# Patient Record
Sex: Male | Born: 1935 | Race: White | Hispanic: No | Smoking: Former smoker
Health system: Southern US, Community
[De-identification: ages and names within clinical notes are randomized; demographics above are authoritative.]

## PROBLEM LIST (undated history)

## (undated) DIAGNOSIS — I2510 Atherosclerotic heart disease of native coronary artery without angina pectoris: Secondary | ICD-10-CM

## (undated) DIAGNOSIS — Z8719 Personal history of other diseases of the digestive system: Secondary | ICD-10-CM

## (undated) DIAGNOSIS — E785 Hyperlipidemia, unspecified: Secondary | ICD-10-CM

## (undated) DIAGNOSIS — E039 Hypothyroidism, unspecified: Secondary | ICD-10-CM

## (undated) DIAGNOSIS — D649 Anemia, unspecified: Secondary | ICD-10-CM

## (undated) DIAGNOSIS — Q120 Congenital cataract: Secondary | ICD-10-CM

## (undated) DIAGNOSIS — C679 Malignant neoplasm of bladder, unspecified: Secondary | ICD-10-CM

## (undated) DIAGNOSIS — J439 Emphysema, unspecified: Secondary | ICD-10-CM

## (undated) DIAGNOSIS — I10 Essential (primary) hypertension: Secondary | ICD-10-CM

## (undated) HISTORY — PX: TURP VAPORIZATION: SUR1397

## (undated) HISTORY — DX: Hypothyroidism, unspecified: E03.9

## (undated) HISTORY — DX: Malignant neoplasm of bladder, unspecified: C67.9

## (undated) HISTORY — PX: BLADDER SURGERY: SHX569

## (undated) HISTORY — PX: TONSILLECTOMY: SUR1361

---

## 2006-07-13 ENCOUNTER — Ambulatory Visit: Payer: Self-pay | Admitting: Cardiology

## 2006-07-24 ENCOUNTER — Ambulatory Visit: Payer: Self-pay | Admitting: Cardiology

## 2006-10-03 ENCOUNTER — Ambulatory Visit: Payer: Self-pay | Admitting: Cardiology

## 2006-10-09 ENCOUNTER — Ambulatory Visit: Payer: Self-pay | Admitting: Cardiology

## 2006-10-15 ENCOUNTER — Inpatient Hospital Stay (HOSPITAL_BASED_OUTPATIENT_CLINIC_OR_DEPARTMENT_OTHER): Admission: RE | Admit: 2006-10-15 | Discharge: 2006-10-15 | Payer: Self-pay | Admitting: Cardiology

## 2006-10-15 ENCOUNTER — Ambulatory Visit: Payer: Self-pay | Admitting: Cardiology

## 2006-10-30 ENCOUNTER — Ambulatory Visit: Payer: Self-pay | Admitting: Cardiology

## 2006-11-20 HISTORY — PX: CARDIAC CATHETERIZATION: SHX172

## 2006-11-28 ENCOUNTER — Ambulatory Visit (HOSPITAL_COMMUNITY): Admission: RE | Admit: 2006-11-28 | Discharge: 2006-11-28 | Payer: Self-pay | Admitting: Cardiology

## 2006-11-28 ENCOUNTER — Ambulatory Visit: Payer: Self-pay | Admitting: Internal Medicine

## 2006-12-04 ENCOUNTER — Ambulatory Visit: Payer: Self-pay | Admitting: Cardiology

## 2007-01-22 ENCOUNTER — Ambulatory Visit: Payer: Self-pay | Admitting: Critical Care Medicine

## 2007-03-07 ENCOUNTER — Ambulatory Visit: Payer: Self-pay | Admitting: Critical Care Medicine

## 2007-08-23 ENCOUNTER — Encounter: Payer: Self-pay | Admitting: Cardiology

## 2007-10-10 DIAGNOSIS — J439 Emphysema, unspecified: Secondary | ICD-10-CM | POA: Insufficient documentation

## 2007-10-10 DIAGNOSIS — J438 Other emphysema: Secondary | ICD-10-CM | POA: Insufficient documentation

## 2008-01-01 ENCOUNTER — Ambulatory Visit: Payer: Self-pay | Admitting: Cardiology

## 2008-01-10 ENCOUNTER — Ambulatory Visit: Payer: Self-pay | Admitting: Critical Care Medicine

## 2008-01-10 DIAGNOSIS — E785 Hyperlipidemia, unspecified: Secondary | ICD-10-CM | POA: Insufficient documentation

## 2008-01-10 DIAGNOSIS — I2510 Atherosclerotic heart disease of native coronary artery without angina pectoris: Secondary | ICD-10-CM | POA: Insufficient documentation

## 2008-01-10 DIAGNOSIS — C679 Malignant neoplasm of bladder, unspecified: Secondary | ICD-10-CM | POA: Insufficient documentation

## 2008-01-24 ENCOUNTER — Ambulatory Visit: Payer: Self-pay | Admitting: Critical Care Medicine

## 2008-01-30 ENCOUNTER — Encounter: Payer: Self-pay | Admitting: Critical Care Medicine

## 2008-02-24 ENCOUNTER — Ambulatory Visit: Payer: Self-pay | Admitting: Critical Care Medicine

## 2008-08-25 ENCOUNTER — Ambulatory Visit: Payer: Self-pay | Admitting: Cardiology

## 2009-02-03 ENCOUNTER — Ambulatory Visit: Payer: Self-pay | Admitting: Critical Care Medicine

## 2009-03-18 ENCOUNTER — Ambulatory Visit: Payer: Self-pay | Admitting: Critical Care Medicine

## 2009-03-25 ENCOUNTER — Encounter: Payer: Self-pay | Admitting: Cardiology

## 2009-03-29 ENCOUNTER — Ambulatory Visit: Payer: Self-pay | Admitting: Cardiology

## 2009-06-16 ENCOUNTER — Ambulatory Visit: Payer: Self-pay | Admitting: Critical Care Medicine

## 2009-09-06 DIAGNOSIS — I10 Essential (primary) hypertension: Secondary | ICD-10-CM | POA: Insufficient documentation

## 2009-10-22 ENCOUNTER — Ambulatory Visit: Payer: Self-pay | Admitting: Critical Care Medicine

## 2009-10-25 ENCOUNTER — Encounter: Payer: Self-pay | Admitting: Critical Care Medicine

## 2009-10-27 ENCOUNTER — Encounter: Payer: Self-pay | Admitting: Cardiology

## 2010-02-02 ENCOUNTER — Ambulatory Visit: Payer: Self-pay | Admitting: Critical Care Medicine

## 2010-04-20 ENCOUNTER — Ambulatory Visit: Payer: Self-pay | Admitting: Cardiology

## 2010-04-20 DIAGNOSIS — B354 Tinea corporis: Secondary | ICD-10-CM | POA: Insufficient documentation

## 2010-06-06 ENCOUNTER — Ambulatory Visit: Payer: Self-pay | Admitting: Critical Care Medicine

## 2010-08-04 ENCOUNTER — Ambulatory Visit (HOSPITAL_COMMUNITY): Admission: RE | Admit: 2010-08-04 | Discharge: 2010-08-04 | Payer: Self-pay | Admitting: Urology

## 2010-12-02 ENCOUNTER — Ambulatory Visit
Admission: RE | Admit: 2010-12-02 | Discharge: 2010-12-02 | Payer: Self-pay | Source: Home / Self Care | Attending: Critical Care Medicine | Admitting: Critical Care Medicine

## 2010-12-20 NOTE — Assessment & Plan Note (Signed)
Summary: 1 YR FU -RECV REMINDER VS   Visit Type:  Follow-up Referring Provider:  [name] Primary Provider:  Leandrew Koyanagi   History of Present Illness: the patient is a 77 year old male with COPD and bladder cancer, nonobstructive coronary disease and normal LV function. The patient presents for followup. He denies any chest pain. He has chronic dyspneaGOLD III. he reports a rash on the chest in associated with itching. He had recent blood work done and reportedly his cholesterol is within normal limits. He reports no other cardiovascular symptoms  Bystolic was apparently switched to carvedilol.apparently the patient tolerated this change without any difficulty.  Preventive Screening-Counseling & Management  Alcohol-Tobacco     Smoking Status: quit     Year Quit: 1998  Current Medications (verified): 1)  Ventolin Hfa 108 (90 Base) Mcg/act Aers (Albuterol Sulfate) .... Inhale 2 Puff Every Four  Hours As Needed 2)  Levothroid 112 Mcg Tabs (Levothyroxine Sodium) .... Once Daily 3)  Lovastatin 20 Mg  Tabs (Lovastatin) .... One By Mouth Once Daily 4)  Adult Aspirin Low Strength 81 Mg  Tbdp (Aspirin) .... One By Mouth Once Daily 5)  Carvedilol 12.5 Mg Tabs (Carvedilol) .... One By Mouth Two Times A Day   Pt Stopping Bystolic 6)  Advair Diskus 250-50 Mcg/dose Misc (Fluticasone-Salmeterol) .Marland Kitchen.. 1 Puff Two Times A Day 7)  Amlodipine Besylate 5 Mg Tabs (Amlodipine Besylate) .... Take 1 Tablet By Mouth Once A Day 8)  Timolol Maleate 0.5 % Soln (Timolol Maleate) .... As Needed 9)  Pred Forte 1 % Susp (Prednisolone Acetate) .... As Needed 10)  Isopto Hyoscine 0.25 % Soln (Scopolamine Hbr) .... As Needed  Allergies: 1)  ! Penicillin  Comments:  Nurse/Medical Assistant: The patient's medications and allergies were reviewed with the patient and were updated in the Medication and Allergy Lists. List reviewed.  Past History:  Past Medical History: Last updated: 09/06/2009 HYPERTENSION,  UNSPECIFIED (ICD-401.9) DYSPNEA (ICD-786.05) ACUTE BRONCHITIS (ICD-466.0) OTHER ACUTE SINUSITIS (ICD-461.8) PNEUMONIA (ICD-486) BLADDER CANCER (ICD-188.9) HYPERLIPIDEMIA (ICD-272.4) CORONARY HEART DISEASE (ICD-414.00) EMPHYSEMA (ICD-492.8) C O P D (ICD-496) Hypothyroidism.  Past Surgical History: Last updated: 06/16/2009 Bladder Surgery 2008 TURP  2008  Family History: Last updated: 01/10/2008 non contrib  Social History: Last updated: 09/06/2009 Patient states former smoker.  Retired  Married   Social History: Smoking Status:  quit  Review of Systems       The patient complains of shortness of breath and prolonged cough.  The patient denies fatigue, malaise, fever, weight gain/loss, vision loss, decreased hearing, hoarseness, chest pain, palpitations, wheezing, sleep apnea, coughing up blood, abdominal pain, blood in stool, nausea, vomiting, diarrhea, heartburn, incontinence, blood in urine, muscle weakness, joint pain, leg swelling, rash, skin lesions, headache, fainting, dizziness, depression, anxiety, enlarged lymph nodes, easy bruising or bleeding, and environmental allergies.    Vital Signs:  Patient profile:   77 year old male Height:      67 inches Weight:      160 pounds Pulse rate:   67 / minute BP sitting:   120 / 69  (left arm) Cuff size:   regular  Vitals Entered By: Carlye Grippe (April 20, 2010 10:43 AM)  Physical Exam  Additional Exam:  General: Well-developed, well-nourished in no distress head: Normocephalic and atraumatic eyes PERRLA/EOMI intact, conjunctiva and lids normal nose: No deformity or lesions mouth normal dentition, normal posterior pharynx neck: Supple, no JVD.  No masses, thyromegaly or abnormal cervical nodes lungs: Normal breath sounds bilaterally without wheezing.  Normal percussion heart: regular rate and rhythm with normal S1 and S2, no S3 or S4.  PMI is normal.  No pathological murmurs abdomen: Normal bowel sounds, abdomen  is soft and nontender without masses, organomegaly or hernias noted.  No hepatosplenomegaly musculoskeletal: Back normal, normal gait muscle strength and tone normal pulsus: Pulse is normal in all 4 extremities Extremities: No peripheral pitting edema neurologic: Alert and oriented x 3 skin: Norlene Duel consistent with dermatophytosis on the chest cervical nodes: No significant adenopathy psychologic: Normal affect    EKG  Procedure date:  04/20/2010  Findings:      normal sinus rhythm. Left anterior hemiblock. Heart rate 65 beats per minute  Impression & Recommendations:  Problem # 1:  HYPERTENSION, UNSPECIFIED (ICD-401.9) Assessment Comment Only  His updated medication list for this problem includes:    Adult Aspirin Low Strength 81 Mg Tbdp (Aspirin) ..... One by mouth once daily    Carvedilol 12.5 Mg Tabs (Carvedilol) ..... One by mouth two times a day   pt stopping bystolic    Amlodipine Besylate 5 Mg Tabs (Amlodipine besylate) .Marland Kitchen... Take 1 tablet by mouth once a day  Problem # 2:  DYSPNEA (ICD-786.05) the patient's chronic COPD with frequent exacerbations he is followed by pulmonary His updated medication list for this problem includes:    Adult Aspirin Low Strength 81 Mg Tbdp (Aspirin) ..... One by mouth once daily    Carvedilol 12.5 Mg Tabs (Carvedilol) ..... One by mouth two times a day   pt stopping bystolic    Amlodipine Besylate 5 Mg Tabs (Amlodipine besylate) .Marland Kitchen... Take 1 tablet by mouth once a day  Problem # 3:  CORONARY HEART DISEASE (ICD-414.00) the patient has nonobstructive coronary artery disease and reports no chest pain. No indication for ischemia testing. His updated medication list for this problem includes:    Adult Aspirin Low Strength 81 Mg Tbdp (Aspirin) ..... One by mouth once daily    Carvedilol 12.5 Mg Tabs (Carvedilol) ..... One by mouth two times a day   pt stopping bystolic    Amlodipine Besylate 5 Mg Tabs (Amlodipine besylate) .Marland Kitchen... Take 1 tablet by  mouth once a day  Orders: EKG w/ Interpretation (93000)  Problem # 4:  TINEA CORPORIS (ICD-110.5) prescribed Tinactin powder  Patient Instructions: 1)  Your physician recommends that you continue on your current medications as directed. Please refer to the Current Medication list given to you today. 2)  Follow up in  1 year

## 2010-12-20 NOTE — Assessment & Plan Note (Signed)
Summary: Pulmonary OV   Copy to:  [name] Primary Provider/Referring Provider:  Leandrew Koyanagi  CC:  4 month follow up.  Pt states breathing is the same since last OV.  States he is still having SOB with exertion but this is no worse.  Occ wheezing and occ prod cough with clear mucus.  .  History of Present Illness: This is a 77 year old, white male with history of chronic obstructive lung disease.   He has had bladder cancer with tumors removed from the bladder.  Prostatic hypertrophy surgery is also been performed.  Both of these surgeries were done  at [place],  [place] by urology at that site.    June 06, 2010 12:02 PM No real change since march.  Dyspnea is the same.  No new issues. Pt denies any significant sore throat, nasal congestion or excess secretions, fever, chills, sweats, unintended weight loss, pleurtic or exertional chest pain, orthopnea PND, or leg swelling Pt denies any increase in rescue therapy over baseline, denies waking up needing it or having any early am or nocturnal exacerbations of coughing/wheezing/or dyspnea.   Preventive Screening-Counseling & Management  Alcohol-Tobacco     Smoking Status: quit     Packs/Day: 30     Year Quit: 1998     Pack years: 13yrs, 1 1/2ppd  Current Medications (verified): 1)  Ventolin Hfa 108 (90 Base) Mcg/act Aers (Albuterol Sulfate) .... Inhale 2 Puff Every Four  Hours As Needed 2)  Levothroid 112 Mcg Tabs (Levothyroxine Sodium) .... Once Daily 3)  Lovastatin 20 Mg  Tabs (Lovastatin) .... One By Mouth Once Daily 4)  Adult Aspirin Low Strength 81 Mg  Tbdp (Aspirin) .... One By Mouth Once Daily 5)  Carvedilol 12.5 Mg Tabs (Carvedilol) .... One By Mouth Two Times A Day   Pt Stopping Bystolic 6)  Advair Diskus 250-50 Mcg/dose Misc (Fluticasone-Salmeterol) .Marland Kitchen.. 1 Puff Two Times A Day 7)  Amlodipine Besylate 5 Mg Tabs (Amlodipine Besylate) .... Take 1 Tablet By Mouth Once A Day 8)  Timolol Maleate 0.5 % Soln (Timolol Maleate) ....  As Needed 9)  Pred Forte 1 % Susp (Prednisolone Acetate) .... As Needed 10)  Isopto Hyoscine 0.25 % Soln (Scopolamine Hbr) .... As Needed  Allergies (verified): 1)  ! Penicillin  Past History:  Past medical, surgical, family and social histories (including risk factors) reviewed, and no changes noted (except as noted below).  Past Medical History: Reviewed history from 09/06/2009 and no changes required. HYPERTENSION, UNSPECIFIED (ICD-401.9) DYSPNEA (ICD-786.05) ACUTE BRONCHITIS (ICD-466.0) OTHER ACUTE SINUSITIS (ICD-461.8) PNEUMONIA (ICD-486) BLADDER CANCER (ICD-188.9) HYPERLIPIDEMIA (ICD-272.4) CORONARY HEART DISEASE (ICD-414.00) EMPHYSEMA (ICD-492.8) C O P D (ICD-496) Hypothyroidism.  Past Surgical History: Reviewed history from 06/16/2009 and no changes required. Bladder Surgery 2008 TURP  2008  Family History: Reviewed history from 01/10/2008 and no changes required. non contrib  Social History: Reviewed history from 09/06/2009 and no changes required. Patient states former smoker.  Retired  Married   Review of Systems       The patient complains of shortness of breath with activity.  The patient denies shortness of breath at rest, productive cough, non-productive cough, coughing up blood, chest pain, irregular heartbeats, acid heartburn, indigestion, loss of appetite, weight change, abdominal pain, difficulty swallowing, sore throat, tooth/dental problems, headaches, nasal congestion/difficulty breathing through nose, sneezing, itching, ear ache, anxiety, depression, hand/feet swelling, joint stiffness or pain, rash, change in color of mucus, and fever.    Vital Signs:  Patient profile:   77 year  old male Height:      66 inches Weight:      163.25 pounds BMI:     26.44 O2 Sat:      92 % on Room air Temp:     98.3 degrees F oral Pulse rate:   86 / minute BP sitting:   104 / 82  (right arm) Cuff size:   regular  Vitals Entered By: Gweneth Dimitri RN (June 06, 2010 11:42 AM)  O2 Flow:  Room air CC: 4 month follow up.  Pt states breathing is the same since last OV.  States he is still having SOB with exertion but this is no worse.  Occ wheezing and occ prod cough with clear mucus.   Comments Medications reviewed with patient Daytime contact number verified with patient. Gweneth Dimitri RN  June 06, 2010 11:44 AM    Physical Exam  Additional Exam:  Gen: Pleasant, well-nourished, in no distress , normal affect ENT: no lesions, no post nasal drip Neck: No JVD, no TMG, no carotid bruits Lungs: No use of accessory muscles, no dullness to percussion, distant BS poor airflow Cardiovascular: RRR, heart sounds normal, no murmurs or gallops, no peripheral edema Abdomen: soft and non-tender, no HSM, BS normal Musculoskeletal: No deformities, no cyanosis or clubbing Neuro: alert, non-focal     Impression & Recommendations:  Problem # 1:  C O P D (ICD-496) Assessment Unchanged  Stable COPD Golds stage III, no change with bystolic vs coreg in dypsnea  plan cont coreg No change in inhaled medications.   Maintain treatment program as currently prescribed.  Medications Added to Medication List This Visit: 1)  Proventil Hfa 108 (90 Base) Mcg/act Aers (Albuterol sulfate) .Marland Kitchen.. 1-2 puffs every 4-6 hours as needed  Complete Medication List: 1)  Proventil Hfa 108 (90 Base) Mcg/act Aers (Albuterol sulfate) .Marland Kitchen.. 1-2 puffs every 4-6 hours as needed 2)  Levothroid 112 Mcg Tabs (Levothyroxine sodium) .... Once daily 3)  Lovastatin 20 Mg Tabs (Lovastatin) .... One by mouth once daily 4)  Adult Aspirin Low Strength 81 Mg Tbdp (Aspirin) .... One by mouth once daily 5)  Carvedilol 12.5 Mg Tabs (Carvedilol) .... One by mouth two times a day   pt stopping bystolic 6)  Advair Diskus 250-50 Mcg/dose Misc (Fluticasone-salmeterol) .Marland Kitchen.. 1 puff two times a day 7)  Amlodipine Besylate 5 Mg Tabs (Amlodipine besylate) .... Take 1 tablet by mouth once a day 8)  Timolol  Maleate 0.5 % Soln (Timolol maleate) .... As needed 9)  Pred Forte 1 % Susp (Prednisolone acetate) .... As needed 10)  Isopto Hyoscine 0.25 % Soln (Scopolamine hbr) .... As needed  Other Orders: Est. Patient Level III (30865)  Patient Instructions: 1)  No change in medications 2)  Return in     6     months Prescriptions: PROVENTIL HFA 108 (90 BASE) MCG/ACT  AERS (ALBUTEROL SULFATE) 1-2 puffs every 4-6 hours as needed  #1 x 6   Entered and Authorized by:   Storm Frisk MD   Signed by:   Storm Frisk MD on 06/06/2010   Method used:   Electronically to        [facility]  [address] [address] [id number] (retail)       [address]       [place], [place]  [address]       Ph: [phone] or [phone]       Fax:  [phone]   RxID:   [id number] ADVAIR DISKUS 250-50 MCG/DOSE MISC (FLUTICASONE-SALMETEROL) 1 puff two times a day  #1 x 6   Entered and Authorized by:   Storm Frisk MD   Signed by:   Storm Frisk MD on 06/06/2010   Method used:   Electronically to        [facility]  [address] [address] [id number] (retail)       [address]       [place], [place]  [address]       Ph: [phone] or [phone]       Fax: [phone]   RxID:   [id number]     Appended Document: Pulmonary OV fax Dr Leandrew Koyanagi

## 2010-12-20 NOTE — Assessment & Plan Note (Signed)
Summary: Pulmonary OV   Copy to:  Willie Williamson Primary Provider/Referring Provider:  [facility]  CC:  COPD. 2 mo follow-up. The patient says there is no change in his breathing. He has sob with exertion but no worse. He c/o a cough with yellow mucus x2-3 days.Willie Williamson  History of Present Illness: This is a 77 year old, white male with history of chronic obstructive lung disease.   He has had bladder cancer with tumors removed from the bladder.  Prostatic hypertrophy surgery is also been performed.  Both of these surgeries were done  at [place],  [place] by urology at that site.    February 02, 2010 10:51 AM The states when he tried bystolic was no difference is dyspnea The pt is on advair  The pt notes congestion last few days with yellow green tint. No other new issues.      Preventive Screening-Counseling & Management  Alcohol-Tobacco     Smoking Status: quit > 6 months  Current Medications (verified): 1)  Ventolin Hfa 108 (90 Base) Mcg/act Aers (Albuterol Sulfate) .... Inhale 2 Puff Every Four  Hours As Needed 2)  Levothroid 112 Mcg Tabs (Levothyroxine Sodium) .... Once Daily 3)  Amlodipine Besylate 5 Mg Tabs (Amlodipine Besylate) .... One By Mouth Daily 4)  Lovastatin 20 Mg  Tabs (Lovastatin) .... One By Mouth Once Daily 5)  Adult Aspirin Low Strength 81 Mg  Tbdp (Aspirin) .... One By Mouth Once Daily 6)  Bystolic 10 Mg  Tabs (Nebivolol Hcl) .... One Tablet Daily 7)  Advair Diskus 250-50 Mcg/dose Misc (Fluticasone-Salmeterol) .Willie Williamson.. 1 Puff Two Times A Day  Allergies (verified): 1)  ! Penicillin  Past History:  Past medical, surgical, family and social histories (including risk factors) reviewed, and no changes noted (except as noted below).  Past Medical History: Reviewed history from 09/06/2009 and no changes required. HYPERTENSION, UNSPECIFIED (ICD-401.9) DYSPNEA (ICD-786.05) ACUTE BRONCHITIS (ICD-466.0) OTHER ACUTE SINUSITIS (ICD-461.8) PNEUMONIA  (ICD-486) BLADDER CANCER (ICD-188.9) HYPERLIPIDEMIA (ICD-272.4) CORONARY HEART DISEASE (ICD-414.00) EMPHYSEMA (ICD-492.8) C O P D (ICD-496) Hypothyroidism.  Past Surgical History: Reviewed history from 06/16/2009 and no changes required. Bladder Surgery 2008 TURP  2008  Family History: Reviewed history from 01/10/2008 and no changes required. non contrib  Social History: Reviewed history from 09/06/2009 and no changes required. Patient states former smoker.  Retired  Married   Review of Systems       The patient complains of shortness of breath with activity and non-productive cough.  The patient denies shortness of breath at rest, productive cough, coughing up blood, chest pain, irregular heartbeats, acid heartburn, indigestion, loss of appetite, weight change, abdominal pain, difficulty swallowing, sore throat, tooth/dental problems, headaches, nasal congestion/difficulty breathing through nose, sneezing, itching, ear ache, anxiety, depression, hand/feet swelling, joint stiffness or pain, rash, change in color of mucus, and fever.    Vital Signs:  Patient profile:   77 year old male Height:      67 inches (170.18 cm) Weight:      165 pounds (75 kg) BMI:     25.94 O2 Sat:      96 % on Room air Temp:     97.7 degrees F (36.50 degrees C) oral Pulse rate:   85 / minute BP sitting:   130 / 74  (left arm) Cuff size:   regular  Vitals Entered By: Michel Bickers CMA (February 02, 2010 10:41 AM)  O2 Sat at Rest %:  96 O2 Flow:  Room air CC: COPD. 2 mo  follow-up. The patient says there is no change in his breathing. He has sob with exertion but no worse. He c/o a cough with yellow mucus x2-3 days. Comments Medications and phone number reviewed with the patient. Michel Bickers CMA  February 02, 2010 10:42 AM   Physical Exam  Additional Exam:  Gen: Pleasant, well-nourished, in no distress , normal affect ENT: no lesions, no post nasal drip Neck: No JVD, no TMG, no carotid bruits Lungs:  No use of accessory muscles, no dullness to percussion, distant BS poor airflow Cardiovascular: RRR, heart sounds normal, no murmurs or gallops, no peripheral edema Abdomen: soft and non-tender, no HSM, BS normal Musculoskeletal: No deformities, no cyanosis or clubbing Neuro: alert, non-focal     Impression & Recommendations:  Problem # 1:  C O P D (ICD-496) Assessment Unchanged  Stable COPD Golds stage III, no change with bystolic vs coreg in dypsnea,  mild tracheobronchitis   plan Take doxycycline one twice daily for 7 days Stop bystolic when current supply is out and switch back to coreg one twice daily No change in advair Return 4months  Medications Added to Medication List This Visit: 1)  Carvedilol 12.5 Mg Tabs (Carvedilol) .... One by mouth two times a day   pt stopping bystolic 2)  Doxycycline Hyclate 100 Mg Caps (Doxycycline hyclate) .... One by mouth two times a day  Complete Medication List: 1)  Ventolin Hfa 108 (90 Base) Mcg/act Aers (Albuterol sulfate) .... Inhale 2 puff every four  hours as needed 2)  Levothroid 112 Mcg Tabs (Levothyroxine sodium) .... Once daily 3)  Lovastatin 20 Mg Tabs (Lovastatin) .... One by mouth once daily 4)  Adult Aspirin Low Strength 81 Mg Tbdp (Aspirin) .... One by mouth once daily 5)  Carvedilol 12.5 Mg Tabs (Carvedilol) .... One by mouth two times a day   pt stopping bystolic 6)  Advair Diskus 250-50 Mcg/dose Misc (Fluticasone-salmeterol) .Willie Williamson.. 1 puff two times a day 7)  Doxycycline Hyclate 100 Mg Caps (Doxycycline hyclate) .... One by mouth two times a day  Other Orders: Est. Patient Level IV (36144)  Patient Instructions: 1)  Take doxycycline one twice daily for 7 days 2)  Stop bystolic when current supply is out and switch back to coreg one twice daily 3)  No change in advair 4)  Return 4months Prescriptions: ADVAIR DISKUS 250-50 MCG/DOSE MISC (FLUTICASONE-SALMETEROL) 1 puff two times a day  #1 x 6   Entered and Authorized by:    Storm Frisk MD   Signed by:   Storm Frisk MD on 02/02/2010   Method used:   Electronically to        [facility] [phone]* (retail)       [address] [place], [place]  [address]       Ph: [phone] or [phone]       Fax: [phone]   RxID:   [id number] DOXYCYCLINE HYCLATE 100 MG CAPS (DOXYCYCLINE HYCLATE) one by mouth two times a day  #14 x 0   Entered and Authorized by:   Storm Frisk MD   Signed by:   Storm Frisk MD on 02/02/2010   Method used:   Electronically to        [facility] [phone]* (retail)       [address] [place]  [place], [place]  [address]       Ph: [phone] or [phone]       Fax: [phone]   RxID:   [id number] CARVEDILOL 12.5 MG TABS (CARVEDILOL) One by mouth two times a day   pt stopping bystolic  #60 x 6   Entered and Authorized by:   Storm Frisk MD   Signed by:   Storm Frisk MD on 02/02/2010   Method used:   Electronically to        [facility] [phone]* (retail)       [address] [place], [place]  [address]       Ph: [phone] or [phone]       Fax: [phone]   RxID:   [id number]     Appended Document: Pulmonary OV fax [facility]

## 2010-12-22 NOTE — Assessment & Plan Note (Signed)
Summary: Pulmonary OV   Copy to:  [name], Leanna Battles Eye MD Primary Provider/Referring Provider:  Leandrew Koyanagi  CC:  6 month follow up.  Pt states he does have SOB with exertion but no worse from last OV.  Occas wheezing and occas prod cough with clear mucus..  History of Present Illness: This is a 77 year old, white male with history of chronic obstructive lung disease.   He has had bladder cancer with tumors removed from the bladder.  Prostatic hypertrophy surgery is also been performed.  Both of these surgeries were done  at [place],  [place] by urology at that site.    June 06, 2010 12:02 PM No real change since march.  Dyspnea is the same.  No new issues. Pt denies any significant sore throat, nasal congestion or excess secretions, fever, chills, sweats, unintended weight loss, pleurtic or exertional chest pain, orthopnea PND, or leg swelling Pt denies any increase in rescue therapy over baseline, denies waking up needing it or having any early am or nocturnal exacerbations of coughing/wheezing/or dyspnea.   December 02, 2010 9:51 AM Dyspnea the same.  Has sl congestion for 2-3 days.  No real nasal drip.  No chest pain.   Using timolol as needed.  will use 2-3 days in a row every 2-3 weeks for eye pain. eye [facility]   Current Medications (verified): 1)  Proventil Hfa 108 (90 Base) Mcg/act  Aers (Albuterol Sulfate) .Marland Kitchen.. 1-2 Puffs Every 4-6 Hours As Needed 2)  Levothroid 112 Mcg Tabs (Levothyroxine Sodium) .... Once Daily 3)  Lovastatin 20 Mg  Tabs (Lovastatin) .... One By Mouth Once Daily 4)  Adult Aspirin Low Strength 81 Mg  Tbdp (Aspirin) .... One By Mouth Once Daily 5)  Carvedilol 12.5 Mg Tabs (Carvedilol) .... One By Mouth Two Times A Day   Pt Stopping Bystolic 6)  Advair Diskus 250-50 Mcg/dose Misc (Fluticasone-Salmeterol) .Marland Kitchen.. 1 Puff Two Times A Day 7)  Amlodipine Besylate 5 Mg Tabs (Amlodipine Besylate) .... Take 1 Tablet By Mouth Once A Day 8)  Timolol  Maleate 0.5 % Soln (Timolol Maleate) .... As Needed 9)  Isopto Hyoscine 0.25 % Soln (Scopolamine Hbr) .... As Needed  Allergies (verified): 1)  ! Penicillin  Past History:  Past medical, surgical, family and social histories (including risk factors) reviewed, and no changes noted (except as noted below).  Past Medical History: Reviewed history from 09/06/2009 and no changes required. HYPERTENSION, UNSPECIFIED (ICD-401.9) DYSPNEA (ICD-786.05) ACUTE BRONCHITIS (ICD-466.0) OTHER ACUTE SINUSITIS (ICD-461.8) PNEUMONIA (ICD-486) BLADDER CANCER (ICD-188.9) HYPERLIPIDEMIA (ICD-272.4) CORONARY HEART DISEASE (ICD-414.00) EMPHYSEMA (ICD-492.8) C O P D (ICD-496) Hypothyroidism.  Past Surgical History: Reviewed history from 06/16/2009 and no changes required. Bladder Surgery 2008 TURP  2008  Family History: Reviewed history from 01/10/2008 and no changes required. non contrib  Social History: Reviewed history from 09/06/2009 and no changes required. Patient states former smoker.  Retired  Married   Review of Systems       The patient complains of shortness of breath with activity.  The patient denies shortness of breath at rest, productive cough, non-productive cough, coughing up blood, chest pain, irregular heartbeats, acid heartburn, indigestion, loss of appetite, weight change, abdominal pain, difficulty swallowing, sore throat, tooth/dental problems, headaches, nasal congestion/difficulty breathing through nose, sneezing, itching, ear ache, anxiety, depression, hand/feet swelling, joint stiffness or pain, rash, change in color of mucus, and fever.    Vital Signs:  Patient profile:   77 year old male Height:  67 inches Weight:      160 pounds BMI:     25.15 O2 Sat:      95 % on Room air Temp:     98.1 degrees F oral Pulse rate:   68 / minute BP sitting:   140 / 70  (left arm) Cuff size:   regular  Vitals Entered By: Gweneth Dimitri RN (December 02, 2010 9:36 AM)  O2  Flow:  Room air CC: 6 month follow up.  Pt states he does have SOB with exertion but no worse from last OV.  Occas wheezing and occas prod cough with clear mucus. Comments Medications reviewed with patient Daytime contact number verified with patient. Gweneth Dimitri RN  December 02, 2010 9:37 AM    Physical Exam  Additional Exam:  Gen: Pleasant, well-nourished, in no distress , normal affect ENT: no lesions, no post nasal drip Neck: No JVD, no TMG, no carotid bruits Lungs: No use of accessory muscles, no dullness to percussion, distant BS poor airflow Cardiovascular: RRR, heart sounds normal, no murmurs or gallops, no peripheral edema Abdomen: soft and non-tender, no HSM, BS normal Musculoskeletal: No deformities, no cyanosis or clubbing Neuro: alert, non-focal     Impression & Recommendations:  Problem # 1:  EMPHYSEMA (ICD-492.8) Assessment Unchanged stable copd primary emphysema would like to see the pt stop using timolol due to bronchospasm plan  alert Eye MD re timolol issue No change in inhaled medications.   Maintain treatment program as currently prescribed.  Orders: Est. Patient Level III (46962)  Complete Medication List: 1)  Proventil Hfa 108 (90 Base) Mcg/act Aers (Albuterol sulfate) .Marland Kitchen.. 1-2 puffs every 4-6 hours as needed 2)  Levothroid 112 Mcg Tabs (Levothyroxine sodium) .... Once daily 3)  Lovastatin 20 Mg Tabs (Lovastatin) .... One by mouth once daily 4)  Adult Aspirin Low Strength 81 Mg Tbdp (Aspirin) .... One by mouth once daily 5)  Carvedilol 12.5 Mg Tabs (Carvedilol) .... One by mouth two times a day   pt stopping bystolic 6)  Advair Diskus 250-50 Mcg/dose Misc (Fluticasone-salmeterol) .Marland Kitchen.. 1 puff two times a day 7)  Amlodipine Besylate 5 Mg Tabs (Amlodipine besylate) .... Take 1 tablet by mouth once a day 8)  Timolol Maleate 0.5 % Soln (Timolol maleate) .... As needed 9)  Isopto Hyoscine 0.25 % Soln (Scopolamine hbr) .... As needed  Patient  Instructions: 1)  No change in medications 2)  Return in      6    months 3)  I will ask your eye MD to change timolol to a different eye drop due to lung disease  Prescriptions: ADVAIR DISKUS 250-50 MCG/DOSE MISC (FLUTICASONE-SALMETEROL) 1 puff two times a day  #1 x 6   Entered and Authorized by:   Storm Frisk MD   Signed by:   Storm Frisk MD on 12/02/2010   Method used:   Electronically to        [facility]  [address] [address] [id number] (retail)       [address]       [place], [place]  [address]       Ph: [phone] or [phone]       Fax: [phone]   RxID:   [id number]    Immunization History:  Influenza Immunization History:    Influenza:  historical (08/20/2010)  Pneumovax Immunization History:    Pneumovax:  historical (08/20/2010)   Prevention & Chronic Care  Immunizations   Influenza vaccine: Historical  (08/20/2010)    Tetanus booster: Not documented    Pneumococcal vaccine: Historical  (08/20/2010)    H. zoster vaccine: Not documented  Colorectal Screening   Hemoccult: Not documented    Colonoscopy: Not documented  Other Screening   PSA: Not documented   Smoking status: quit  (06/06/2010)  Lipids   Total Cholesterol: Not documented   LDL: Not documented   LDL Direct: Not documented   HDL: Not documented   Triglycerides: Not documented    SGOT (AST): Not documented   SGPT (ALT): Not documented   Alkaline phosphatase: Not documented   Total bilirubin: Not documented  Hypertension   Last Blood Pressure: 140 / 70  (12/02/2010)   Serum creatinine: Not documented   Serum potassium Not documented  Self-Management Support :    Hypertension self-management support: Not documented    Lipid self-management support: Not documented     Appended Document: Pulmonary OV fax Leanna Battles [place]

## 2011-04-04 NOTE — Assessment & Plan Note (Signed)
[facility]                          [facility]   JAMERIUS, BOECKMAN                       MRN:          [id number]  DATE:03/29/2009                            DOB:          1931/01/28    HISTORY OF PRESENT ILLNESS:  The patient is a 77 year old male with  history of chronic obstructive pulmonary disease with frequent  exacerbations.  The patient is followed closely by Dr. Delford Field.  He  recently required a course of antibiotics and was doing better.  However, he now has recurrent symptoms and went to his primary care  doctor and received steroids.  He denies, however, any chest pain.  He  has chronic exertional dyspnea related to his COPD.  Last  catheterization was done several years ago, which showed nonobstructive  coronary artery disease.  He also had a stress test in 2007, which led  to the cardiac catheterization.   MEDICATIONS:  1. Aspirin 81 mg p.o. daily.  2. Advair Diskus.  3. Isoptin.  4. Unasyn.  5. Coreg 25 mg p.o. b.i.d.  6. Levothyroxine 112 mcg p.o. daily.  7. Lovastatin 20 mg p.o. daily.  8. Amlodipine 5 mg p.o. daily.  9. Spiriva.  10.Flonase p.r.n.   PHYSICAL EXAMINATION:  VITAL SIGNS:  Blood pressure 150/91, heart rate  at 77.  I retook the patient's blood pressure and it was 138/78.  Respirations 20, weight 163 pounds.  NECK:  Normal carotid upstroke and no carotid bruits.  No thyromegaly.  LUNGS:  Markedly diminished breath sounds bilaterally with few scattered  rhonchi.  HEART:  Regular rate and rhythm with normal S1 and S2.  No murmur, rubs,  or gallops.  ABDOMEN:  Soft, nontender.  No rebound or guarding and good bowel  sounds.  EXTREMITIES:  No cyanosis, clubbing, or edema.  NEUROLOGIC:  Patient is alert and oriented and grossly nonfocal.   PROBLEMS LIST:  1. Chronic dyspnea secondary to chronic obstructive pulmonary      disease/emphysema.  2. Nonobstructive coronary disease, asymptomatic  (catheterization      2007).  3. Normal left ventricular function.  4. Dyslipidemia.  5. Hypothyroidism.  6. Hypertension (controlled on retake of blood pressure).   PLAN:  1. From cardiac standpoint, the patient is doing well.  We reviewed      his electrocardiogram and there have been no new changes.  2. The patient also reports no evidence of unstable angina.  Continue      on his current medical      regimen.  3. We will see the patient back in 1 year.     Learta Codding, MD,FACC  Electronically Signed    GED/MedQ  DD: 03/29/2009  DT: 03/30/2009  Job #: [id number]

## 2011-04-04 NOTE — Assessment & Plan Note (Signed)
[facility]                          [facility] NOTE   THELMER, LEGLER                       MRN:          [id number]  DATE:01/01/2008                            DOB:          [birth year]    REFERRING PHYSICIAN:  Dr. @ [facility] Toni Arthurs   HISTORY OF PRESENT ILLNESS:  The patient is a 76-year-over male with  history of exertional dyspnea predominantly secondary to chronic  obstructive pulmonary disease.  The patient has moderate nonobstructive  coronary artery disease.  He presents for followup.  He reported a  couple months ago myalgias and was taken off lovastatin. However, since  discontinuation, this has not improved.  The patient denies any  substernal chest pain.  He is an NYHA Class 2B. He denies any orthopnea,  PND.  Has no palpitations or syncope.   MEDICATIONS:  1. Isosorbide mononitrate 30 mg p.o. daily.  2. Coreg 6.25 mg p.o. b.i.d.  3. Advair diskus.  4. Levothyroxine.   PHYSICAL EXAMINATION:  VITAL SIGNS:  Blood pressure 156/96, heart rate  is 68 beats per minutes, weight 160 pounds.  NECK: Normal carotid stroke and no carotid bruits.  LUNGS:  Clear.  LUNGS:  Diminished breath sounds bilaterally.  HEART:  Regular rate and rhythm.  Normal S1, S2. No murmurs, rubs or  gallops.  ABDOMEN:  Soft.  EXTREMITIES:  No cyanosis, clubbing or edema.  NEURO:  Patient alert and grossly nonfocal.   PROBLEMS:  1. Exertional dyspnea.      a.     Chronic obstructive pulmonary disease/emphysema.      b.     Followed by Dr. Delford Field.  2. Nonobstructive coronary artery disease.  3. Myalgias, not improved with discontinuation of statin drug therapy.  4. Normal left ventricular function.  5. Dyslipidemia.  6. Hypothyroidism.  7. Hypertension, poorly controlled.   PLAN:  1. I have increased the patient's Coreg to 12.5 mg p.o. b.i.d. for      improved control of his blood pressure and asked him to follow this  closely and we may need additional adjustment of medications.  2. The patient was also advised to restart aspirin as well as      lovastatin as the latter does not      appear to be related to his myalgias.  3. From a cardiac standpoint, the patient is otherwise stable and can      follow up with [place] in 6 months.     Learta Codding, MD,FACC  Electronically Signed    GED/MedQ  DD: 01/01/2008  DT: 01/02/2008  Job #: [id number]

## 2011-04-04 NOTE — Assessment & Plan Note (Signed)
[facility]                          [facility]   ILIAN, Willie Williamson                       MRN:          [id number]  DATE:08/25/2008                            DOB:          1930-11-27    HISTORY OF PRESENT ILLNESS:  The patient is a 77 year old male with a  history of exertional dyspnea followed closely by Dr. Delford Field secondary  to chronic obstructive pulmonary disease.  The patient has moderate  nonobstructive coronary artery disease.  He has not had any recent chest  pain, orthopnea or PND, palpitations or syncope.  He has not used any  p.r.n. nitroglycerin.  He is an NYHA class IIB.  He has noticed that his  blood pressure is somewhat uncontrolled.   MEDICATIONS:  1. Aspirin 81 mg a day.  2. Advair Diskus.  3. Coreg 12.5 b.i.d.  4. Levothyroxine 112 mcg p.o. daily.  5. Lovastatin 20 mg p.o. daily.   PHYSICAL EXAMINATION:  VITAL SIGNS:  Blood pressure 142/83, on retake  145/78, heart rate 63, weight 167 pounds.  NECK:  Normal carotid upstroke and no carotid bruits.  LUNGS:  Diminished breath sounds bilaterally with no wheezing.  HEART:  Regular rate and rhythm with normal S1 and S2, and no S3.  ABDOMEN:  Soft, nontender.  No rebound or guarding.  Good bowel sounds.  EXTREMITIES:  No cyanosis, clubbing, or edema.  NEUROLOGICAL:  The patient is alert, oriented, and grossly nonfocal.   PROBLEMS:  1. Exertional dyspnea chronic.      a.     Chronic obstructive pulmonary disease/emphysema.      b.     Followed by Dr. Delford Field.  2. Nonobstructive coronary artery disease, asymptomatic.  3. Myalgias not improved with discontinuation of statin drug therapy.  4. Normal left ventricular function.  5. Dyslipidemia.  6. Hypothyroidism.  7. Hypertension, poorly controlled.   PLAN:  I have talked to the patient to discontinue his Imdur and switch  to Norvasc  __________ drug therapy at the same time as an  antihypertensive medication.   The patient will also follow up  with Dr. Delford Field as his main issues remain dyspnea which is related to  his COPD.  The patient can see [place] back in 6 months.     Learta Codding, MD,FACC  Electronically Signed    GED/MedQ  DD: 08/25/2008  DT: 08/26/2008  Job #: [id number]   cc:   [facility]

## 2011-04-06 ENCOUNTER — Other Ambulatory Visit: Payer: Self-pay | Admitting: Critical Care Medicine

## 2011-04-07 NOTE — Assessment & Plan Note (Signed)
[facility]                          [facility]   Willie Williamson, Willie Williamson                       MRN:          [id number]  DATE:10/30/2006                            DOB:          [birth year]    PRIMARY CARDIOLOGIST:  Learta Codding, MD, [facility].   REASON FOR VISIT:  Post- catheterization followup.   Willie Williamson is a 77 year old male, recently referred to Dr. Andee Lineman here  in the clinic for evaluation of exertional dyspnea.  He was initially  referred for an Adenosine stress Cardiolite which was suggestive of  underlying coronary artery disease.  Left ventricular function, however,  was normal.   The patient was started on Imdur, Coreg and Lasix and was then referred  for elective cardiac catheterization in the [facility] lab.  He now presents in  followup.  Catheterization was performed by Dr. Antoine Poche who noted  moderate, nonobstructive coronary artery disease with diffuse  calcification of the arteries.  With respect to the recent stress test,  he noted no high-grade RCA lesion that would respond to the Cardiolite  results.  He also noted normal wall motion and overall preserved LV  function (EF 65%).   Continued aggressive risk factor modification was recommended.   Additionally, the patient was also set up for a 6-minute walk test per  Dr. Andee Lineman, and this revealed 94% sats at rest and 91% after walking.  The patient was noted to still be able to speak in sentences following  his walk test.   The patient reports to me today he has had no complications of the right  groin incision site.  He does, however, continue to have significant  exertional dyspnea with activity or walking uphill.  He denies no  associated chest discomfort.   CURRENT MEDICATIONS:  1. Lovastatin 20 daily.  2. Aspirin 81 daily.  3. Levothyroxine 0.075 daily.  4. Lasix 10 daily.  5. Isosorbide 30 daily.  6. Coreg 6.25 b.i.d.   PHYSICAL EXAMINATION:  Blood pressure  128/82, pulse 64, regular, weight  157.  GENERAL:  77 year old male, somewhat frail appearing, sitting upright in  no apparent distress.  HEENT:  Normocephalic and atraumatic.  NECK:  Palpable bilateral carotid pulses without bruits.  LUNGS:  Diminished breath sounds at bases but without crackles or  wheezes.  HEART:  Regular rate and rhythm (S1, S2) with markedly diminished heart  sounds.  ABDOMEN:  Soft, nontender.  EXTREMITIES:  Right groin stable with no ecchymosis, hematoma, or bruit  on auscultation.  Intact femoral and distal pulse.   IMPRESSION:  1. Persistent exertional dyspnea.      a.     Suspect primary pulmonary etiology.  2. Nonobstructive coronary artery disease.      a.     By recent cardiac catheterization.      b.     False positive Adenosine stress Cardiolite.  3. Normal left ventricular function.  4. COPD/emphysema.      a.     History of tobacco.  5. Hyperlipidemia.      a.     Followed by Dr.  Howard.  6. Hypothyroidism.   PLAN:  1. Schedule a cardiopulmonary function test to further elucidate the      etiology of his significant dyspnea.  2. In light of recent studies, including the catheterization revealing      normal left ventricular function, and with no evidence of      congestive heart failure, we will discontinue Lasix.  However, the      patient will remain on Imdur and Coreg which were added by Dr.      Andee Lineman.  3. Return clinic visit in the next several weeks for review of his CPX      test.  At that time, we may consider further evaluation with formal      pulmonary function tests, followed by evaluation by pulmonology.      Gene Serpe, PA-C  Electronically Signed      Learta Codding, MD,FACC  Electronically Signed   GS/MedQ  DD: 10/30/2006  DT: 10/30/2006  Job #: [id number]   cc:   Selinda Flavin

## 2011-04-07 NOTE — Assessment & Plan Note (Signed)
[facility]                            [facility]   Willie Williamson, Willie Williamson                    MRN:          [id number]  DATE:10/03/2006                            DOB:          11-18-1931    REFERRING PHYSICIAN:  Marcelino Duster   HISTORY OF PRESENT ILLNESS:  The patient is a 77 year old male with a  history of significant dyspnea.  The patient was seen in our office on  July 13, 2006.  It is noted by chest x-ray that the patient had emphysema.  However, EKG also demonstrated Q waves consistent with an old inferior wall  myocardial infarction.  The patient had multiple cardiac risk factors and  was referred for a Cardiolite stress study to rule out ischemia.  The  ejection fraction was 64%, ventricular volumes were normal, but the  perfusion data showed a large inferior and infra-septal defect descending  from the base of the apex, consistent with a prior myocardial infarction or  scar.  The patient, then, was notified about these results and the  recommendation for cardiac catheterization was given.  However, at that time  the patient wanted to continue medical treatment.  In the interim he has  continued to be rather short of breath on minimal exertion.  Denies,  however, any substernal chest pain.  He now presents for followup and is  willing to consider cardiac catheterization at this point in time.  The  patient has stated he has no orthopnea, PND, palpitations, or syncope.   MEDICATIONS:  1. Lovastatin 20 mg a day.  2. Aspirin 81 mg a day.  3. Levothyroxine 75 mcg p.o. daily.  4. Isosorbide 15 mg p.o. daily.   PHYSICAL EXAMINATION:  VITAL SIGNS:  Blood pressure 128/78, heart rate 84  beats per minute, weight is 158 pounds.  NECK:  Normal carotid upstroke.  No carotid bruits.  LUNGS:  Clear breath sounds bilaterally.  HEART:  Regular rate and rhythm.  Normal S1, S2.  ABDOMEN:  Soft.  EXTREMITIES:  No cyanosis,  clubbing, or edema.  NEURO:  The patient is alert and oriented, and grossly nonfocal.  Of note is that an exam of the groin was performed, as the patient mentioned  he had significant intertrigo.  He has been placed on antifungal oral  therapy and nystatin powder.  The patient, indeed, has still intertrigo  between the scrotum and the groin on both sides.   PROBLEM LIST:  1. Dyspnea.      a.     Underlying coronary artery disease with large inferior infra-       septal scar by Cardiolite study, but with normal ejection fraction.      b.     Emphysema by chest x-ray.      c.     No chest pain.  2. Previous history of tobacco use.  3. History of hypothyroidism.  4. Dyslipidemia.  5. Intertriginous dermatophytosis.   PLAN:  1. The patient was placed on carvedilol today at low dose 3.125 mg p.o.      b.i.d. due to his underlying  coronary artery disease as diagnosed by      Cardiolite study.  The patient also has symptoms that could be      consistent with heart failure and I have given him a prescription of      Lasix 10 mg a day.  Imdur has been further increased to 30 mg p.o.      daily.  2. The patient has agreed to now proceed with cardiac catheterization.  He      probably should have a left and right heart catheterization done.  We      will hold off on the catheterization until his intertrigo has been      cleared.  Given the patient additional prescription of nystatin powder.  3. JV catheterization can be done next week and the patient wants this      postponed until after thanksgiving.  4. I suspect the patient will have significant coronary artery disease,      but there might be, in addition, a component of emphysema contributing      to his dyspnea.  This will need to be taken into account if we proceed      with percutaneous cardiac intervention.     Learta Codding, MD,FACC  Electronically Signed    GED/MedQ  DD: 10/03/2006  DT: 10/03/2006  Job #: [id number]   cc:    Marcelino Duster

## 2011-04-07 NOTE — Assessment & Plan Note (Signed)
[facility]                            [facility]   Willie Williamson, Willie Williamson                    MRN:          [id number]  DATE:10/09/2006                            DOB:          03/05/1931    HISTORY OF PRESENT ILLNESS:  The patient is a 77 year old male with a  history of dyspnea. The patient has been diagnosed with coronary artery  disease by Cardiolite study. Please see my dictated note from October 03, 2006 regarding details. The patient was started on medical therapy with  nitrates, beta blockers and low-dose Lasix. In the interim, he has improved  significantly. The plan was actually based on his Cardiolite to proceed to  catheterization, but the patient had put this on hold the last that he was  seen in the office. He had significant intertrigo around the scrotum, and we  felt that catheterization should be delayed until this infection was healed.  I actually gave the patient a prescription for Nystatin powder. This has  worked quite well, and his intertrigo is near resolved. Interestingly, the  patient stated that since he was started on medical therapy symptomatology  has improved rather significantly. He is significantly less short of breath,  and exercise tolerance has improved. He denies any chest pain.   The patient has now been set up for a JV catheterization to evaluate his  underlying coronary artery disease. I will also up titrate his medication  and increase Coreg to 6.25 b.i.d. and Lasix 20 mg a day. Of note is that the  patient does have significant emphysema by chest x-ray, and this may well be  a large contributor of his dyspnea. We will evaluate him with left and right  heart catheterization and then based on this give final recommendations.   Please see my dictated note regarding the patient's H&P on October 03, 2006.     Learta Codding, MD,FACC  Electronically Signed    GED/MedQ  DD: 10/09/2006  DT:  10/09/2006  Job #: [id number]   cc:   Orvan Falconer, M.D.

## 2011-04-07 NOTE — Assessment & Plan Note (Signed)
[place] [facility]                             PULMONARY OFFICE NOTE   BADEN, BETSCH                       MRN:          [id number]  DATE:01/22/2007                            DOB:          May 11, 1931    CHIEF COMPLAINT:  Evaluate COPD.   HISTORY OF PRESENT ILLNESS:  This is a 77 year old white male with  history of chronic dyspnea for two years, getting worse since June of  2007. Got somewhat worse over this time. Noting increased anxiety,  having a difficult time walking. Had a cardiac catheterization December  of 2007, showing non-obstructive coronary artery disease, but not enough  to explain the dyspnea. He is referred here now for further evaluation.  He smokes a pack and a half a day for 44 years and quit smoking in 1998.  He has an albuterol MDI that he is using this one puff six times daily  with some improvement. He is coughing minimal mucus. It is pale white.  He has wheezing the last four weeks. Denies any heartburn, irregular  heartbeats, indigestion or loss of appetite or other symptom complexes  relative to the GI system. No history of headaches, nasal congestion,  sneezing, itching, earaches.   PAST MEDICAL HISTORY:   MEDICAL HISTORY:  1. Hypercholesterolemia.  2. No history of heart disease, diabetes. No history of cancer,      headaches, sleep apnea or disorders of the central nervous system.  3. He had sinus surgery in 1998.  4. Heart catheterization in December 2007, as noted above.   MEDICATION ALLERGIES:  PENICILLIN.   CURRENT MEDICATIONS:  1. Isosorbide 30 mg daily.  2. Levothyroxine 75 mg daily.  3. Coreg 6.25 mg b.i.d.  4. Lovastatin 20 mg daily.  5. Aspirin 81 mg daily.  6. Albuterol p.r.n.   SOCIAL HISTORY:  Retired Higher education careers adviser. Lives at home with his wife.   FAMILY HISTORY:  Emphysema in mother. Heart disease in mother. Cancer in  mother.   PHYSICAL EXAMINATION:  This is a thin, elderly male in no  distress.  Temperature is 97.8, blood pressure is 140/84, pulse 79, saturation is  94% on room air.  CHEST: Showed diminished breath sound with prolonged expiratory phase.  No wheeze or rhonchi were noted.  CARDIAC: Showed a regular rate and rhythm without S3. Normal S1, S2.  ABDOMEN: Soft, nontender.  EXTREMITIES: Showed no edema or clubbing or venous disease.  SKIN: Was clear.  NEUROLOGIC: Was intact.  HEENT: Showed no jugular venous distention. No lymphadenopathy.  Oropharynx was clear.  NECK: Supple.   LABORATORY DATA:  Chest x-ray was obtained and reviewed and showed  emphysematous change. Airway hyperinflation was noted. Cardiopulmonary  stress test had already been done on this patient and showed ventilatory  limitations and pulmonary function showing a severely reduced FEV1 of  0.92, which is 33% predicted, FEC of 2.25, which is 69% predicted.   IMPRESSION:  Is that of chronic obstructive lung disease with severe  obstructive airflow function.   PLAN:  For this patient is to pulse prednisone at 40 mg a day  with a  slow taper to begin Advair 500/50 one spray b.i.d., utilize albuterol as  needed, obtain full set of pulmonary function studies once the results  of the above are available. Further recommendations will follow. The  patient is to return in six weeks.     Charlcie Cradle Delford Field, MD, [facility]  Electronically Signed    PEW/MedQ  DD: 01/23/2007  DT: 01/23/2007  Job #: [id number]   cc:   Learta Codding, MD,FACC

## 2011-04-07 NOTE — Cardiovascular Report (Signed)
NAMEABDUL, [name]                ACCOUNT NO.:  [id number]   MEDICAL RECORD NO.:  [id number]          PATIENT TYPE:  OIB   LOCATION:  1962                         FACILITY:  [facility]   PHYSICIAN:  Rollene Rotunda, MD, FACCDATE OF BIRTH:  1931/04/18   DATE OF PROCEDURE:  DATE OF DISCHARGE:                            CARDIAC CATHETERIZATION   PROCEDURE:  Left heart catheterization/coronary arteriography.   INDICATIONS:  The patient with chest pain and a Cardiolite suggesting  previous inferior infarct.   PROCEDURE NOTE:  Left heart catheterization was performed via the right  femoral artery. The artery was cannulated using the anterior wall  puncture.  A #4-French arterial sheath was inserted via the modified  Seldinger technique.  Preformed Judkins and a pigtail catheter were  utilized.  The patient tolerated seizure well and left the lab in stable  condition.   HEMODYNAMIC RESULTS:  LV 134/18, AO 128/92.  Coronaries, the left main  had distal 25% stenosis.  The LAD had proximal 25% stenosis followed by  a mid 25% stenosis.  The diagonal had mid luminal irregularities.  The  circumflex and the AV groove had an ostial of 25% stenosis.  There was  mid long 25% stenosis.  The distal AV groove was small with diffuse  luminal irregularities.  There was a mid obtuse marginal which was large  with a long proximal 25% stenosis.  There was a mid 30% stenosis. The  right coronary artery is a large dominant vessel.  There is proximal 60%  stenosis.  There were diffuse 25% lesions.  There was an acute marginal  with luminal irregularities.  The PDA was moderate size and normal.  The  posterolateral was moderate with luminal irregularities.   LEFT VENTRICULOGRAM:  The left ventriculogram was obtained in the RAO  projection.  The EF of 65% with normal wall motion.   CONCLUSION:  Moderate coronary plaque nonobstructive.  The vessels were  moderately diffusely calcified.  However, there is no  high-grade right  coronary lesion corresponding to the Cardiolite results.  The patient  has no wall motion abnormality.  He has an overall preserved ejection  fraction.   PLAN:  At this point no further cardiac workup is suggested.  He will  continue with primary risk reduction.      Rollene Rotunda, MD, [facility]  Electronically Signed     JH/MEDQ  D:  10/15/2006  T:  10/15/2006  Job:  [id number]   cc:   Dr. Marcelino Duster  Dr. [name]

## 2011-04-07 NOTE — Assessment & Plan Note (Signed)
[facility]                            [facility]   CEZAR, MISIASZEK                    MRN:          [id number]  DATE:07/13/2006                            DOB:          07/05/1931    REFERRING PHYSICIAN:  Marcelino Duster   REASON FOR CONSULTATION:  Evaluation of dyspnea in a patient with multiple  cardiac risk factors.   HISTORY OF PRESENT ILLNESS:  The patient is a 77 year old male with a  history of dyspnea.  The patient has been referred by Dr. Orvan Falconer for  further evaluation.  According to the patient's history, he developed,  approximately a year and a half ago, increased dyspnea on moderate exertion.  More recently, however, in the last several weeks, he has noticed on minimal  exertion he gets particularly short of breath.  He states that when he walks  on level he seems to be doing okay, but as soon as he walks a slight incline  he becomes rather short of breath.  Also, any type of labor and house chores  will cause him to have dyspnea.  He denies any substernal chest pain.  He  has also been recently diagnosed with hypothyroidism; was started on  Synthroid.  He denies any palpitations or syncope.  He has no orthopnea or  PND.  The patient used to be a heavy smoker, smoked up to a pack and a half  a day for the last 40 years; he did quit 8-1/2 years ago.  The patient  denies any exertional chest pain.  He denies any nausea, vomiting, fever,  chills, abdominal pain, dysuria or frequency, and the remainder of review of  systems is negative.   ALLERGIES:  PENICILLIN CAUSES RASH AND ITCHING.   PAST MEDICAL HISTORY:  1. History of polyps removed from the sinus.  2. History of blindness in the left eye.   SOCIAL HISTORY:  The patient is married and retired.  He has 1 child.  He  does not smoke currently.  He denies any alcohol use.  Denies drug use.   FAMILY HISTORY:  Mother died age 64.  Father died age 84 from  kidney  disease.   MEDICATIONS:  Lovastatin 20 mg p.o. daily, levothyroxine 50 mcg p.o. daily,  aspirin 81 mg a day.   REVIEW OF SYSTEMS:  As outlined above.   PHYSICAL EXAMINATION:  VITAL SIGNS:  Blood pressure 102/70, heart rate is 70  beats per minute, weight is 162 pounds.  NECK:  Exam reveals a normal carotid upstroke and no carotid bruits.  LUNGS:  Clear bilaterally, somewhat diminished.  HEART:  Regular rate and rhythm with normal S1, S2.  No murmurs, rubs, or  gallops.  ABDOMEN:  Soft, nontender.  No rebound or guarding.  Good bowel sounds.  EXTREMITY:  Exam reveals no cyanosis, clubbing, or edema.  NEURO:  The patient is alert, oriented, and grossly nonfocal.   PROBLEMS:  1. Exertional dyspnea.      a.     Rule out underlying lung disease (chest x-ray shows emphysema).      b.  Rule out ischemic heart disease.  2. Previous history of tobacco use.  3. Recently diagnosed with hypothyroidism.  4. Dyslipidemia.   PLAN:  1. The patient was given a prescription for Imdur 15 mg a day.  He will      continue to take aspirin.  I am suspicious that he might have      underlying ischemic heart disease.  2. The patient has been referred for adenosine Cardiolite study with low-      level exercise, which will be done next week.  If the study is      abnormal, he will need a cardiac catheterization.  3. A 6-minute walk with oximetry has also been ordered to make sure the      patient has no exertional hypoxemia.  4. The patient will follow up in our office in the next couple of weeks.                                   Learta Codding, MD, [facility]   GED/MedQ  DD:  07/13/2006  DT:  07/14/2006  Job #:  [id number]   cc:   Marcelino Duster

## 2011-04-07 NOTE — Assessment & Plan Note (Signed)
[facility]                             PULMONARY OFFICE NOTE   Willie Williamson, Willie Williamson                         MRN:          [id number]  DATE:03/07/2007                            DOB:          1931/04/29    Willie Williamson is a 77 year old male, history of chronic obstructive lung  disease, asthmatic bronchitis of recent flare.  He is markedly better  since we gave him pulsed prednisone and began the Advair 500/50 one  spray b.i.d.  He is now off systemic steroids.  He had a recent tumor  removed from the bladder and he is recovering from this as well.   He maintains:  1. Isosorbide 30 mg daily.  2. Levothyroxine 75mg  daily.  3. Coreg 6.25 mg b.i.d.  4. Lovastatin 20 mg bedtime.  5. Aspirin 81 mg daily.  6. Advair now at 500/50 one spray b.i.d.   EXAMINATION:  This is an elderly male in no distress.  Temperature 97.8,  blood pressure 114/70, pulse 82, saturation 95% room air.  CHEST:  Showed distant breath sounds with prolonged expiratory phase, no  wheeze or rhonchi were noted.  CARDIAC:  Showed a regular rate and rhythm without S3; normal S1, S2.  ABDOMEN:  Soft, nontender.  EXTREMITIES:  Showed no edema or clubbing.  SKIN:  Clear.   LABORATORY DATA:  Spirometry obtained today showed an FEV1 of 1.39, FVC  of 3.88, FEV1/FEC ratio 36% predicted, total lung capacity at 125%  predicted, diffusion capacity 62% predicted.   IMPRESSION:  Severe obstructive airflow disease with significant  reduction in diffusion capacity.   PLAN:  Maintain Advair 500/50 one spray b.i.d. and we will see the  patient back in return followup in 2 months.     Charlcie Cradle Delford Field, MD, [facility]  Electronically Signed    PEW/MedQ  DD: 03/07/2007  DT: 03/08/2007  Job #: [id number]   cc:   Learta Codding, MD,FACC

## 2011-04-07 NOTE — Assessment & Plan Note (Signed)
[facility]                          [facility]   YACQUB, BASTON                       MRN:          [id number]  DATE:12/04/2006                            DOB:          10-16-1931    HISTORY OF PRESENT ILLNESS:  The patient is a 77 year old male with a  history of exertional dyspnea. The patient initially received a workup  for underlying ischemic heart disease and by catheterization was found  to have moderate non-obstructive CAD. His ejection fraction was normal  at 65%. A 6-minute walk test also revealed saturation going from 94% to  91% after walking with some shortness of breath. We felt that this  patient probably has underlying lung disease and emphysema. He was  referred for CPX testing and indeed the pulmonary function test showed  an FEC of 59%, FEV1 of 33%. The cardiac parameters on CPX did not show  any cardiac limitations.   The patient states that today he is congested and he has had some  purulent secretions with increased shortness of breath.   MEDICATIONS:  1. Lovastatin 20 mg p.o. nightly.  2. Aspirin 81 mg p.o. daily.  3. Levothyroxine.  4. Isosorbide 30 daily.  5. Coreg 6.25 b.i.d.   PHYSICAL EXAMINATION:  VITAL SIGNS: Blood pressure is 150/88, heart rate  is 66 beats per minute.  NECK: Normal carotid upstroke. No carotid bruits.  LUNGS:  Clear breath sounds bilaterally.  HEART: Regular rate and rhythm. Normal S1, S2. No murmur, rubs or  gallops.  ABDOMEN: Soft.  EXTREMITIES: No cyanosis, clubbing or edema.  NEURO: The patient is alert, oriented and grossly nonfocal.   PROBLEM LIST:  1. Exertional dyspnea.      a.     Chronic obstructive pulmonary disease/emphysema       (significant, see pulmonary function tests).      b.     No limitation from underlying heart disease per CPX testing.  2. Non-obstructive coronary artery disease with false positive      adenosine Cardiolite study.  3. Normal  left ventricular function.  4. Dyslipidemia.  5. Hypothyroidism.   PLAN:  1. The patient's dyspnea appears to be largely due to his underlying      chronic obstructive pulmonary disease. I have started the patient      on Atrovent inhalers as well as doxycycline for bronchitis. I have      scheduled referral with Dr. Orson Aloe for further pulmonary      evaluation.  2. From a cardiac perspective, the patient is stable and he had no      limitations on cardiopulmonary testing as outlined above. The      patient can follow up with [place] in 1 year.     Learta Codding, MD,FACC  Electronically Signed    GED/MedQ  DD: 12/04/2006  DT: 12/04/2006  Job #: [id number]

## 2011-04-11 NOTE — Telephone Encounter (Signed)
Dr. Delford Field, ok to fill carvedilol?  Pls advise.  Thanks!

## 2011-04-12 ENCOUNTER — Other Ambulatory Visit: Payer: Self-pay | Admitting: *Deleted

## 2011-04-12 MED ORDER — CARVEDILOL 12.5 MG PO TABS: 12.5000 mg | ORAL_TABLET | Freq: Two times a day (BID) | ORAL | Status: DC

## 2011-04-18 ENCOUNTER — Other Ambulatory Visit: Payer: Self-pay | Admitting: *Deleted

## 2011-04-18 MED ORDER — AMLODIPINE BESYLATE 5 MG PO TABS: 5.0000 mg | ORAL_TABLET | Freq: Every day | ORAL | Status: DC

## 2011-05-02 ENCOUNTER — Encounter: Payer: Self-pay | Admitting: Cardiology

## 2011-05-12 ENCOUNTER — Encounter: Payer: Self-pay | Admitting: Critical Care Medicine

## 2011-05-15 ENCOUNTER — Encounter: Payer: Self-pay | Admitting: Critical Care Medicine

## 2011-05-15 ENCOUNTER — Ambulatory Visit (INDEPENDENT_AMBULATORY_CARE_PROVIDER_SITE_OTHER): Payer: Medicare Other | Admitting: Critical Care Medicine

## 2011-05-15 VITALS — BP 118/80 | HR 72 | Temp 97.9°F | Ht 68.0 in | Wt 163.8 lb

## 2011-05-15 DIAGNOSIS — J449 Chronic obstructive pulmonary disease, unspecified: Secondary | ICD-10-CM

## 2011-05-15 MED ORDER — FLUTICASONE-SALMETEROL 250-50 MCG/DOSE IN AEPB: 1.0000 | INHALATION_SPRAY | Freq: Two times a day (BID) | RESPIRATORY_TRACT | Status: DC

## 2011-05-15 MED ORDER — ALBUTEROL SULFATE HFA 108 (90 BASE) MCG/ACT IN AERS: 2.0000 | INHALATION_SPRAY | RESPIRATORY_TRACT | Status: DC | PRN

## 2011-05-15 NOTE — Patient Instructions (Signed)
No change in medications. Return in        6 months        

## 2011-05-15 NOTE — Progress Notes (Signed)
Subjective:    Patient ID: Willie Williamson, male    DOB: 05-14-1931, 77 y.o.   MRN: [id number]  HPI History of Present Illness:  This is a 77 y.o.  white male with history of chronic obstructive lung disease. He has had bladder cancer with tumors removed from the bladder. Prostatic hypertrophy surgery is also been performed. Both of these surgeries were done at [place], [place] by urology at that site.   05/15/2011 Seen by pcp few weeks ago and rx with ABx for bronchitis.  Now mucus is clear.  Now notes some dyspnea with exertion. If exerts self will get dyspneic.  No real chest pain.   Now off timolol . Pt denies any significant sore throat, nasal congestion or excess secretions, fever, chills, sweats, unintended weight loss, pleurtic or exertional chest pain, orthopnea PND, or leg swelling Pt denies any increase in rescue therapy over baseline, denies waking up needing it or having any early am or nocturnal exacerbations of coughing/wheezing/or dyspnea. Pt also denies any obvious fluctuation in symptoms with  weather or environmental change or other alleviating or aggravating factors  Past Medical History  Diagnosis Date  . Unspecified essential hypertension   . Shortness of breath   . Acute bronchitis   . Other acute sinusitis   . Malignant neoplasm of bladder, part unspecified   . Other and unspecified hyperlipidemia   . Coronary atherosclerosis of unspecified type of vessel, native or graft   . Other emphysema   . Chronic airway obstruction, not elsewhere classified   . Hypothyroidism      History reviewed. No pertinent family history.   History   Social History  . Marital Status: Married    Spouse Name: N/A    Number of Children: N/A  . Years of Education: N/A   Occupational History  . retired    Social History Main Topics  . Smoking status: Former Smoker -- 1.5 packs/day for 48 years    Types: Cigarettes    Quit date: 11/20/1996  . Smokeless tobacco: Never Used    . Alcohol Use: Not on file  . Drug Use: Not on file  . Sexually Active: Not on file   Other Topics Concern  . Not on file   Social History Narrative  . No narrative on file     Allergies  Allergen Reactions  . Penicillins     REACTION: rash     Outpatient Prescriptions Prior to Visit  Medication Sig Dispense Refill  . amLODipine (NORVASC) 5 MG tablet Take 1 tablet (5 mg total) by mouth daily.  30 tablet  6  . aspirin 81 MG tablet Take 81 mg by mouth daily.        . carvedilol (COREG) 12.5 MG tablet Take 1 tablet (12.5 mg total) by mouth 2 (two) times daily with a meal.  60 tablet  1  . levothyroxine (SYNTHROID, LEVOTHROID) 112 MCG tablet Take 112 mcg by mouth daily.        Marland Kitchen lovastatin (MEVACOR) 20 MG tablet Take 20 mg by mouth at bedtime.        Marland Kitchen scopolamine (HYOSCINE) 0.25 % ophthalmic solution Place 1 drop into both eyes as needed.       Marland Kitchen albuterol (PROVENTIL HFA) 108 (90 BASE) MCG/ACT inhaler Inhale 2 puffs into the lungs every 6 (six) hours as needed.        . Fluticasone-Salmeterol (ADVAIR DISKUS) 250-50 MCG/DOSE AEPB Inhale 1 puff into the lungs every 12 (twelve)  hours.        . timolol (BETIMOL) 0.5 % ophthalmic solution Place 1 drop into both eyes as needed.            Review of Systems ROS Constitutional:   No  weight loss, night sweats,  Fevers, chills, fatigue, lassitude. HEENT:   No headaches,  Difficulty swallowing,  Tooth/dental problems,  Sore throat,                No sneezing, itching, ear ache, nasal congestion, post nasal drip,   CV:  No chest pain,  Orthopnea, PND, swelling in lower extremities, anasarca, dizziness, palpitations  GI  No heartburn, indigestion, abdominal pain, nausea, vomiting, diarrhea, change in bowel habits, loss of appetite  Resp: Notes  shortness of breath with exertion or at rest.  No excess mucus, notes  productive cough,  No non-productive cough,  No coughing up of blood.  No change in color of mucus.  No wheezing.  No chest  wall deformity  Skin: no rash or lesions.  GU: no dysuria, change in color of urine, no urgency or frequency.  No flank pain.  MS:  No joint pain or swelling.  No decreased range of motion.  No back pain.  Psych:  No change in mood or affect. No depression or anxiety.  No memory loss.      Objective:   Physical Exam Filed Vitals:   05/15/11 0947  BP: 118/80  Pulse: 72  Temp: 97.9 F (36.6 C)  TempSrc: Oral  Height: 5\' 8"  (1.727 m)  Weight: 163 lb 12.8 oz (74.299 kg)  SpO2: 93%    Gen: Pleasant, well-nourished, in no distress,  normal affect  ENT: No lesions,  mouth clear,  oropharynx clear, no postnasal drip  Neck: No JVD, no TMG, no carotid bruits  Lungs: No use of accessory muscles, no dullness to percussion, distant BS Cardiovascular: RRR, heart sounds normal, no murmur or gallops, no peripheral edema  Abdomen: soft and NT, no HSM,  BS normal  Musculoskeletal: No deformities, no cyanosis or clubbing  Neuro: alert, non focal  Skin: Warm, no lesions or rashes      PFT Conversion 10/25/2009  FVC 3.88  FVC PREDICT 3.83  FVC  % Predicted 101  FEV1 1.39  FEV1 PREDICT 2.54  FEV % Predicted 55  FEV1/FVC 35.8  FEV1/FVC PRE 68  FeF 25-75 0.33  FeF 25-75 % Predicted 2.33  FEF % EXPEC 14  POST FVC 4.34  FVCPRDPST 3.83  POST FVC%EXP 114  POST FEV1 1.56  FEV1PRDPST 2.54  POSTFEV1%PRD 62  PSTFEV1/FVC 36  FEV1FVCPRDPS 68  PSTFEF25/75% 0.37  PSTFEF25/75P 2.33  FEF2575%EXPS 16  Residual Volume (ml) 3.31  RV % EXPECT 138  DLCO (ml/mmHg sec) 10.6  DLCO % EXPEC 62  DLCO/VA 2.08  DLCO/VA%EXP 60    Assessment & Plan:   C O P D Golds stage III Copd stable Has had two exacerbations in 2012 ,thus far.  If exacerbation rate continues will consider Daliresp Plan No change in medications. Return in          6months    Updated Medication List Outpatient Encounter Prescriptions as of 05/15/2011  Medication Sig Dispense Refill  . albuterol (PROAIR HFA) 108  (90 BASE) MCG/ACT inhaler Inhale 2 puffs into the lungs every 4 (four) hours as needed.  1 Inhaler  6  . amLODipine (NORVASC) 5 MG tablet Take 1 tablet (5 mg total) by mouth daily.  30 tablet  6  .  aspirin 81 MG tablet Take 81 mg by mouth daily.        . carvedilol (COREG) 12.5 MG tablet Take 1 tablet (12.5 mg total) by mouth 2 (two) times daily with a meal.  60 tablet  1  . Fluticasone-Salmeterol (ADVAIR DISKUS) 250-50 MCG/DOSE AEPB Inhale 1 puff into the lungs every 12 (twelve) hours.  60 each  11  . levothyroxine (SYNTHROID, LEVOTHROID) 112 MCG tablet Take 112 mcg by mouth daily.        Marland Kitchen lovastatin (MEVACOR) 20 MG tablet Take 20 mg by mouth at bedtime.        Marland Kitchen scopolamine (HYOSCINE) 0.25 % ophthalmic solution Place 1 drop into both eyes as needed.       Marland Kitchen DISCONTD: albuterol (PROVENTIL HFA) 108 (90 BASE) MCG/ACT inhaler Inhale 2 puffs into the lungs every 6 (six) hours as needed.        Marland Kitchen DISCONTD: Fluticasone-Salmeterol (ADVAIR DISKUS) 250-50 MCG/DOSE AEPB Inhale 1 puff into the lungs every 12 (twelve) hours.        Marland Kitchen DISCONTD: timolol (BETIMOL) 0.5 % ophthalmic solution Place 1 drop into both eyes as needed.

## 2011-05-15 NOTE — Progress Notes (Deleted)
  Subjective:    Patient ID: Willie Williamson, male    DOB: 02-23-1931, 77 y.o.   MRN: [id number]  HPI    Review of Systems     Objective:   Physical Exam        Assessment & Plan:

## 2011-05-15 NOTE — Assessment & Plan Note (Signed)
Golds stage III Copd stable Has had two exacerbations in 2012 ,thus far.  If exacerbation rate continues will consider Daliresp Plan No change in medications. Return in          6months

## 2011-05-19 ENCOUNTER — Ambulatory Visit (INDEPENDENT_AMBULATORY_CARE_PROVIDER_SITE_OTHER): Payer: Medicare Other | Admitting: Adult Health

## 2011-05-19 ENCOUNTER — Ambulatory Visit: Payer: Self-pay | Admitting: Cardiology

## 2011-05-19 ENCOUNTER — Encounter: Payer: Self-pay | Admitting: Adult Health

## 2011-05-19 DIAGNOSIS — I2510 Atherosclerotic heart disease of native coronary artery without angina pectoris: Secondary | ICD-10-CM

## 2011-05-19 DIAGNOSIS — J449 Chronic obstructive pulmonary disease, unspecified: Secondary | ICD-10-CM

## 2011-05-19 DIAGNOSIS — I10 Essential (primary) hypertension: Secondary | ICD-10-CM

## 2011-05-19 NOTE — Progress Notes (Signed)
HPI:  [name] is a 77 y/o male patient of Dr. Andee Lineman usually followed in [place].  He comes today for annual cardiac check-up.  He has a history of hypertension, COPD, and nonobstructive CAD.  He has chronic shortness of breath. He is followed by Dr. Delford Field for the COPD, and by Dr. Leandrew Koyanagi, primary for cholesterol levels and other labs.  He is doing well with no cardiac complaints. He is having more trouble breathing as the outside temperature today is >100 degrees. He has been advised to stay indoors as much as possible when at home, by his pulmonologist.  He has had no hospitalizations, surgeries, this past year.  No new diagnosis or allergies.  Allergies  Allergen Reactions  . Penicillins     REACTION: rash    Current Outpatient Prescriptions  Medication Sig Dispense Refill  . albuterol (PROAIR HFA) 108 (90 BASE) MCG/ACT inhaler Inhale 2 puffs into the lungs every 4 (four) hours as needed.  1 Inhaler  6  . amLODipine (NORVASC) 5 MG tablet Take 1 tablet (5 mg total) by mouth daily.  30 tablet  6  . aspirin 81 MG tablet Take 81 mg by mouth daily.        . carvedilol (COREG) 6.25 MG tablet Take 6.25 mg by mouth 2 (two) times daily with a meal.        . Fluticasone-Salmeterol (ADVAIR DISKUS) 250-50 MCG/DOSE AEPB Inhale 1 puff into the lungs every 12 (twelve) hours.  60 each  11  . levothyroxine (SYNTHROID, LEVOTHROID) 112 MCG tablet Take 112 mcg by mouth daily.        Marland Kitchen lovastatin (MEVACOR) 20 MG tablet Take 20 mg by mouth at bedtime.        Marland Kitchen scopolamine (HYOSCINE) 0.25 % ophthalmic solution Place 1 drop into both eyes as needed.       Marland Kitchen DISCONTD: carvedilol (COREG) 12.5 MG tablet Take 1 tablet (12.5 mg total) by mouth 2 (two) times daily with a meal.  60 tablet  1    Past Medical History  Diagnosis Date  . Unspecified essential hypertension   . Shortness of breath   . Acute bronchitis   . Other acute sinusitis   . Malignant neoplasm of bladder, part unspecified   . Other and unspecified  hyperlipidemia   . Coronary atherosclerosis of unspecified type of vessel, native or graft   . Other emphysema   . Chronic airway obstruction, not elsewhere classified   . Hypothyroidism     Past Surgical History  Procedure Date  . Turp vaporization   . Bladder surgery     VOZ:DGUYQI of systems complete and found to be negative unless listed above PHYSICAL EXAM BP 140/81  Pulse 75  Ht 5\' 8"  (1.727 m)  Wt 161 lb (73.029 kg)  BMI 24.48 kg/m2  SpO2 94% General: Well developed, well nourished, in no acute distress Head: Eyes PERRLA, No xanthomas.   Normal cephalic and atramatic  Lungs: Clear bilaterally to auscultation and percussion. Heart: HRRR S1 S2,  Pulses are 2+ & equal.            No carotid bruit. No JVD.  No abdominal bruits. No femoral bruits. Abdomen: Bowel sounds are positive, abdomen soft and non-tender without masses or                  Hernia's noted. Msk:  Back normal, normal gait. Normal strength and tone for age. Extremities: No clubbing, cyanosis or edema.  DP +1  Neuro: Alert and oriented X 3. Psych:  Good affect, responds appropriately   ASSESSMENT AND PLAN

## 2011-05-19 NOTE — Patient Instructions (Signed)
Your physician recommends that you schedule a follow-up appointment in:1 year with Dr. Andee Lineman

## 2011-05-19 NOTE — Assessment & Plan Note (Signed)
He is without cardiac complaint. He is keeping his wt down. CVRF are being managed by PCP.  I will make no changes in his medication regimen.  He will follow-up with Dr. Andee Lineman in one year.

## 2011-05-19 NOTE — Assessment & Plan Note (Signed)
Moderately controlled at present.  Will continue to follow.  No medication changes at this time.

## 2011-05-19 NOTE — Assessment & Plan Note (Signed)
Breathing status is a little more difficult at this time because of severe heat outside. He is trying to stay indoors at much as possible.  He is to continue follow-ups with Dr. Delford Field, pulmonologist.

## 2011-07-03 ENCOUNTER — Encounter: Payer: Self-pay | Admitting: Pulmonary Disease

## 2011-07-03 ENCOUNTER — Telehealth: Payer: Self-pay | Admitting: Critical Care Medicine

## 2011-07-03 ENCOUNTER — Ambulatory Visit (INDEPENDENT_AMBULATORY_CARE_PROVIDER_SITE_OTHER): Payer: Medicare Other | Admitting: Pulmonary Disease

## 2011-07-03 DIAGNOSIS — J449 Chronic obstructive pulmonary disease, unspecified: Secondary | ICD-10-CM

## 2011-07-03 DIAGNOSIS — J441 Chronic obstructive pulmonary disease with (acute) exacerbation: Secondary | ICD-10-CM

## 2011-07-03 MED ORDER — PREDNISONE 20 MG PO TABS: ORAL_TABLET | ORAL | Status: DC

## 2011-07-03 NOTE — Assessment & Plan Note (Signed)
The patient is having worsening dyspnea and exertion over the last 2 weeks it is most consistent with a COPD exacerbation.  He is been treated with a Z-Pak, with no improvement in his symptoms.  Currently he is not bringing up any significant mucus and it is white in color, and therefore would like to avoid further antibiotics at this time.  We'll treat him with a course of prednisone, and he is to follow up with Dr. Delford Field to discuss plans to try and decrease his frequency of exacerbations.

## 2011-07-03 NOTE — Telephone Encounter (Signed)
Pt c/o increased sob and cough for several days. PCP did treat with an abx but the pt says he is still having trouble and would like to be seen. PW is out of the office this week but pt is willing to see KC this afternoon at 2 pm.

## 2011-07-03 NOTE — Patient Instructions (Signed)
Will treat with a course of prednisone to help your breathing Stay on mucinex.  Call if you begin to produced discolored mucus followup with Dr. Delford Field in 2 weeks to discuss plans to decrease your recurrent flareups

## 2011-07-03 NOTE — Progress Notes (Signed)
  Subjective:    Patient ID: Willie Williamson, male    DOB: Feb 23, 1931, 77 y.o.   MRN: [id number]  HPI The patient comes in today for an acute sick visit.  He is usually followed by Dr. Delford Field for significant COPD.  He gives a two-week history of worsening dyspnea on exertion, and was treated with a Z-Pak by his regular physician.  He was also asked to take Mucinex and Clarinex.  Despite this he continues to have significant dyspnea and exertion above his usual baseline, but really has very little cough, mucus, and no purulence.  He does note some chest congestion/tightness.  He denies any chest pain or lower extremity edema.   Review of Systems  Constitutional: Negative for fever and unexpected weight change.  HENT: Negative for ear pain, nosebleeds, congestion, sore throat, rhinorrhea, sneezing, trouble swallowing, dental problem, postnasal drip and sinus pressure.   Eyes: Negative for redness and itching.  Respiratory: Positive for cough, shortness of breath and wheezing. Negative for chest tightness.   Cardiovascular: Negative for palpitations and leg swelling.  Gastrointestinal: Negative for nausea and vomiting.  Genitourinary: Negative for dysuria.  Musculoskeletal: Negative for joint swelling.  Skin: Negative for rash.  Neurological: Negative for headaches.  Hematological: Bruises/bleeds easily.  Psychiatric/Behavioral: Negative for dysphoric mood. The patient is not nervous/anxious.        Objective:   Physical Exam Well-developed male in no acute distress Nose without obvious purulence or discharge Chest with very diminished breath sounds throughout, but no active wheezing Heart exam with distant heart sounds, but sounds regular. Lower extremities without edema, no cyanosis noted Alert and oriented, moves all 4 extremities.       Assessment & Plan:

## 2011-07-18 ENCOUNTER — Ambulatory Visit: Payer: Medicare Other | Admitting: Pulmonary Disease

## 2011-07-18 ENCOUNTER — Ambulatory Visit: Payer: Medicare Other | Admitting: Critical Care Medicine

## 2011-08-14 ENCOUNTER — Other Ambulatory Visit: Payer: Self-pay | Admitting: *Deleted

## 2011-08-14 MED ORDER — CARVEDILOL 6.25 MG PO TABS: 6.2500 mg | ORAL_TABLET | Freq: Two times a day (BID) | ORAL | Status: DC

## 2011-08-21 ENCOUNTER — Ambulatory Visit (INDEPENDENT_AMBULATORY_CARE_PROVIDER_SITE_OTHER): Payer: Medicare Other | Admitting: Adult Health

## 2011-08-21 ENCOUNTER — Encounter: Payer: Self-pay | Admitting: Adult Health

## 2011-08-21 ENCOUNTER — Ambulatory Visit (INDEPENDENT_AMBULATORY_CARE_PROVIDER_SITE_OTHER)
Admission: RE | Admit: 2011-08-21 | Discharge: 2011-08-21 | Disposition: A | Discharge status: Home / Self Care | Payer: Medicare Other | Source: Ambulatory Visit | Attending: Adult Health | Admitting: Adult Health

## 2011-08-21 VITALS — BP 118/72 | HR 60 | Temp 97.0°F | Ht 67.0 in | Wt 162.2 lb

## 2011-08-21 DIAGNOSIS — J449 Chronic obstructive pulmonary disease, unspecified: Secondary | ICD-10-CM

## 2011-08-21 DIAGNOSIS — J441 Chronic obstructive pulmonary disease with (acute) exacerbation: Secondary | ICD-10-CM

## 2011-08-21 MED ORDER — DOXYCYCLINE HYCLATE 100 MG PO TABS: 100.0000 mg | ORAL_TABLET | Freq: Two times a day (BID) | ORAL | Status: AC

## 2011-08-21 MED ORDER — PREDNISONE 10 MG PO TABS: ORAL_TABLET | ORAL | Status: AC

## 2011-08-21 MED ORDER — ROFLUMILAST 500 MCG PO TABS: 500.0000 ug | ORAL_TABLET | Freq: Every day | ORAL | Status: DC

## 2011-08-21 NOTE — Progress Notes (Signed)
Subjective:    Patient ID: Willie Williamson, male    DOB: 1931-09-25, 77 y.o.   MRN: [id number]  HPI History of Present Illness:  This is a 77 y.o.  white male with history of chronic obstructive lung disease. He has had bladder cancer with tumors removed from the bladder. Prostatic hypertrophy surgery is also been performed. Both of these surgeries were done at [place], [place] by urology at that site.   05/15/11  Seen by pcp few weeks ago and rx with ABx for bronchitis.  Now mucus is clear.  Now notes some dyspnea with exertion. If exerts self will get dyspneic.  No real chest pain.   Now off timolol . >>cont same rx   08/21/2011 Acute OV  Pt complains of  increased SOB with all exertion, wheezing, chest congestion, productive cough with white mucus. States is the 4th time since April with these symptoms. Feels as soon as he gets better cough and increased dyspnea start all over. Was seen 6 weeks ago for COPD flare , tx with steroid taper. He does tell me his grandson age 29 has moved in with him around April this year . Grandson recently has URI symptoms. No fever, hemoptysis or weight loss. OTC not helping.   Past Medical History  Diagnosis Date  . Unspecified essential hypertension   . Shortness of breath   . Acute bronchitis   . Other acute sinusitis   . Malignant neoplasm of bladder, part unspecified   . Other and unspecified hyperlipidemia   . Coronary atherosclerosis of unspecified type of vessel, native or graft   . Other emphysema   . Chronic airway obstruction, not elsewhere classified   . Hypothyroidism      No family history on file.   History   Social History  . Marital Status: Married    Spouse Name: N/A    Number of Children: N/A  . Years of Education: N/A   Occupational History  . retired    Social History Main Topics  . Smoking status: Former Smoker -- 1.5 packs/day for 48 years    Types: Cigarettes    Quit date: 11/20/1996  . Smokeless tobacco: Never  Used  . Alcohol Use: Not on file  . Drug Use: Not on file  . Sexually Active: Not on file   Other Topics Concern  . Not on file   Social History Narrative  . No narrative on file     Allergies  Allergen Reactions  . Penicillins     REACTION: rash     Outpatient Prescriptions Prior to Visit  Medication Sig Dispense Refill  . albuterol (PROAIR HFA) 108 (90 BASE) MCG/ACT inhaler Inhale 2 puffs into the lungs every 4 (four) hours as needed.  1 Inhaler  6  . amLODipine (NORVASC) 5 MG tablet Take 1 tablet (5 mg total) by mouth daily.  30 tablet  6  . aspirin 81 MG tablet Take 81 mg by mouth daily.        . carvedilol (COREG) 6.25 MG tablet Take 1 tablet (6.25 mg total) by mouth 2 (two) times daily with a meal.  30 tablet  6  . Fluticasone-Salmeterol (ADVAIR DISKUS) 250-50 MCG/DOSE AEPB Inhale 1 puff into the lungs every 12 (twelve) hours.  60 each  11  . GuaiFENesin (MUCINEX PO) Take 400 mg by mouth 3 (three) times daily as needed.       Marland Kitchen levothyroxine (SYNTHROID, LEVOTHROID) 112 MCG tablet Take 112 mcg by  mouth daily.        Marland Kitchen lovastatin (MEVACOR) 20 MG tablet Take 20 mg by mouth at bedtime.        Marland Kitchen scopolamine (HYOSCINE) 0.25 % ophthalmic solution Place 1 drop into both eyes as needed.       . predniSONE (DELTASONE) 20 MG tablet Take 2 tabs daily x 3 days, then 1 1/2 tabs daily x 3 days, then 1 tab daily x 3 days, then 1/2 tab daily x 3 days, then stop.  15 tablet  0       Review of Systemsf ROS Constitutional:   No  weight loss, night sweats,  Fevers, chills, fatigue, lassitude. HEENT:   No headaches,  Difficulty swallowing,  Tooth/dental problems,  Sore throat,                No sneezing, itching, ear ache, nasal congestion, post nasal drip,   CV:  No chest pain,  Orthopnea, PND, swelling in lower extremities, anasarca, dizziness, palpitations  GI  No heartburn, indigestion, abdominal pain, nausea, vomiting, diarrhea, change in bowel habits, loss of appetite  Resp:  No  coughing up of blood.    No wheezing.  No chest wall deformity  Skin: no rash or lesions.  GU: no dysuria, change in color of urine, no urgency or frequency.  No flank pain.  MS:  No joint pain or swelling.  No decreased range of motion.  No back pain.  Psych:  No change in mood or affect. No depression or anxiety.  No memory loss.      Objective:   Physical Exam Filed Vitals:   08/21/11 1006  BP: 118/72  Pulse: 60  Temp: 97 F (36.1 C)  TempSrc: Oral  Height: 5\' 7"  (1.702 m)  Weight: 162 lb 3.2 oz (73.573 kg)  SpO2: 95%    Gen: Pleasant, well-nourished, in no distress, edlerly  ENT: No lesions,  mouth clear,  oropharynx clear, no postnasal drip  Neck: No JVD, no TMG, no carotid bruits  Lungs: No use of accessory muscles, no dullness to percussion, distant BS, coarse BS   Cardiovascular: RRR, heart sounds normal, no murmur or gallops, no peripheral edema  Abdomen: soft and NT, no HSM,  BS normal  Musculoskeletal: No deformities, no cyanosis or clubbing  Neuro: alert, non focal  Skin: Warm, no lesions or rashes      PFT Conversion 10/25/2009  FVC 3.88  FVC PREDICT 3.83  FVC  % Predicted 101  FEV1 1.39  FEV1 PREDICT 2.54  FEV % Predicted 55  FEV1/FVC 35.8  FEV1/FVC PRE 68  FeF 25-75 0.33  FeF 25-75 % Predicted 2.33  FEF % EXPEC 14  POST FVC 4.34  FVCPRDPST 3.83  POST FVC%EXP 114  POST FEV1 1.56  FEV1PRDPST 2.54  POSTFEV1%PRD 62  PSTFEV1/FVC 36  FEV1FVCPRDPS 68  PSTFEF25/75% 0.37  PSTFEF25/75P 2.33  FEF2575%EXPS 16  Residual Volume (ml) 3.31  RV % EXPECT 138  DLCO (ml/mmHg sec) 10.6  DLCO % EXPEC 62  DLCO/VA 2.08  DLCO/VA%EXP 60    Assessment & Plan:    COPD exacerbation Recurrent exacerbation   Plan:  Doxycycline 100mg  Twice daily  For 7 days  Mucinex DM Twice daily  As needed  Cough/congesiton  Prednisone taper over next week.  I will call with xray results.  Begin Daliresp 1 po daily  follow up Dr. Delford Field  In 4 weeks and  As needed   Please contact office for sooner follow up  if symptoms do not improve or worsen or seek emergency care          Updated Medication List Outpatient Encounter Prescriptions as of 08/21/2011  Medication Sig Dispense Refill  . albuterol (PROAIR HFA) 108 (90 BASE) MCG/ACT inhaler Inhale 2 puffs into the lungs every 4 (four) hours as needed.  1 Inhaler  6  . amLODipine (NORVASC) 5 MG tablet Take 1 tablet (5 mg total) by mouth daily.  30 tablet  6  . aspirin 81 MG tablet Take 81 mg by mouth daily.        . carvedilol (COREG) 6.25 MG tablet Take 1 tablet (6.25 mg total) by mouth 2 (two) times daily with a meal.  30 tablet  6  . Fluticasone-Salmeterol (ADVAIR DISKUS) 250-50 MCG/DOSE AEPB Inhale 1 puff into the lungs every 12 (twelve) hours.  60 each  11  . GuaiFENesin (MUCINEX PO) Take 400 mg by mouth 3 (three) times daily as needed.       Marland Kitchen levothyroxine (SYNTHROID, LEVOTHROID) 112 MCG tablet Take 112 mcg by mouth daily.        Marland Kitchen lovastatin (MEVACOR) 20 MG tablet Take 20 mg by mouth at bedtime.        Marland Kitchen scopolamine (HYOSCINE) 0.25 % ophthalmic solution Place 1 drop into both eyes as needed.       . predniSONE (DELTASONE) 20 MG tablet Take 2 tabs daily x 3 days, then 1 1/2 tabs daily x 3 days, then 1 tab daily x 3 days, then 1/2 tab daily x 3 days, then stop.  15 tablet  0

## 2011-08-21 NOTE — Assessment & Plan Note (Addendum)
Recurrent exacerbation  CXR pending   Plan:  Doxycycline 100mg  Twice daily  For 7 days  Mucinex DM Twice daily  As needed  Cough/congesiton  Prednisone taper over next week.  I will call with xray results.  Begin Daliresp 1 po daily  follow up Dr. Delford Field  In 4 weeks and As needed   Please contact office for sooner follow up if symptoms do not improve or worsen or seek emergency care

## 2011-08-21 NOTE — Patient Instructions (Addendum)
Doxycycline 100mg  Twice daily  For 7 days  Mucinex DM Twice daily  As needed  Cough/congesiton  Prednisone taper over next week.  I will call with xray results.  Begin Daliresp 1 po daily  follow up Dr. Delford Field  In 4 weeks and As needed   Please contact office for sooner follow up if symptoms do not improve or worsen or seek emergency care

## 2011-09-15 ENCOUNTER — Telehealth: Payer: Self-pay | Admitting: Critical Care Medicine

## 2011-09-15 MED ORDER — PREDNISONE 10 MG PO TABS: ORAL_TABLET | ORAL | Status: DC

## 2011-09-15 NOTE — Telephone Encounter (Signed)
Prednisone taper over next week  Prednisone 10mg  #20, 4 tabs for 2 days, then 3 tabs for 2 days, 2 tabs for 2 days, then 1 tab for 2 days, then stop  No refills  He needs to make sure he has an ov set up and follows up he has had 3 flares in last 3 months.  Daliresp was started at last ov  Please contact office for sooner follow up if symptoms do not improve or worsen or seek emergency care

## 2011-09-15 NOTE — Telephone Encounter (Signed)
Pt c/o increased sob, chest tightness and congestion x 3 days. Cough is sometimes productive. Mucus is mostly clear. He denies any wheezing. Pt states he did improve after taking abx and Pred taper given at last OV on 10/1 w/ TP. Pt says he cannot come in for an appt because he is taking care of his son who was in a motorcycle accident and has no one else that can sit with him. Dr. Delford Field is out of the office but will forward this msg to TP since she saw the pt last. Pls advise. Allergies  Allergen Reactions  . Penicillins     REACTION: rash

## 2011-09-15 NOTE — Telephone Encounter (Signed)
Pt is aware of directions for Prednisone taper. Pt is already sch for f/u with PW on 09/22/11 and told to make sure he keeps that appt. He is to call if his sxs do not improve or seek emergency help if needed. Pt verbalized understanding.

## 2011-09-22 ENCOUNTER — Encounter: Payer: Self-pay | Admitting: Critical Care Medicine

## 2011-09-22 ENCOUNTER — Ambulatory Visit (INDEPENDENT_AMBULATORY_CARE_PROVIDER_SITE_OTHER): Payer: Medicare Other | Admitting: Critical Care Medicine

## 2011-09-22 VITALS — BP 130/72 | HR 87 | Temp 98.0°F | Ht 67.0 in | Wt 155.4 lb

## 2011-09-22 DIAGNOSIS — J449 Chronic obstructive pulmonary disease, unspecified: Secondary | ICD-10-CM

## 2011-09-22 MED ORDER — FLUTICASONE-SALMETEROL 500-50 MCG/DOSE IN AEPB: 1.0000 | INHALATION_SPRAY | Freq: Two times a day (BID) | RESPIRATORY_TRACT | Status: DC

## 2011-09-22 MED ORDER — PREDNISONE 10 MG PO TABS: ORAL_TABLET | ORAL | Status: DC

## 2011-09-22 NOTE — Progress Notes (Signed)
Subjective:    Patient ID: Willie Williamson, male    DOB: 1931-06-17, 77 y.o.   MRN: [id number]  HPI This is a 77 y.o.  white male with history of chronic obstructive lung disease. He has had bladder cancer with tumors removed from the bladder. Prostatic hypertrophy surgery is also been performed. Both of these surgeries were done at [place], [place] by urology at that site.   05/15/11  Seen by pcp few weeks ago and rx with ABx for bronchitis.  Now mucus is clear.  Now notes some dyspnea with exertion. If exerts self will get dyspneic.  No real chest pain.   Now off timolol . >>cont same rx   08/21/2011 Acute OV  Pt complains of  increased SOB with all exertion, wheezing, chest congestion, productive cough with white mucus. States is the 4th time since April with these symptoms. Feels as soon as he gets better cough and increased dyspnea start all over. Was seen 6 weeks ago for COPD flare , tx with steroid taper. He does tell me his grandson age 31 has moved in with him around April this year . Grandson recently has URI symptoms. No fever, hemoptysis or weight loss. OTC not helping.   09/22/2011 At last ov with Np rx was Doxy and pred pulse Pt then got another pred cycle x 12days on 10/26 with telephone call.   Now just finished pred. Still with cough and mucus prod white to clear.  Notes dyspnea with exertion.  Last flare up not gone completely. Rx Daliresp took samples light headed and dizzy .  Eyes feel like pressure with Daliresp. No real chest pain.  No fever.  No nasal drip.  Review of Systems Constitutional:   No  weight loss, night sweats,  Fevers, chills, fatigue, lassitude. HEENT:   No headaches,  Difficulty swallowing,  Tooth/dental problems,  Sore throat,                No sneezing, itching, ear ache, nasal congestion, post nasal drip,   CV:  No chest pain,  Orthopnea, PND, swelling in lower extremities, anasarca, dizziness, palpitations  GI  No heartburn, indigestion,  abdominal pain, nausea, vomiting, diarrhea, change in bowel habits, loss of appetite  Resp: Notes  shortness of breath with exertion or at rest.  No excess mucus, notes  productive cough,  No non-productive cough,  No coughing up of blood.  No change in color of mucus.  No wheezing.  No chest wall deformity  Skin: no rash or lesions.  GU: no dysuria, change in color of urine, no urgency or frequency.  No flank pain.  MS:  No joint pain or swelling.  No decreased range of motion.  No back pain.  Psych:  No change in mood or affect. No depression or anxiety.  No memory loss.     Objective:   Physical Exam Filed Vitals:   09/22/11 1136  BP: 130/72  Pulse: 87  Temp: 98 F (36.7 C)  TempSrc: Oral  Height: 5\' 7"  (1.702 m)  Weight: 155 lb 6.4 oz (70.489 kg)  SpO2: 96%    Gen: Pleasant, well-nourished, in no distress,  normal affect  ENT: No lesions,  mouth clear,  oropharynx clear, no postnasal drip  Neck: No JVD, no TMG, no carotid bruits  Lungs: No use of accessory muscles, no dullness to percussion, scattered  rhonchi  Cardiovascular: RRR, heart sounds normal, no murmur or gallops, no peripheral edema  Abdomen: soft and  NT, no HSM,  BS normal  Musculoskeletal: No deformities, no cyanosis or clubbing  Neuro: alert, non focal  Skin: Warm, no lesions or rashes        Assessment & Plan:   C O P D Chronic obstructive lung disease with asthmatic bronchitic component and associated recurrence exacerbations Stop Daliresp due to adverse side effects Plan Resume prednisone but taper down to 10 mg a day and have Increase Advair to 500 at one puff twice daily    Updated Medication List Outpatient Encounter Prescriptions as of 09/22/2011  Medication Sig Dispense Refill  . albuterol (PROAIR HFA) 108 (90 BASE) MCG/ACT inhaler Inhale 2 puffs into the lungs every 4 (four) hours as needed.  1 Inhaler  6  . amLODipine (NORVASC) 5 MG tablet Take 1 tablet (5 mg total) by mouth  daily.  30 tablet  6  . aspirin 81 MG tablet Take 81 mg by mouth daily.        . carvedilol (COREG) 6.25 MG tablet Take 1 tablet (6.25 mg total) by mouth 2 (two) times daily with a meal.  30 tablet  6  . GuaiFENesin (MUCINEX PO) Take 400 mg by mouth 3 (three) times daily as needed.       Marland Kitchen levothyroxine (SYNTHROID, LEVOTHROID) 112 MCG tablet Take 112 mcg by mouth daily.        Marland Kitchen lovastatin (MEVACOR) 20 MG tablet Take 20 mg by mouth at bedtime.        Marland Kitchen scopolamine (HYOSCINE) 0.25 % ophthalmic solution Place 1 drop into the left eye as needed.       Marland Kitchen DISCONTD: Fluticasone-Salmeterol (ADVAIR DISKUS) 250-50 MCG/DOSE AEPB Inhale 1 puff into the lungs every 12 (twelve) hours.  60 each  11  . DISCONTD: roflumilast (DALIRESP) 500 MCG TABS tablet Take 1 tablet (500 mcg total) by mouth daily.  30 tablet  5  . Fluticasone-Salmeterol (ADVAIR DISKUS) 500-50 MCG/DOSE AEPB Inhale 1 puff into the lungs 2 (two) times daily.  60 each  6  . predniSONE (DELTASONE) 10 MG tablet Take 4 for three days 3 for three days 2 for three days then one daily  60 tablet  6  . DISCONTD: predniSONE (DELTASONE) 10 MG tablet Take 4 tab x 2 days, 3 tab x 2 days, 2 tab x 2 days, 1 tab  x 2 days then stop  20 tablet  0

## 2011-09-22 NOTE — Assessment & Plan Note (Signed)
Chronic obstructive lung disease with asthmatic bronchitic component and associated recurrence exacerbations Stop Daliresp due to adverse side effects Plan Resume prednisone but taper down to 10 mg a day and have Increase Advair to 500 at one puff twice daily

## 2011-09-22 NOTE — Patient Instructions (Signed)
Change Advair to 500 one puff twice daily Stop Daliresp Resume prednisone 10mg  Take 4 for three days 3 for three days 2 for three days then one daily Return 2 months

## 2011-11-28 ENCOUNTER — Ambulatory Visit (INDEPENDENT_AMBULATORY_CARE_PROVIDER_SITE_OTHER): Payer: Medicare Other | Admitting: Critical Care Medicine

## 2011-11-28 ENCOUNTER — Encounter: Payer: Self-pay | Admitting: Critical Care Medicine

## 2011-11-28 VITALS — HR 76 | Temp 97.5°F | Ht 67.0 in | Wt 166.4 lb

## 2011-11-28 DIAGNOSIS — J449 Chronic obstructive pulmonary disease, unspecified: Secondary | ICD-10-CM

## 2011-11-28 MED ORDER — PREDNISONE 10 MG PO TABS: ORAL_TABLET | ORAL | Status: DC

## 2011-11-28 MED ORDER — FLUTICASONE-SALMETEROL 500-50 MCG/DOSE IN AEPB: 1.0000 | INHALATION_SPRAY | Freq: Two times a day (BID) | RESPIRATORY_TRACT | Status: DC

## 2011-11-28 NOTE — Patient Instructions (Signed)
Prednisone 10mg  Take 4 for three days 3 for three days 2 for three days then one daily and stay Advair refills sent plus sample Return 3 months

## 2011-11-28 NOTE — Progress Notes (Signed)
Subjective:    Patient ID: Willie Williamson, male    DOB: [birth year], 78 y.o.   MRN: [id number]  HPI  This is a 78 y.o.  white male with history of chronic obstructive lung disease. He has had bladder cancer with tumors removed from the bladder. Prostatic hypertrophy surgery is also been performed. Both of these surgeries were done at [place], [place] by urology at that site.   05/15/11  Seen by pcp few weeks ago and rx with ABx for bronchitis.  Now mucus is clear.  Now notes some dyspnea with exertion. If exerts self will get dyspneic.  No real chest pain.   Now off timolol . >>cont same rx   08/21/2011 Acute OV  Pt complains of  increased SOB with all exertion, wheezing, chest congestion, productive cough with white mucus. States is the 4th time since April with these symptoms. Feels as soon as he gets better cough and increased dyspnea start all over. Was seen 6 weeks ago for COPD flare , tx with steroid taper. He does tell me his grandson age 35 has moved in with him around April this year . Grandson recently has URI symptoms. No fever, hemoptysis or weight loss. OTC not helping.   10/02/11 At last ov with Np rx was Doxy and pred pulse Pt then got another pred cycle x 12days on 10/26 with telephone call.   Now just finished pred. Still with cough and mucus prod white to clear.  Notes dyspnea with exertion.  Last flare up not gone completely. Rx Daliresp took samples light headed and dizzy .  Eyes feel like pressure with Daliresp. No real chest pain.  No fever.  No nasal drip.  11/30/2011 At last ov we stopped daliresp, started pred pulse then tapered to 1/d and held.  Now more congested, no mucus is coming up.  No real chest pain.  Notes mild wheeze. Notes sl more dyspnea with exertion.  Past Medical History  Diagnosis Date  . Unspecified essential hypertension   . Shortness of breath   . Acute bronchitis   . Other acute sinusitis   . Malignant neoplasm of bladder, part  unspecified   . Other and unspecified hyperlipidemia   . Coronary atherosclerosis of unspecified type of vessel, native or graft   . Other emphysema   . Chronic airway obstruction, not elsewhere classified   . Hypothyroidism      No family history on file.   History   Social History  . Marital Status: Married    Spouse Name: N/A    Number of Children: N/A  . Years of Education: N/A   Occupational History  . retired    Social History Main Topics  . Smoking status: Former Smoker -- 1.5 packs/day for 48 years    Types: Cigarettes    Quit date: 11/20/1996  . Smokeless tobacco: Never Used  . Alcohol Use: Not on file  . Drug Use: Not on file  . Sexually Active: Not on file   Other Topics Concern  . Not on file   Social History Narrative  . No narrative on file     Allergies  Allergen Reactions  . Penicillins     REACTION: rash     Outpatient Prescriptions Prior to Visit  Medication Sig Dispense Refill  . albuterol (PROAIR HFA) 108 (90 BASE) MCG/ACT inhaler Inhale 2 puffs into the lungs every 4 (four) hours as needed.  1 Inhaler  6  . amLODipine (NORVASC) 5  MG tablet Take 1 tablet (5 mg total) by mouth daily.  30 tablet  6  . aspirin 81 MG tablet Take 81 mg by mouth daily.        . carvedilol (COREG) 6.25 MG tablet Take 1 tablet (6.25 mg total) by mouth 2 (two) times daily with a meal.  30 tablet  6  . GuaiFENesin (MUCINEX PO) Take 400 mg by mouth 3 (three) times daily as needed.       Marland Kitchen levothyroxine (SYNTHROID, LEVOTHROID) 112 MCG tablet Take 112 mcg by mouth daily.        Marland Kitchen lovastatin (MEVACOR) 20 MG tablet Take 20 mg by mouth at bedtime.        Marland Kitchen scopolamine (HYOSCINE) 0.25 % ophthalmic solution Place 1 drop into the left eye as needed.       . Fluticasone-Salmeterol (ADVAIR DISKUS) 500-50 MCG/DOSE AEPB Inhale 1 puff into the lungs 2 (two) times daily.  60 each  6  . predniSONE (DELTASONE) 10 MG tablet Take 4 for three days 3 for three days 2 for three days then one  daily  60 tablet  6     Review of Systems  Constitutional:   No  weight loss, night sweats,  Fevers, chills, fatigue, lassitude. HEENT:   No headaches,  Difficulty swallowing,  Tooth/dental problems,  Sore throat,                No sneezing, itching, ear ache, nasal congestion, post nasal drip,   CV:  No chest pain,  Orthopnea, PND, swelling in lower extremities, anasarca, dizziness, palpitations  GI  No heartburn, indigestion, abdominal pain, nausea, vomiting, diarrhea, change in bowel habits, loss of appetite  Resp: Notes  shortness of breath with exertion or at rest.  No excess mucus, notes  productive cough,  No non-productive cough,  No coughing up of blood.  No change in color of mucus.  No wheezing.  No chest wall deformity  Skin: no rash or lesions.  GU: no dysuria, change in color of urine, no urgency or frequency.  No flank pain.  MS:  No joint pain or swelling.  No decreased range of motion.  No back pain.  Psych:  No change in mood or affect. No depression or anxiety.  No memory loss.     Objective:   Physical Exam  Filed Vitals:   11/28/11 1023  Pulse: 76  Temp: 97.5 F (36.4 C)  TempSrc: Oral  Height: 5\' 7"  (1.702 m)  Weight: 166 lb 6.4 oz (75.479 kg)  SpO2: 95%    Gen: Pleasant, well-nourished, in no distress,  normal affect  ENT: No lesions,  mouth clear,  oropharynx clear, no postnasal drip  Neck: No JVD, no TMG, no carotid bruits  Lungs: No use of accessory muscles, no dullness to percussion, scattered  rhonchi  Cardiovascular: RRR, heart sounds normal, no murmur or gallops, no peripheral edema  Abdomen: soft and NT, no HSM,  BS normal  Musculoskeletal: No deformities, no cyanosis or clubbing  Neuro: alert, non focal  Skin: Warm, no lesions or rashes        Assessment & Plan:   C O P D Acute tracheobronchitis with flare, failed Daliresp Plan Prednisone 10mg  Take 4 for three days 3 for three days 2 for three days then one daily and  stay Advair refills sent plus sample Return 3 months      Updated Medication List Outpatient Encounter Prescriptions as of 11/28/2011  Medication Sig  Dispense Refill  . albuterol (PROAIR HFA) 108 (90 BASE) MCG/ACT inhaler Inhale 2 puffs into the lungs every 4 (four) hours as needed.  1 Inhaler  6  . amLODipine (NORVASC) 5 MG tablet Take 1 tablet (5 mg total) by mouth daily.  30 tablet  6  . aspirin 81 MG tablet Take 81 mg by mouth daily.        . carvedilol (COREG) 6.25 MG tablet Take 1 tablet (6.25 mg total) by mouth 2 (two) times daily with a meal.  30 tablet  6  . Fluticasone-Salmeterol (ADVAIR DISKUS) 500-50 MCG/DOSE AEPB Inhale 1 puff into the lungs 2 (two) times daily.  60 each  11  . GuaiFENesin (MUCINEX PO) Take 400 mg by mouth 3 (three) times daily as needed.       Marland Kitchen levothyroxine (SYNTHROID, LEVOTHROID) 112 MCG tablet Take 112 mcg by mouth daily.        Marland Kitchen lovastatin (MEVACOR) 20 MG tablet Take 20 mg by mouth at bedtime.        . predniSONE (DELTASONE) 10 MG tablet Take 4 for three days 3 for three days 2 for three days  Then one daily and stay  60 tablet  6  . scopolamine (HYOSCINE) 0.25 % ophthalmic solution Place 1 drop into the left eye as needed.       Marland Kitchen DISCONTD: Fluticasone-Salmeterol (ADVAIR DISKUS) 500-50 MCG/DOSE AEPB Inhale 1 puff into the lungs 2 (two) times daily.  60 each  6  . DISCONTD: predniSONE (DELTASONE) 10 MG tablet Take 4 for three days 3 for three days 2 for three days then one daily  60 tablet  6  . DISCONTD: predniSONE (DELTASONE) 10 MG tablet Take 10 mg by mouth daily.

## 2011-11-30 NOTE — Assessment & Plan Note (Addendum)
Acute tracheobronchitis with flare, failed Daliresp Plan Prednisone 10mg  Take 4 for three days 3 for three days 2 for three days then one daily and stay Advair refills sent plus sample Return 3 months

## 2011-12-22 ENCOUNTER — Other Ambulatory Visit: Payer: Self-pay | Admitting: Cardiology

## 2012-03-27 ENCOUNTER — Ambulatory Visit (INDEPENDENT_AMBULATORY_CARE_PROVIDER_SITE_OTHER): Payer: Medicare Other | Admitting: Critical Care Medicine

## 2012-03-27 ENCOUNTER — Encounter: Payer: Self-pay | Admitting: Critical Care Medicine

## 2012-03-27 VITALS — BP 124/80 | HR 117 | Temp 97.6°F | Ht 67.0 in | Wt 167.0 lb

## 2012-03-27 DIAGNOSIS — J449 Chronic obstructive pulmonary disease, unspecified: Secondary | ICD-10-CM

## 2012-03-27 MED ORDER — PREDNISONE 10 MG PO TABS: ORAL_TABLET | ORAL | Status: DC

## 2012-03-27 MED ORDER — TIOTROPIUM BROMIDE MONOHYDRATE 18 MCG IN CAPS: 18.0000 ug | ORAL_CAPSULE | Freq: Every day | RESPIRATORY_TRACT | Status: DC

## 2012-03-27 MED ORDER — MOXIFLOXACIN HCL 400 MG PO TABS: 400.0000 mg | ORAL_TABLET | Freq: Every day | ORAL | Status: DC

## 2012-03-27 NOTE — Progress Notes (Signed)
Subjective:    Patient ID: Willie Williamson, male    DOB: 18-Sep-1931, 78 y.o.   MRN: [id number]  HPI  This is a 78 y.o.  white male with history of chronic obstructive lung disease. He has had bladder cancer with tumors removed from the bladder. Prostatic hypertrophy surgery is also been performed. Both of these surgeries were done at [facility], [place] by urology at that site.   05/15/11  Seen by pcp few weeks ago and rx with ABx for bronchitis.  Now mucus is clear.  Now notes some dyspnea with exertion. If exerts self will get dyspneic.  No real chest pain.   Now off timolol . >>cont same rx   08/21/2011 Acute OV  Pt complains of  increased SOB with all exertion, wheezing, chest congestion, productive cough with white mucus. States is the 4th time since April with these symptoms. Feels as soon as he gets better cough and increased dyspnea start all over. Was seen 6 weeks ago for COPD flare , tx with steroid taper. He does tell me his grandson age 47 has moved in with him around April this year . Grandson recently has URI symptoms. No fever, hemoptysis or weight loss. OTC not helping.   10/02/11 At last ov with Np rx was Doxy and pred pulse Pt then got another pred cycle x 12days on 10/26 with telephone call.   Now just finished pred. Still with cough and mucus prod white to clear.  Notes dyspnea with exertion.  Last flare up not gone completely. Rx Daliresp took samples light headed and dizzy .  Eyes feel like pressure with Daliresp. No real chest pain.  No fever.  No nasal drip.  1/8 At last ov we stopped daliresp, started pred pulse then tapered to 1/d and held.  Now more congested, no mucus is coming up.  No real chest pain.  Notes mild wheeze. Notes sl more dyspnea with exertion.  03/27/2012 At last ov we rx pred.  Pt failed Daliresp Pt had URI around first of April and still doing poorly.  PCP Rx:  spiriva and zpak.  Depomedrol. Took one box of spiriva. Now out, felt this helped.     Now mucus is 5-6x per day, tablespoon pure white thick.  No chest pain.  Pt remains dyspnea.  Also rx pred pulse.    Past Medical History  Diagnosis Date  . Unspecified essential hypertension   . Shortness of breath   . Acute bronchitis   . Other acute sinusitis   . Malignant neoplasm of bladder, part unspecified   . Other and unspecified hyperlipidemia   . Coronary atherosclerosis of unspecified type of vessel, native or graft   . Other emphysema   . Chronic airway obstruction, not elsewhere classified   . Hypothyroidism      No family history on file.   History   Social History  . Marital Status: Married    Spouse Name: N/A    Number of Children: N/A  . Years of Education: N/A   Occupational History  . retired    Social History Main Topics  . Smoking status: Former Smoker -- 1.5 packs/day for 48 years    Types: Cigarettes    Quit date: 11/20/1996  . Smokeless tobacco: Never Used  . Alcohol Use: Not on file  . Drug Use: Not on file  . Sexually Active: Not on file   Other Topics Concern  . Not on file   Social History  Narrative  . No narrative on file     Allergies  Allergen Reactions  . Penicillins     REACTION: rash     Outpatient Prescriptions Prior to Visit  Medication Sig Dispense Refill  . albuterol (PROAIR HFA) 108 (90 BASE) MCG/ACT inhaler Inhale 2 puffs into the lungs every 4 (four) hours as needed.  1 Inhaler  6  . amLODipine (NORVASC) 5 MG tablet TAKE 1 TABLET BY MOUTH EVERY DAY  30 tablet  6  . aspirin 81 MG tablet Take 81 mg by mouth daily.        . carvedilol (COREG) 6.25 MG tablet Take 1 tablet (6.25 mg total) by mouth 2 (two) times daily with a meal.  30 tablet  6  . Fluticasone-Salmeterol (ADVAIR DISKUS) 500-50 MCG/DOSE AEPB Inhale 1 puff into the lungs 2 (two) times daily.  60 each  11  . GuaiFENesin (MUCINEX PO) Take 400 mg by mouth 3 (three) times daily as needed.       Marland Kitchen levothyroxine (SYNTHROID, LEVOTHROID) 112 MCG tablet Take 112 mcg  by mouth daily.        Marland Kitchen lovastatin (MEVACOR) 20 MG tablet Take 20 mg by mouth at bedtime.        Marland Kitchen scopolamine (HYOSCINE) 0.25 % ophthalmic solution Place 1 drop into the left eye as needed.       . predniSONE (DELTASONE) 10 MG tablet Take 4 for three days 3 for three days 2 for three days  Then one daily and stay  60 tablet  6     Review of Systems  Constitutional:   No  weight loss, night sweats,  Fevers, chills, fatigue, lassitude. HEENT:   No headaches,  Difficulty swallowing,  Tooth/dental problems,  Sore throat,                No sneezing, itching, ear ache, nasal congestion, post nasal drip,   CV:  No chest pain,  Orthopnea, PND, swelling in lower extremities, anasarca, dizziness, palpitations  GI  No heartburn, indigestion, abdominal pain, nausea, vomiting, diarrhea, change in bowel habits, loss of appetite  Resp: Notes  shortness of breath with exertion or at rest.  No excess mucus, notes  productive cough,  No non-productive cough,  No coughing up of blood.  No change in color of mucus.  No wheezing.  No chest wall deformity  Skin: no rash or lesions.  GU: no dysuria, change in color of urine, no urgency or frequency.  No flank pain.  MS:  No joint pain or swelling.  No decreased range of motion.  No back pain.  Psych:  No change in mood or affect. No depression or anxiety.  No memory loss.     Objective:   Physical Exam  Filed Vitals:   03/27/12 1348  BP: 124/80  Pulse: 117  Temp: 97.6 F (36.4 C)  TempSrc: Oral  Height: 5\' 7"  (1.702 m)  Weight: 167 lb (75.751 kg)  SpO2: 95%    Gen: Pleasant, well-nourished, in no distress,  normal affect  ENT: No lesions,  mouth clear,  oropharynx clear, no postnasal drip  Neck: No JVD, no TMG, no carotid bruits  Lungs: No use of accessory muscles, no dullness to percussion, scattered  Rhonchi,   Exp wheezes  Cardiovascular: RRR, heart sounds normal, no murmur or gallops, no peripheral edema  Abdomen: soft and NT, no  HSM,  BS normal  Musculoskeletal: No deformities, no cyanosis or clubbing  Neuro: alert, non  focal  Skin: Warm, no lesions or rashes        Assessment & Plan:   C O P D Gold stage C. COPD with moderate emphysema now with recurrent  Exacerbation Plan Avelox one daily for 5days Prednisone 10mg  Take 4 for three days 3 for three days 2 for three days then one daily and stay Add Spiriva daily Stay on Advair Get an overnight sleep oximetry study No other medication changes Return 2 months      Updated Medication List Outpatient Encounter Prescriptions as of 03/27/2012  Medication Sig Dispense Refill  . albuterol (PROAIR HFA) 108 (90 BASE) MCG/ACT inhaler Inhale 2 puffs into the lungs every 4 (four) hours as needed.  1 Inhaler  6  . amLODipine (NORVASC) 5 MG tablet TAKE 1 TABLET BY MOUTH EVERY DAY  30 tablet  6  . aspirin 81 MG tablet Take 81 mg by mouth daily.        . carvedilol (COREG) 6.25 MG tablet Take 1 tablet (6.25 mg total) by mouth 2 (two) times daily with a meal.  30 tablet  6  . Fluticasone-Salmeterol (ADVAIR DISKUS) 500-50 MCG/DOSE AEPB Inhale 1 puff into the lungs 2 (two) times daily.  60 each  11  . GuaiFENesin (MUCINEX PO) Take 400 mg by mouth 3 (three) times daily as needed.       Marland Kitchen levothyroxine (SYNTHROID, LEVOTHROID) 112 MCG tablet Take 112 mcg by mouth daily.        Marland Kitchen lovastatin (MEVACOR) 20 MG tablet Take 20 mg by mouth at bedtime.        . predniSONE (DELTASONE) 10 MG tablet Take 4 for three days 3 for three days 2 for three days then one daily  60 tablet  6  . scopolamine (HYOSCINE) 0.25 % ophthalmic solution Place 1 drop into the left eye as needed.       Marland Kitchen DISCONTD: predniSONE (DELTASONE) 10 MG tablet Take 4 for three days 3 for three days 2 for three days  Then one daily and stay  60 tablet  6  . DISCONTD: predniSONE (DELTASONE) 10 MG tablet Take 10 mg by mouth daily.      Marland Kitchen moxifloxacin (AVELOX) 400 MG tablet Take 1 tablet (400 mg total) by mouth daily.   5 tablet  0  . tiotropium (SPIRIVA HANDIHALER) 18 MCG inhalation capsule Place 1 capsule (18 mcg total) into inhaler and inhale daily.  30 capsule  12

## 2012-03-27 NOTE — Patient Instructions (Signed)
Avelox one daily for 5days Prednisone 10mg  Take 4 for three days 3 for three days 2 for three days then one daily and stay Add Spiriva daily Stay on Advair Get an overnight sleep oximetry study No other medication changes Return 2 months

## 2012-03-27 NOTE — Assessment & Plan Note (Signed)
Gold stage C. COPD with moderate emphysema now with recurrent  Exacerbation Plan Avelox one daily for 5days Prednisone 10mg  Take 4 for three days 3 for three days 2 for three days then one daily and stay Add Spiriva daily Stay on Advair Get an overnight sleep oximetry study No other medication changes Return 2 months

## 2012-04-04 ENCOUNTER — Ambulatory Visit (INDEPENDENT_AMBULATORY_CARE_PROVIDER_SITE_OTHER): Payer: Medicare Other | Admitting: Cardiology

## 2012-04-04 ENCOUNTER — Encounter: Payer: Self-pay | Admitting: Cardiology

## 2012-04-04 VITALS — BP 128/70 | HR 91 | Ht 68.0 in | Wt 164.0 lb

## 2012-04-04 DIAGNOSIS — J449 Chronic obstructive pulmonary disease, unspecified: Secondary | ICD-10-CM

## 2012-04-04 DIAGNOSIS — I2510 Atherosclerotic heart disease of native coronary artery without angina pectoris: Secondary | ICD-10-CM

## 2012-04-04 DIAGNOSIS — I10 Essential (primary) hypertension: Secondary | ICD-10-CM

## 2012-04-04 DIAGNOSIS — J4489 Other specified chronic obstructive pulmonary disease: Secondary | ICD-10-CM

## 2012-04-04 NOTE — Patient Instructions (Signed)
Continue all current medications. Your physician wants you to follow up in:  1 year.  You will receive a reminder letter in the mail one-two months in advance.  If you don't receive a letter, please call our office to schedule the follow up appointment   

## 2012-04-24 ENCOUNTER — Other Ambulatory Visit: Payer: Self-pay | Admitting: Cardiology

## 2012-04-28 NOTE — Assessment & Plan Note (Signed)
Blood pressure now well controlled.  Continue medical therapy 

## 2012-04-28 NOTE — Assessment & Plan Note (Signed)
Followed on a regular basis by Dr. Delford Field. Bystolic switched to carvedilol, patient tolerating well.

## 2012-04-28 NOTE — Assessment & Plan Note (Signed)
No recurrent chest pain.  Continue medical therapy.  Continue risk factor modification

## 2012-04-28 NOTE — Progress Notes (Signed)
Willie Bottoms, MD, [facility] Board Certified in Adult Cardiovascular Medicine,Internal Medicine and Critical Care Medicine    CC: followup patient with nonobstructive coronary artery disease  HPI:  The patient is a 78 year old male with history of COPD, bladder cancer, nonobstructive coronary disease with normal LV function.  He has severe dyspnea secondary to COPD/emphysema with salt closely by Dr. Danise Mina.  The patient is doing well from a cardiac perspective.  He denies any chest pain.  He has stable shortness of breath from his underlying lung disease.  He reports no orthopnea or PND.  He has no palpitations and no symptoms of claudication.  PMH: reviewed and listed in Problem List in Electronic Records (and see below) Past Medical History  Diagnosis Date  . Unspecified essential hypertension   . Shortness of breath   . Acute bronchitis   . Other acute sinusitis   . Malignant neoplasm of bladder, part unspecified   . Other and unspecified hyperlipidemia   . Coronary atherosclerosis of unspecified type of vessel, native or graft   . Other emphysema   . Chronic airway obstruction, not elsewhere classified   . Hypothyroidism    Past Surgical History  Procedure Date  . Turp vaporization   . Bladder surgery     Allergies/SH/FHX : available in Electronic Records for review  Allergies  Allergen Reactions  . Penicillins     REACTION: rash   History   Social History  . Marital Status: Married    Spouse Name: N/A    Number of Children: N/A  . Years of Education: N/A   Occupational History  . retired    Social History Main Topics  . Smoking status: Former Smoker -- 1.5 packs/day for 48 years    Types: Cigarettes    Quit date: 11/20/1996  . Smokeless tobacco: Never Used  . Alcohol Use: Not on file  . Drug Use: Not on file  . Sexually Active: Not on file   Other Topics Concern  . Not on file   Social History Narrative  . No narrative on file   No family  history on file.  Medications: Current Outpatient Prescriptions  Medication Sig Dispense Refill  . albuterol (PROAIR HFA) 108 (90 BASE) MCG/ACT inhaler Inhale 2 puffs into the lungs every 4 (four) hours as needed.  1 Inhaler  6  . amLODipine (NORVASC) 5 MG tablet TAKE 1 TABLET BY MOUTH EVERY DAY  30 tablet  6  . aspirin 81 MG tablet Take 81 mg by mouth daily.        . Fluticasone-Salmeterol (ADVAIR DISKUS) 500-50 MCG/DOSE AEPB Inhale 1 puff into the lungs 2 (two) times daily.  60 each  11  . GuaiFENesin (MUCINEX PO) Take 400 mg by mouth 3 (three) times daily as needed.       Marland Kitchen levothyroxine (SYNTHROID, LEVOTHROID) 112 MCG tablet Take 112 mcg by mouth daily.        Marland Kitchen lovastatin (MEVACOR) 20 MG tablet Take 20 mg by mouth at bedtime.        . predniSONE (DELTASONE) 10 MG tablet Take 10 mg by mouth daily.      Marland Kitchen scopolamine (HYOSCINE) 0.25 % ophthalmic solution Place 1 drop into the left eye as needed.       . tiotropium (SPIRIVA HANDIHALER) 18 MCG inhalation capsule Place 1 capsule (18 mcg total) into inhaler and inhale daily.  30 capsule  12  . carvedilol (COREG) 6.25 MG tablet TAKE 1 TABLET  BY MOUTH TWICE A DAY WITH A MEAL  30 tablet  6    ROS: No nausea or vomiting. No fever or chills.No melena or hematochezia.No bleeding.No claudication  Physical Exam: BP 128/70  Pulse 91  Ht 5\' 8"  (1.727 m)  Wt 164 lb (74.39 kg)  BMI 24.94 kg/m2  SpO2 93% General:well-nourished white male in no distress. Neck:normal carotid upstroke and no carotid bruits.  No thyromegaly non-nodular thyroid.  JVP is 6 cm Lungs:diminished breath sounds bilaterally but no wheezing Cardiac:regular rate and rhythm with normal S1 and S2 with no murmur rubs or gallops Vascular:no edema. Skin:warm and dry Physcologic:normal affect  12lead [id number] sinus rhythm with first-degree AV block and left anterior hemiblock. Limited bedside ECHO:N/A No images are attached to the encounter.   Assessment and Plan  CORONARY  HEART DISEASE No recurrent chest pain.  Continue medical therapy.  Continue risk factor modification  C O P D Followed on a regular basis by Dr. Delford Field. Bystolic switched to carvedilol, patient tolerating well.  HYPERTENSION, UNSPECIFIED Blood pressure now well controlled.  Continue medical therapy.    Patient Active Problem List  Diagnoses  . TINEA CORPORIS  . BLADDER CANCER  . HYPERLIPIDEMIA  . HYPERTENSION, UNSPECIFIED  . CORONARY HEART DISEASE  . EMPHYSEMA  . C O P D

## 2012-05-06 ENCOUNTER — Telehealth: Payer: Self-pay | Admitting: Critical Care Medicine

## 2012-05-06 NOTE — Telephone Encounter (Signed)
Will forward to [facility] to check on this. 

## 2012-05-07 NOTE — Telephone Encounter (Signed)
Order was given to [facility] 03/27/12 by rhonda cobb a [facility] it has now been faxed to [facility] again and pt is aware Tobe Sos

## 2012-05-21 ENCOUNTER — Telehealth: Payer: Self-pay | Admitting: Critical Care Medicine

## 2012-05-21 DIAGNOSIS — J449 Chronic obstructive pulmonary disease, unspecified: Secondary | ICD-10-CM

## 2012-05-21 NOTE — Telephone Encounter (Signed)
Order faxed to [facility] to start 02 hs Willie Williamson ° °

## 2012-05-21 NOTE — Telephone Encounter (Signed)
ONO positive for multiple desats.  56  75%

## 2012-05-31 ENCOUNTER — Ambulatory Visit (INDEPENDENT_AMBULATORY_CARE_PROVIDER_SITE_OTHER): Payer: Medicare Other | Admitting: Critical Care Medicine

## 2012-05-31 ENCOUNTER — Encounter: Payer: Self-pay | Admitting: Critical Care Medicine

## 2012-05-31 VITALS — BP 112/80 | HR 97 | Temp 98.4°F | Ht 67.0 in | Wt 163.4 lb

## 2012-05-31 DIAGNOSIS — J449 Chronic obstructive pulmonary disease, unspecified: Secondary | ICD-10-CM

## 2012-05-31 MED ORDER — PREDNISONE 10 MG PO TABS: ORAL_TABLET | ORAL | Status: DC

## 2012-05-31 NOTE — Patient Instructions (Addendum)
No change in medications except reduce prednisone to 5mg  daily (1/2 tablet) Return in        4 months

## 2012-05-31 NOTE — Assessment & Plan Note (Addendum)
Gold Stage C Copd CAT 21 Plan Reduce pred to 5mg  /d Note pt intolerant of daliresp

## 2012-05-31 NOTE — Progress Notes (Signed)
Subjective:    Patient ID: Willie Williamson, male    DOB: 09-10-1931, 78 y.o.   MRN: [id number]  HPI  This is a 78 y.o.  white male with history of chronic obstructive lung disease. He has had bladder cancer with tumors removed from the bladder. Prostatic hypertrophy surgery is also been performed. Both of these surgeries were done at [place], [place] by urology at that site.   05/31/2012 Pt notes exac since last OV.  Pt notes rx pred pulse.   Now is better.  Now is back to baseline. Pt denies any significant sore throat, nasal congestion or excess secretions, fever, chills, sweats, unintended weight loss, pleurtic or exertional chest pain, orthopnea PND, or leg swelling Pt denies any increase in rescue therapy over baseline, denies waking up needing it or having any early am or nocturnal exacerbations of coughing/wheezing/or dyspnea. Pt also denies any obvious fluctuation in symptoms with  weather or environmental change or other alleviating or aggravating factors   Past Medical History  Diagnosis Date  . Unspecified essential hypertension   . Shortness of breath   . Acute bronchitis   . Other acute sinusitis   . Malignant neoplasm of bladder, part unspecified   . Other and unspecified hyperlipidemia   . Coronary atherosclerosis of unspecified type of vessel, native or graft   . Other emphysema   . Chronic airway obstruction, not elsewhere classified   . Hypothyroidism      No family history on file.   History   Social History  . Marital Status: Married    Spouse Name: N/A    Number of Children: N/A  . Years of Education: N/A   Occupational History  . retired    Social History Main Topics  . Smoking status: Former Smoker -- 1.5 packs/day for 48 years    Types: Cigarettes    Quit date: 11/20/1996  . Smokeless tobacco: Never Used  . Alcohol Use: Not on file  . Drug Use: Not on file  . Sexually Active: Not on file   Other Topics Concern  . Not on file   Social  History Narrative  . No narrative on file     Allergies  Allergen Reactions  . Penicillins     REACTION: rash     Outpatient Prescriptions Prior to Visit  Medication Sig Dispense Refill  . amLODipine (NORVASC) 5 MG tablet TAKE 1 TABLET BY MOUTH EVERY DAY  30 tablet  6  . aspirin 81 MG tablet Take 81 mg by mouth daily.        . carvedilol (COREG) 6.25 MG tablet TAKE 1 TABLET BY MOUTH TWICE A DAY WITH A MEAL  30 tablet  6  . Fluticasone-Salmeterol (ADVAIR DISKUS) 500-50 MCG/DOSE AEPB Inhale 1 puff into the lungs 2 (two) times daily.  60 each  11  . GuaiFENesin (MUCINEX PO) Take 400 mg by mouth 3 (three) times daily as needed.       Marland Kitchen levothyroxine (SYNTHROID, LEVOTHROID) 112 MCG tablet Take 112 mcg by mouth daily.        Marland Kitchen lovastatin (MEVACOR) 20 MG tablet Take 20 mg by mouth at bedtime.        Marland Kitchen scopolamine (HYOSCINE) 0.25 % ophthalmic solution Place 1 drop into the left eye as needed.       . tiotropium (SPIRIVA HANDIHALER) 18 MCG inhalation capsule Place 1 capsule (18 mcg total) into inhaler and inhale daily.  30 capsule  12  . predniSONE (DELTASONE)  10 MG tablet Take 10 mg by mouth daily.      Marland Kitchen albuterol (PROAIR HFA) 108 (90 BASE) MCG/ACT inhaler Inhale 2 puffs into the lungs every 4 (four) hours as needed.  1 Inhaler  6     Review of Systems  Constitutional:   No  weight loss, night sweats,  Fevers, chills, fatigue, lassitude. HEENT:   No headaches,  Difficulty swallowing,  Tooth/dental problems,  Sore throat,                No sneezing, itching, ear ache, nasal congestion, post nasal drip,   CV:  No chest pain,  Orthopnea, PND, swelling in lower extremities, anasarca, dizziness, palpitations  GI  No heartburn, indigestion, abdominal pain, nausea, vomiting, diarrhea, change in bowel habits, loss of appetite  Resp: Notes  shortness of breath with exertion or at rest.  No excess mucus, notes  productive cough,  No non-productive cough,  No coughing up of blood.  No change in  color of mucus.  No wheezing.  No chest wall deformity  Skin: no rash or lesions.  GU: no dysuria, change in color of urine, no urgency or frequency.  No flank pain.  MS:  No joint pain or swelling.  No decreased range of motion.  No back pain.  Psych:  No change in mood or affect. No depression or anxiety.  No memory loss.     Objective:   Physical Exam  Filed Vitals:   05/31/12 1347  BP: 112/80  Pulse: 97  Temp: 98.4 F (36.9 C)  TempSrc: Oral  Height: 5\' 7"  (1.702 m)  Weight: 163 lb 6.4 oz (74.118 kg)  SpO2: 92%    Gen: Pleasant, well-nourished, in no distress,  normal affect  ENT: No lesions,  mouth clear,  oropharynx clear, no postnasal drip  Neck: No JVD, no TMG, no carotid bruits  Lungs: No use of accessory muscles, no dullness to percussion, distant BS     Cardiovascular: RRR, heart sounds normal, no murmur or gallops, no peripheral edema  Abdomen: soft and NT, no HSM,  BS normal  Musculoskeletal: No deformities, no cyanosis or clubbing  Neuro: alert, non focal  Skin: Warm, no lesions or rashes        Assessment & Plan:   C O P D Gold Stage C Copd CAT 21 Plan Reduce pred to 5mg  /d Note pt intolerant of daliresp     Updated Medication List Outpatient Encounter Prescriptions as of 05/31/2012  Medication Sig Dispense Refill  . albuterol (PROVENTIL HFA;VENTOLIN HFA) 108 (90 BASE) MCG/ACT inhaler Inhale 2 puffs into the lungs every 4 (four) hours as needed.      Marland Kitchen amLODipine (NORVASC) 5 MG tablet TAKE 1 TABLET BY MOUTH EVERY DAY  30 tablet  6  . aspirin 81 MG tablet Take 81 mg by mouth daily.        . carvedilol (COREG) 6.25 MG tablet TAKE 1 TABLET BY MOUTH TWICE A DAY WITH A MEAL  30 tablet  6  . Fluticasone-Salmeterol (ADVAIR DISKUS) 500-50 MCG/DOSE AEPB Inhale 1 puff into the lungs 2 (two) times daily.  60 each  11  . GuaiFENesin (MUCINEX PO) Take 400 mg by mouth 3 (three) times daily as needed.       Marland Kitchen levothyroxine (SYNTHROID, LEVOTHROID) 112  MCG tablet Take 112 mcg by mouth daily.        Marland Kitchen lovastatin (MEVACOR) 20 MG tablet Take 20 mg by mouth at bedtime.        Marland Kitchen  predniSONE (DELTASONE) 10 MG tablet 1/2 tablet daily      . scopolamine (HYOSCINE) 0.25 % ophthalmic solution Place 1 drop into the left eye as needed.       . tiotropium (SPIRIVA HANDIHALER) 18 MCG inhalation capsule Place 1 capsule (18 mcg total) into inhaler and inhale daily.  30 capsule  12  . DISCONTD: predniSONE (DELTASONE) 10 MG tablet Take 10 mg by mouth daily.      Marland Kitchen albuterol (PROAIR HFA) 108 (90 BASE) MCG/ACT inhaler Inhale 2 puffs into the lungs every 4 (four) hours as needed.  1 Inhaler  6

## 2012-07-29 ENCOUNTER — Other Ambulatory Visit: Payer: Self-pay | Admitting: Cardiology

## 2012-09-11 ENCOUNTER — Ambulatory Visit (INDEPENDENT_AMBULATORY_CARE_PROVIDER_SITE_OTHER)
Admission: RE | Admit: 2012-09-11 | Discharge: 2012-09-11 | Disposition: A | Discharge status: Home / Self Care | Payer: Medicare Other | Source: Ambulatory Visit | Attending: Critical Care Medicine | Admitting: Critical Care Medicine

## 2012-09-11 ENCOUNTER — Ambulatory Visit (INDEPENDENT_AMBULATORY_CARE_PROVIDER_SITE_OTHER): Payer: Medicare Other | Admitting: Critical Care Medicine

## 2012-09-11 ENCOUNTER — Encounter: Payer: Self-pay | Admitting: Critical Care Medicine

## 2012-09-11 VITALS — BP 118/88 | HR 101 | Temp 98.6°F | Ht 67.0 in | Wt 166.2 lb

## 2012-09-11 DIAGNOSIS — J449 Chronic obstructive pulmonary disease, unspecified: Secondary | ICD-10-CM

## 2012-09-11 MED ORDER — MOXIFLOXACIN HCL 400 MG PO TABS: 400.0000 mg | ORAL_TABLET | Freq: Every day | ORAL | Status: DC

## 2012-09-11 MED ORDER — PREDNISONE 10 MG PO TABS: ORAL_TABLET | ORAL | Status: DC

## 2012-09-11 NOTE — Patient Instructions (Addendum)
Chest xray today Prednisone change:  10mg  Take 4 for four days 3 for four days 2 for four days 1 for 4 days then 1/2 daily Avelox for 5days Oxygen 2liter rest 3-4 liter with exertion No change in inhalers Return 2   weeks with Tammy Parrett for recheck

## 2012-09-11 NOTE — Assessment & Plan Note (Addendum)
Golds Stage C  Copd.  Moderate emphysema DLCO 62% Progressive Copd with decline in lung function and active wheezing Note CXR with no active disease on 09/11/2012  Plan Prednisone change:  10mg  Take 4 for four days 3 for four days 2 for four days 1 for 4 days then 1/2 daily Avelox for 5days Oxygen 2liter rest 3-4 liter with exertion No change in inhalers Return 2   weeks with Rubye Oaks for recheck

## 2012-09-11 NOTE — Progress Notes (Signed)
Quick Note:  Called, spoke with pt. Informed him of cxr results and recs per Dr. Delford Field. He verbalized understanding and voiced no further questions/concerns at this time. ______

## 2012-09-11 NOTE — Progress Notes (Signed)
Quick Note:  Notify the patient that the Xray is stable and just shows COPD and no pneumonia No change in medications are recommended. Continue current meds as prescribed at last office visit ______

## 2012-09-11 NOTE — Progress Notes (Signed)
Subjective:    Patient ID: Willie Williamson, male    DOB: 01-Dec-1930, 78 y.o.   MRN: [id number]  HPI  This is a 78 y.o.  white male with history of chronic obstructive lung disease. He has had bladder cancer with tumors removed from the bladder. Prostatic hypertrophy surgery is also been performed. Both of these surgeries were done at [place], [place] by urology at that site.   05/31/2012 Pt notes exac since last OV.  Pt notes rx pred pulse.   Now is better.  Now is back to baseline. Pt denies any significant sore throat, nasal congestion or excess secretions, fever, chills, sweats, unintended weight loss, pleurtic or exertional chest pain, orthopnea PND, or leg swelling Pt denies any increase in rescue therapy over baseline, denies waking up needing it or having any early am or nocturnal exacerbations of coughing/wheezing/or dyspnea. Pt also denies any obvious fluctuation in symptoms with  weather or environmental change or other alleviating or aggravating factors  09/11/2012 Pt is progressing with more dyspnea and cough. Pred has not helped.  Mucus is productive, white.  No real blood.  Notes some edema.  No real chest pain.   Using more oxygen.  Now 24/7 , was only qhs before.   Past Medical History  Diagnosis Date  . Unspecified essential hypertension   . Shortness of breath   . Acute bronchitis   . Other acute sinusitis   . Malignant neoplasm of bladder, part unspecified   . Other and unspecified hyperlipidemia   . Coronary atherosclerosis of unspecified type of vessel, native or graft   . Other emphysema   . Chronic airway obstruction, not elsewhere classified   . Hypothyroidism      No family history on file.   History   Social History  . Marital Status: Married    Spouse Name: N/A    Number of Children: N/A  . Years of Education: N/A   Occupational History  . retired    Social History Main Topics  . Smoking status: Former Smoker -- 1.5 packs/day for 48 years   Types: Cigarettes    Quit date: 11/20/1996  . Smokeless tobacco: Never Used  . Alcohol Use: Not on file  . Drug Use: Not on file  . Sexually Active: Not on file   Other Topics Concern  . Not on file   Social History Narrative  . No narrative on file     Allergies  Allergen Reactions  . Penicillins     REACTION: rash     Outpatient Prescriptions Prior to Visit  Medication Sig Dispense Refill  . albuterol (PROVENTIL HFA;VENTOLIN HFA) 108 (90 BASE) MCG/ACT inhaler Inhale 2 puffs into the lungs every 4 (four) hours as needed.      Marland Kitchen amLODipine (NORVASC) 5 MG tablet TAKE 1 TABLET BY MOUTH EVERY DAY  30 tablet  6  . aspirin 81 MG tablet Take 81 mg by mouth daily.        . carvedilol (COREG) 6.25 MG tablet TAKE 1 TABLET BY MOUTH TWICE A DAY WITH A MEAL  30 tablet  6  . Fluticasone-Salmeterol (ADVAIR DISKUS) 500-50 MCG/DOSE AEPB Inhale 1 puff into the lungs 2 (two) times daily.  60 each  11  . GuaiFENesin (MUCINEX PO) Take 400 mg by mouth 3 (three) times daily as needed.       Marland Kitchen levothyroxine (SYNTHROID, LEVOTHROID) 112 MCG tablet Take 112 mcg by mouth daily.        Marland Kitchen  lovastatin (MEVACOR) 20 MG tablet Take 20 mg by mouth at bedtime.        Marland Kitchen scopolamine (HYOSCINE) 0.25 % ophthalmic solution Place 1 drop into the left eye as needed.       . tiotropium (SPIRIVA HANDIHALER) 18 MCG inhalation capsule Place 1 capsule (18 mcg total) into inhaler and inhale daily.  30 capsule  12  . predniSONE (DELTASONE) 10 MG tablet 1/2 tablet daily      . albuterol (PROAIR HFA) 108 (90 BASE) MCG/ACT inhaler Inhale 2 puffs into the lungs every 4 (four) hours as needed.  1 Inhaler  6     Review of Systems  Constitutional:   No  weight loss, night sweats,  Fevers, chills, fatigue, lassitude. HEENT:   No headaches,  Difficulty swallowing,  Tooth/dental problems,  Sore throat,                No sneezing, itching, ear ache, nasal congestion, post nasal drip,   CV:  No chest pain,  Orthopnea, PND, swelling in  lower extremities, anasarca, dizziness, palpitations  GI  No heartburn, indigestion, abdominal pain, nausea, vomiting, diarrhea, change in bowel habits, loss of appetite  Resp: Notes  shortness of breath with exertion and  at rest.  No excess mucus, notes  productive cough,  No non-productive cough,  No coughing up of blood.  No change in color of mucus.  No wheezing.  No chest wall deformity  Skin: no rash or lesions.  GU: no dysuria, change in color of urine, no urgency or frequency.  No flank pain.  MS:  No joint pain or swelling.  No decreased range of motion.  No back pain.  Psych:  No change in mood or affect. No depression or anxiety.  No memory loss.     Objective:   Physical Exam  Filed Vitals:   09/11/12 0956  BP: 118/88  Pulse: 101  Temp: 98.6 F (37 C)  TempSrc: Oral  Height: 5\' 7"  (1.702 m)  Weight: 166 lb 3.2 oz (75.388 kg)  SpO2: 96%    Gen: Pleasant, well-nourished, in no distress,  normal affect  ENT: No lesions,  mouth clear,  oropharynx clear, no postnasal drip  Neck: No JVD, no TMG, no carotid bruits  Lungs: No use of accessory muscles, no dullness to percussion, distant BS , exp wheeze. Poor airflow    Cardiovascular: RRR, heart sounds normal, no murmur or gallops, no peripheral edema  Abdomen: soft and NT, no HSM,  BS normal  Musculoskeletal: No deformities, no cyanosis or clubbing  Neuro: alert, non focal  Skin: Warm, no lesions or rashes   Dg Chest 2 View  09/11/2012  *RADIOLOGY REPORT*  Clinical Data: COPD  CHEST - 2 VIEW  Comparison: 10/1/ 12  Findings: Cardiomediastinal silhouette is stable.  Mild hyperinflation again noted.  Bony thorax is stable.  No acute infiltrate or pleural effusion.  IMPRESSION: Stable COPD.  No active disease.   Original Report Authenticated By: Natasha Mead, M.D.         Assessment & Plan:   C O P D Golds C Golds Stage C  Copd.  Moderate emphysema DLCO 62% Progressive Copd with decline in lung function and  active wheezing Note CXR with no active disease on 09/11/2012  Plan Prednisone change:  10mg  Take 4 for four days 3 for four days 2 for four days 1 for 4 days then 1/2 daily Avelox for 5days Oxygen 2liter rest 3-4 liter with exertion  No change in inhalers Return 2   weeks with Rubye Oaks for recheck     Updated Medication List Outpatient Encounter Prescriptions as of 09/11/2012  Medication Sig Dispense Refill  . albuterol (PROVENTIL HFA;VENTOLIN HFA) 108 (90 BASE) MCG/ACT inhaler Inhale 2 puffs into the lungs every 4 (four) hours as needed.      Marland Kitchen amLODipine (NORVASC) 5 MG tablet TAKE 1 TABLET BY MOUTH EVERY DAY  30 tablet  6  . aspirin 81 MG tablet Take 81 mg by mouth daily.        . carvedilol (COREG) 6.25 MG tablet TAKE 1 TABLET BY MOUTH TWICE A DAY WITH A MEAL  30 tablet  6  . Fluticasone-Salmeterol (ADVAIR DISKUS) 500-50 MCG/DOSE AEPB Inhale 1 puff into the lungs 2 (two) times daily.  60 each  11  . furosemide (LASIX) 20 MG tablet Take 20 mg by mouth daily.      . GuaiFENesin (MUCINEX PO) Take 400 mg by mouth 3 (three) times daily as needed.       Marland Kitchen levothyroxine (SYNTHROID, LEVOTHROID) 112 MCG tablet Take 112 mcg by mouth daily.        Marland Kitchen lovastatin (MEVACOR) 20 MG tablet Take 20 mg by mouth at bedtime.        . predniSONE (DELTASONE) 10 MG tablet Take 4 for four days 3 for four days 2 for four days 1 for four days then 1/2 daily  60 tablet  6  . scopolamine (HYOSCINE) 0.25 % ophthalmic solution Place 1 drop into the left eye as needed.       . tiotropium (SPIRIVA HANDIHALER) 18 MCG inhalation capsule Place 1 capsule (18 mcg total) into inhaler and inhale daily.  30 capsule  12  . DISCONTD: predniSONE (DELTASONE) 10 MG tablet 1/2 tablet daily      . albuterol (PROAIR HFA) 108 (90 BASE) MCG/ACT inhaler Inhale 2 puffs into the lungs every 4 (four) hours as needed.  1 Inhaler  6  . moxifloxacin (AVELOX) 400 MG tablet Take 1 tablet (400 mg total) by mouth daily.  5 tablet  0

## 2012-09-25 ENCOUNTER — Ambulatory Visit: Payer: Medicare Other | Admitting: Adult Health

## 2012-12-13 ENCOUNTER — Other Ambulatory Visit: Payer: Self-pay | Admitting: Cardiology

## 2013-02-07 ENCOUNTER — Other Ambulatory Visit: Payer: Self-pay | Admitting: Critical Care Medicine

## 2013-04-17 ENCOUNTER — Other Ambulatory Visit: Payer: Self-pay | Admitting: Physician Assistant

## 2013-05-18 ENCOUNTER — Other Ambulatory Visit: Payer: Self-pay | Admitting: Cardiology

## 2013-06-12 ENCOUNTER — Other Ambulatory Visit: Payer: Self-pay | Admitting: Critical Care Medicine

## 2013-06-16 ENCOUNTER — Ambulatory Visit (INDEPENDENT_AMBULATORY_CARE_PROVIDER_SITE_OTHER): Payer: Medicare Other | Admitting: Cardiovascular Disease

## 2013-06-16 ENCOUNTER — Encounter: Payer: Self-pay | Admitting: Cardiovascular Disease

## 2013-06-16 VITALS — BP 164/83 | HR 80 | Ht 67.0 in | Wt 163.0 lb

## 2013-06-16 DIAGNOSIS — J449 Chronic obstructive pulmonary disease, unspecified: Secondary | ICD-10-CM

## 2013-06-16 DIAGNOSIS — I10 Essential (primary) hypertension: Secondary | ICD-10-CM

## 2013-06-16 DIAGNOSIS — J4489 Other specified chronic obstructive pulmonary disease: Secondary | ICD-10-CM

## 2013-06-16 DIAGNOSIS — I2510 Atherosclerotic heart disease of native coronary artery without angina pectoris: Secondary | ICD-10-CM

## 2013-06-16 MED ORDER — CARVEDILOL 12.5 MG PO TABS: 12.5000 mg | ORAL_TABLET | Freq: Two times a day (BID) | ORAL | Status: DC

## 2013-06-16 NOTE — Patient Instructions (Addendum)
Your physician recommends that you schedule a follow-up appointment in: 1 year. You will receive a reminder letter in the mail in about 10 months reminding you to call and schedule your appointment. If you don't receive this letter, please contact our office. Your physician recommends that you continue on your current medications as directed. Please refer to the Current Medication list given to you today. Your physician has requested that you regularly monitor and record your blood pressure readings at home. Please use the same machine at the same time of day to check your readings and record them. Please call our office in 10 days with your readings.

## 2013-06-16 NOTE — Progress Notes (Signed)
Patient ID: Willie Williamson, male   DOB: October 09, 1931, 79 y.o.   MRN: [id number]    SUBJECTIVE: Willie Williamson is an 79 year old male with a history of COPD, bladder cancer, nonobstructive coronary disease with normal LV function. He has severe dyspnea secondary to COPD/emphysema  And is followed closely by Dr. Danise Mina.   The patient is doing well from a cardiac perspective. He denies any chest pain. He has stable shortness of breath from his underlying lung disease. He reports no orthopnea or PND. He has no palpitations and no symptoms of claudication.   He checked his BP daily during the month of June and says his BP was always normal. Since then, he's been checking it every other day and it runs 120/70 mmHg range.    PMH: reviewed and listed in Problem List in Electronic Records (and see below)  Past Medical History   Diagnosis  Date   .  Unspecified essential hypertension    .  Shortness of breath    .  Acute bronchitis    .  Other acute sinusitis    .  Malignant neoplasm of bladder, part unspecified    .  Other and unspecified hyperlipidemia    .  Coronary atherosclerosis of unspecified type of vessel, native or graft    .  Other emphysema    .  Chronic airway obstruction, not elsewhere classified    .  Hypothyroidism     Past Surgical History   Procedure  Date   .  Turp vaporization    .  Bladder surgery    Allergies/SH/FHX : available in Electronic Records for review  Allergies   Allergen  Reactions   .  Penicillins      REACTION: rash     BP: 164/83  Pulse: 80   PHYSICAL EXAM General: NAD, wearing oxygen Neck: No JVD, no thyromegaly or thyroid nodule.  Lungs: Distant breath sounds bilaterally CV: distant heart tones obscured by lungs Abdomen: Soft, nontender, no hepatosplenomegaly, no distention.  Neurologic: Alert and oriented x 3.  Psych: Normal affect. Extremities: No clubbing or cyanosis.     LABS: Basic Metabolic Panel: No results found for this  basename: NA, K, CL, CO2, GLUCOSE, BUN, CREATININE, CALCIUM, MG, PHOS,  in the last 72 hours Liver Function Tests: No results found for this basename: AST, ALT, ALKPHOS, BILITOT, PROT, ALBUMIN,  in the last 72 hours No results found for this basename: LIPASE, AMYLASE,  in the last 72 hours CBC: No results found for this basename: WBC, NEUTROABS, HGB, HCT, MCV, PLT,  in the last 72 hours Cardiac Enzymes: No results found for this basename: CKTOTAL, CKMB, CKMBINDEX, TROPONINI,  in the last 72 hours BNP: No components found with this basename: POCBNP,  D-Dimer: No results found for this basename: DDIMER,  in the last 72 hours Hemoglobin A1C: No results found for this basename: HGBA1C,  in the last 72 hours Fasting Lipid Panel: No results found for this basename: CHOL, HDL, LDLCALC, TRIG, CHOLHDL, LDLDIRECT,  in the last 72 hours Thyroid Function Tests: No results found for this basename: TSH, T4TOTAL, FREET3, T3FREE, THYROIDAB,  in the last 72 hours Anemia Panel: No results found for this basename: VITAMINB12, FOLATE, FERRITIN, TIBC, IRON, RETICCTPCT,  in the last 72 hours  ECG: NSR, 80 bpm, LAFB    ASSESSMENT AND PLAN:  CORONARY HEART DISEASE  No recurrent chest pain. Continue medical therapy. Continue risk factor modification   C O P D  Followed  on a regular basis by Dr. Delford Field. On carvedilol, patient tolerating well.   HYPERTENSION Blood pressure uncontrolled. I've asked him to check it daily for the next 10 days and if it remains elevated, I will increase Amlodipine to 10 mg daily. He takes Coreg once daily. He says it was dispensed this way but is unsure why. We will check with his pharmacy.    Willie Williamson, M.D., F.A.C.C.

## 2013-07-26 ENCOUNTER — Other Ambulatory Visit: Payer: Self-pay | Admitting: Physician Assistant

## 2013-08-17 ENCOUNTER — Other Ambulatory Visit: Payer: Self-pay | Admitting: Physician Assistant

## 2013-11-28 ENCOUNTER — Other Ambulatory Visit: Payer: Self-pay | Admitting: Critical Care Medicine

## 2013-11-28 NOTE — Telephone Encounter (Signed)
Last OV with PW: 08/2012 Pending OV on 12/11/13 Rx sent x 1 with msg to pls keep pending ov for further refills.

## 2013-12-11 ENCOUNTER — Encounter: Payer: Self-pay | Admitting: Critical Care Medicine

## 2013-12-11 ENCOUNTER — Ambulatory Visit (INDEPENDENT_AMBULATORY_CARE_PROVIDER_SITE_OTHER): Payer: Medicare HMO | Admitting: Critical Care Medicine

## 2013-12-11 ENCOUNTER — Encounter (INDEPENDENT_AMBULATORY_CARE_PROVIDER_SITE_OTHER): Payer: Self-pay

## 2013-12-11 VITALS — BP 138/74 | HR 104 | Temp 97.5°F | Ht 67.0 in | Wt 155.0 lb

## 2013-12-11 DIAGNOSIS — J449 Chronic obstructive pulmonary disease, unspecified: Secondary | ICD-10-CM

## 2013-12-11 DIAGNOSIS — J9610 Chronic respiratory failure, unspecified whether with hypoxia or hypercapnia: Secondary | ICD-10-CM

## 2013-12-11 MED ORDER — FLUTICASONE-SALMETEROL 500-50 MCG/DOSE IN AEPB: INHALATION_SPRAY | RESPIRATORY_TRACT | Status: DC

## 2013-12-11 MED ORDER — PREDNISONE 10 MG PO TABS: ORAL_TABLET | ORAL | Status: DC

## 2013-12-11 MED ORDER — LEVOFLOXACIN 500 MG PO TABS: 500.0000 mg | ORAL_TABLET | Freq: Every day | ORAL | Status: DC

## 2013-12-11 MED ORDER — ALBUTEROL SULFATE HFA 108 (90 BASE) MCG/ACT IN AERS: 2.0000 | INHALATION_SPRAY | RESPIRATORY_TRACT | Status: DC | PRN

## 2013-12-11 MED ORDER — FLUTICASONE PROPIONATE 50 MCG/ACT NA SUSP: 2.0000 | Freq: Every day | NASAL | Status: DC

## 2013-12-11 NOTE — Progress Notes (Signed)
Subjective:    Patient ID: Willie Williamson, male    DOB: 1931/02/11, 80 y.o.   MRN: [id number]  HPI  This is a 80 y.o.  white male with history of chronic obstructive lung disease. He has had bladder cancer with tumors removed from the bladder. Prostatic hypertrophy surgery is also been performed. Both of these surgeries were done at [place], [place] by urology at that site.   12/11/2013 Chief Complaint  Patient presents with  . Follow-up    Pt c/o prod cough with white mucous, SOB with exertion, states he is having a "flare up" since Sunday.   Not seen since 2013.  On oxygen now.  No hosp in 2014.  PCP rx : 4 x had flare up with depo inj, pred pulse Last episode 09/2013.   CUrrent started 4 days ago Notes more dyspnea, wheezing, more mucus, white. No cp.  No f/c/s.  No real nasal congestion  On 2L continuous oxygen This patient continues to use and benefit from her oxygen therapy and has re qualified at this visit for continued oxygen therapy  Past Medical History  Diagnosis Date  . Unspecified essential hypertension   . Shortness of breath   . Acute bronchitis   . Other acute sinusitis   . Malignant neoplasm of bladder, part unspecified   . Other and unspecified hyperlipidemia   . Coronary atherosclerosis of unspecified type of vessel, native or graft   . Other emphysema   . Chronic airway obstruction, not elsewhere classified   . Hypothyroidism      No family history on file.   History   Social History  . Marital Status: Married    Spouse Name: N/A    Number of Children: N/A  . Years of Education: N/A   Occupational History  . retired    Social History Main Topics  . Smoking status: Former Smoker -- 1.50 packs/day for 48 years    Types: Cigarettes    Quit date: 11/20/1996  . Smokeless tobacco: Never Used  . Alcohol Use: Not on file  . Drug Use: Not on file  . Sexual Activity: Not on file   Other Topics Concern  . Not on file   Social History  Narrative  . No narrative on file     Allergies  Allergen Reactions  . Penicillins     REACTION: rash     Outpatient Prescriptions Prior to Visit  Medication Sig Dispense Refill  . amLODipine (NORVASC) 5 MG tablet TAKE 1 TABLET EVERY DAY  30 tablet  6  . aspirin 81 MG tablet Take 81 mg by mouth daily.        . GuaiFENesin (MUCINEX PO) Take 400 mg by mouth 3 (three) times daily as needed.       Marland Kitchen levothyroxine (SYNTHROID, LEVOTHROID) 88 MCG tablet Take 88 mcg by mouth daily before breakfast.      . lovastatin (MEVACOR) 20 MG tablet Take 20 mg by mouth at bedtime.        Marland Kitchen scopolamine (HYOSCINE) 0.25 % ophthalmic solution Place 1 drop into the left eye as needed.       . timolol (BETIMOL) 0.5 % ophthalmic solution Place 1 drop into the left eye as needed.      Marland Kitchen ADVAIR DISKUS 500-50 MCG/DOSE AEPB INHALE 1 PUFF TWICE A DAY  60 each  0  . albuterol (PROVENTIL HFA;VENTOLIN HFA) 108 (90 BASE) MCG/ACT inhaler Inhale 2 puffs into the lungs every 4 (four)  hours as needed.      . carvedilol (COREG) 6.25 MG tablet Take 2 tablets (12.5 mg total) by mouth 2 (two) times daily with a meal.  120 tablet  3  . carvedilol (COREG) 12.5 MG tablet Take 1 tablet (12.5 mg total) by mouth 2 (two) times daily with a meal.  60 tablet  6   No facility-administered medications prior to visit.     Review of Systems  Constitutional:   No  weight loss, night sweats,  Fevers, chills, fatigue, lassitude. HEENT:   No headaches,  Difficulty swallowing,  Tooth/dental problems,  Sore throat,                No sneezing, itching, ear ache, nasal congestion, post nasal drip,   CV:  No chest pain,  Orthopnea, PND, swelling in lower extremities, anasarca, dizziness, palpitations  GI  No heartburn, indigestion, abdominal pain, nausea, vomiting, diarrhea, change in bowel habits, loss of appetite  Resp: Notes  shortness of breath with exertion and  at rest.  No excess mucus, notes  productive cough,  No non-productive  cough,  No coughing up of blood.  No change in color of mucus.  No wheezing.  No chest wall deformity  Skin: no rash or lesions.  GU: no dysuria, change in color of urine, no urgency or frequency.  No flank pain.  MS:  No joint pain or swelling.  No decreased range of motion.  No back pain.  Psych:  No change in mood or affect. No depression or anxiety.  No memory loss.     Objective:   Physical Exam  Filed Vitals:   12/11/13 0856  BP: 138/74  Pulse: 104  Temp: 97.5 F (36.4 C)  TempSrc: Oral  Height: 5\' 7"  (1.702 m)  Weight: 155 lb (70.308 kg)  SpO2: 95%    Gen: Pleasant, well-nourished, in no distress,  normal affect  ENT: No lesions,  mouth clear,  oropharynx clear, no postnasal drip  Neck: No JVD, no TMG, no carotid bruits  Lungs: No use of accessory muscles, no dullness to percussion, distant BS , exp wheeze. Poor airflow    Cardiovascular: RRR, heart sounds normal, no murmur or gallops, no peripheral edema  Abdomen: soft and NT, no HSM,  BS normal  Musculoskeletal: No deformities, no cyanosis or clubbing  Neuro: alert, non focal  Skin: Warm, no lesions or rashes   No results found.      Assessment & Plan:   C O P D [place] C  COPD moderate emphysema now with mild exacerbation  Plan Levaquin one daily for 7days Prednisone 10mg  Take 4 tablets daily for 5 days then stop Humidity for concentrator Saline nasal spray three times daily Refills sent  Return 4 months     Updated Medication List Outpatient Encounter Prescriptions as of 12/11/2013  Medication Sig  . albuterol (PROVENTIL HFA;VENTOLIN HFA) 108 (90 BASE) MCG/ACT inhaler Inhale 2 puffs into the lungs every 4 (four) hours as needed.  Marland Kitchen amLODipine (NORVASC) 5 MG tablet TAKE 1 TABLET EVERY DAY  . aspirin 81 MG tablet Take 81 mg by mouth daily.    . carvedilol (COREG) 6.25 MG tablet Take 6.25 mg by mouth 2 (two) times daily with a meal.  . Fluticasone-Salmeterol (ADVAIR DISKUS) 500-50 MCG/DOSE  AEPB INHALE 1 PUFF TWICE A DAY  . GuaiFENesin (MUCINEX PO) Take 400 mg by mouth 3 (three) times daily as needed.   Marland Kitchen levothyroxine (SYNTHROID, LEVOTHROID) 88 MCG tablet Take  88 mcg by mouth daily before breakfast.  . lovastatin (MEVACOR) 20 MG tablet Take 20 mg by mouth at bedtime.    Marland Kitchen scopolamine (HYOSCINE) 0.25 % ophthalmic solution Place 1 drop into the left eye as needed.   . timolol (BETIMOL) 0.5 % ophthalmic solution Place 1 drop into the left eye as needed.  . [DISCONTINUED] ADVAIR DISKUS 500-50 MCG/DOSE AEPB INHALE 1 PUFF TWICE A DAY  . [DISCONTINUED] albuterol (PROVENTIL HFA;VENTOLIN HFA) 108 (90 BASE) MCG/ACT inhaler Inhale 2 puffs into the lungs every 4 (four) hours as needed.  . [DISCONTINUED] carvedilol (COREG) 6.25 MG tablet Take 2 tablets (12.5 mg total) by mouth 2 (two) times daily with a meal.  . fluticasone (FLONASE) 50 MCG/ACT nasal spray Place 2 sprays into both nostrils daily.  Marland Kitchen levofloxacin (LEVAQUIN) 500 MG tablet Take 1 tablet (500 mg total) by mouth daily.  . predniSONE (DELTASONE) 10 MG tablet Take 4 tablets daily for 5 days then stop  . [DISCONTINUED] carvedilol (COREG) 12.5 MG tablet Take 1 tablet (12.5 mg total) by mouth 2 (two) times daily with a meal.

## 2013-12-11 NOTE — Patient Instructions (Signed)
Levaquin one daily for 7days Prednisone 10mg  Take 4 tablets daily for 5 days then stop Humidity for concentrator Saline nasal spray three times daily Refills sent  Return 4 months

## 2013-12-12 NOTE — Assessment & Plan Note (Addendum)
COPD moderate emphysema now with mild exacerbation  Plan Levaquin one daily for 7days Prednisone 10mg  Take 4 tablets daily for 5 days then stop Humidity for concentrator Saline nasal spray three times daily Refills sent  Return 4 months

## 2014-03-13 ENCOUNTER — Encounter (HOSPITAL_COMMUNITY): Payer: Self-pay | Admitting: Emergency Medicine

## 2014-03-13 ENCOUNTER — Inpatient Hospital Stay (HOSPITAL_COMMUNITY)
Admission: EM | Admit: 2014-03-13 | Discharge: 2014-03-16 | DRG: 191 | Disposition: A | Discharge status: Home / Self Care | Payer: Medicare HMO | Attending: Internal Medicine | Admitting: Internal Medicine

## 2014-03-13 ENCOUNTER — Emergency Department (HOSPITAL_COMMUNITY): Payer: Medicare HMO

## 2014-03-13 DIAGNOSIS — I2510 Atherosclerotic heart disease of native coronary artery without angina pectoris: Secondary | ICD-10-CM | POA: Diagnosis present

## 2014-03-13 DIAGNOSIS — J439 Emphysema, unspecified: Secondary | ICD-10-CM | POA: Diagnosis present

## 2014-03-13 DIAGNOSIS — Z7982 Long term (current) use of aspirin: Secondary | ICD-10-CM

## 2014-03-13 DIAGNOSIS — Z9981 Dependence on supplemental oxygen: Secondary | ICD-10-CM

## 2014-03-13 DIAGNOSIS — I951 Orthostatic hypotension: Secondary | ICD-10-CM | POA: Diagnosis present

## 2014-03-13 DIAGNOSIS — I10 Essential (primary) hypertension: Secondary | ICD-10-CM | POA: Diagnosis present

## 2014-03-13 DIAGNOSIS — E039 Hypothyroidism, unspecified: Secondary | ICD-10-CM | POA: Diagnosis present

## 2014-03-13 DIAGNOSIS — E872 Acidosis, unspecified: Secondary | ICD-10-CM | POA: Diagnosis present

## 2014-03-13 DIAGNOSIS — E861 Hypovolemia: Secondary | ICD-10-CM | POA: Diagnosis present

## 2014-03-13 DIAGNOSIS — R42 Dizziness and giddiness: Secondary | ICD-10-CM | POA: Diagnosis present

## 2014-03-13 DIAGNOSIS — Z87891 Personal history of nicotine dependence: Secondary | ICD-10-CM

## 2014-03-13 DIAGNOSIS — J441 Chronic obstructive pulmonary disease with (acute) exacerbation: Principal | ICD-10-CM | POA: Diagnosis present

## 2014-03-13 DIAGNOSIS — E8729 Other acidosis: Secondary | ICD-10-CM | POA: Diagnosis present

## 2014-03-13 DIAGNOSIS — E785 Hyperlipidemia, unspecified: Secondary | ICD-10-CM | POA: Diagnosis present

## 2014-03-13 DIAGNOSIS — E873 Alkalosis: Secondary | ICD-10-CM | POA: Diagnosis present

## 2014-03-13 LAB — CBC WITH DIFFERENTIAL/PLATELET
Basophils Absolute: 0 10*3/uL (ref 0.0–0.1)
Basophils Relative: 0 % (ref 0–1)
EOS PCT: 0 % (ref 0–5)
Eosinophils Absolute: 0 10*3/uL (ref 0.0–0.7)
HEMATOCRIT: 36.6 % — AB (ref 39.0–52.0)
HEMOGLOBIN: 12.1 g/dL — AB (ref 13.0–17.0)
Lymphocytes Relative: 7 % — ABNORMAL LOW (ref 12–46)
Lymphs Abs: 1.3 10*3/uL (ref 0.7–4.0)
MCH: 30.9 pg (ref 26.0–34.0)
MCHC: 33.1 g/dL (ref 30.0–36.0)
MCV: 93.4 fL (ref 78.0–100.0)
MONOS PCT: 7 % (ref 3–12)
Monocytes Absolute: 1.3 10*3/uL — ABNORMAL HIGH (ref 0.1–1.0)
Neutro Abs: 16.1 10*3/uL — ABNORMAL HIGH (ref 1.7–7.7)
Neutrophils Relative %: 86 % — ABNORMAL HIGH (ref 43–77)
Platelets: 275 10*3/uL (ref 150–400)
RBC: 3.92 MIL/uL — ABNORMAL LOW (ref 4.22–5.81)
RDW: 13.5 % (ref 11.5–15.5)
WBC: 18.6 10*3/uL — AB (ref 4.0–10.5)

## 2014-03-13 LAB — COMPREHENSIVE METABOLIC PANEL
ALT: 18 U/L (ref 0–53)
AST: 26 U/L (ref 0–37)
Albumin: 4.2 g/dL (ref 3.5–5.2)
Alkaline Phosphatase: 65 U/L (ref 39–117)
BILIRUBIN TOTAL: 0.3 mg/dL (ref 0.3–1.2)
BUN: 24 mg/dL — ABNORMAL HIGH (ref 6–23)
CALCIUM: 9.3 mg/dL (ref 8.4–10.5)
CHLORIDE: 96 meq/L (ref 96–112)
CO2: 23 meq/L (ref 19–32)
Creatinine, Ser: 0.92 mg/dL (ref 0.50–1.35)
GFR calc non Af Amer: 77 mL/min — ABNORMAL LOW (ref >90)
GFR, EST AFRICAN AMERICAN: 89 mL/min — AB (ref >90)
GLUCOSE: 197 mg/dL — AB (ref 70–99)
Potassium: 3.8 mEq/L (ref 3.7–5.3)
Sodium: 137 mEq/L (ref 137–147)
Total Protein: 7.7 g/dL (ref 6.0–8.3)

## 2014-03-13 LAB — PROTIME-INR
INR: 1.07 (ref 0.00–1.49)
Prothrombin Time: 13.7 seconds (ref 11.6–15.2)

## 2014-03-13 LAB — URINALYSIS, ROUTINE W REFLEX MICROSCOPIC
Bilirubin Urine: NEGATIVE
GLUCOSE, UA: 250 mg/dL — AB
Hgb urine dipstick: NEGATIVE
Ketones, ur: NEGATIVE mg/dL
LEUKOCYTES UA: NEGATIVE
NITRITE: NEGATIVE
PROTEIN: NEGATIVE mg/dL
Specific Gravity, Urine: 1.023 (ref 1.005–1.030)
Urobilinogen, UA: 0.2 mg/dL (ref 0.0–1.0)
pH: 5.5 (ref 5.0–8.0)

## 2014-03-13 LAB — I-STAT CG4 LACTIC ACID, ED: LACTIC ACID, VENOUS: 3.78 mmol/L — AB (ref 0.5–2.2)

## 2014-03-13 LAB — PRO B NATRIURETIC PEPTIDE: Pro B Natriuretic peptide (BNP): 202.8 pg/mL (ref 0–450)

## 2014-03-13 LAB — TROPONIN I

## 2014-03-13 LAB — LIPASE, BLOOD: Lipase: 28 U/L (ref 11–59)

## 2014-03-13 MED ORDER — MOMETASONE FURO-FORMOTEROL FUM 200-5 MCG/ACT IN AERO
2.0000 | INHALATION_SPRAY | Freq: Two times a day (BID) | RESPIRATORY_TRACT | Status: DC
  Administered 2014-03-13 – 2014-03-16 (×6): 2 via RESPIRATORY_TRACT
  Filled 2014-03-13: qty 8.8

## 2014-03-13 MED ORDER — IPRATROPIUM-ALBUTEROL 0.5-2.5 (3) MG/3ML IN SOLN: 3.0000 mL | Freq: Once | RESPIRATORY_TRACT | Status: DC

## 2014-03-13 MED ORDER — METHYLPREDNISOLONE SODIUM SUCC 125 MG IJ SOLR
125.0000 mg | Freq: Once | INTRAMUSCULAR | Status: AC
  Administered 2014-03-13: 125 mg via INTRAVENOUS
  Filled 2014-03-13: qty 2

## 2014-03-13 MED ORDER — IPRATROPIUM-ALBUTEROL 0.5-2.5 (3) MG/3ML IN SOLN
3.0000 mL | RESPIRATORY_TRACT | Status: AC
  Administered 2014-03-13: 3 mL via RESPIRATORY_TRACT
  Filled 2014-03-13: qty 3

## 2014-03-13 MED ORDER — SODIUM CHLORIDE 0.9 % IV BOLUS (SEPSIS): 1000.0000 mL | Freq: Once | INTRAVENOUS | Status: DC

## 2014-03-13 MED ORDER — DEXTROSE 5 % IV SOLN
500.0000 mg | Freq: Once | INTRAVENOUS | Status: AC
  Administered 2014-03-13: 500 mg via INTRAVENOUS

## 2014-03-13 MED ORDER — SODIUM CHLORIDE 0.9 % IV SOLN
Freq: Once | INTRAVENOUS | Status: AC
  Administered 2014-03-13: 13:00:00 via INTRAVENOUS

## 2014-03-13 MED ORDER — SCOPOLAMINE HBR 0.25 % OP SOLN: 1.0000 [drp] | Freq: Every day | OPHTHALMIC | Status: DC

## 2014-03-13 MED ORDER — HOMATROPINE HBR 2 % OP SOLN
1.0000 [drp] | Freq: Every day | OPHTHALMIC | Status: DC
  Filled 2014-03-13: qty 5

## 2014-03-13 MED ORDER — SODIUM CHLORIDE 0.9 % IV SOLN
INTRAVENOUS | Status: DC
  Administered 2014-03-13: 21:00:00 via INTRAVENOUS

## 2014-03-13 MED ORDER — TIMOLOL MALEATE 0.5 % OP SOLN
1.0000 [drp] | Freq: Every day | OPHTHALMIC | Status: DC
  Filled 2014-03-13: qty 5

## 2014-03-13 MED ORDER — LEVOTHYROXINE SODIUM 88 MCG PO TABS
88.0000 ug | ORAL_TABLET | Freq: Every day | ORAL | Status: DC
  Administered 2014-03-14 – 2014-03-16 (×3): 88 ug via ORAL
  Filled 2014-03-13 (×4): qty 1

## 2014-03-13 MED ORDER — HEPARIN SODIUM (PORCINE) 5000 UNIT/ML IJ SOLN
5000.0000 [IU] | Freq: Three times a day (TID) | INTRAMUSCULAR | Status: DC
  Administered 2014-03-13 – 2014-03-16 (×7): 5000 [IU] via SUBCUTANEOUS
  Filled 2014-03-13 (×12): qty 1

## 2014-03-13 MED ORDER — SIMVASTATIN 20 MG PO TABS
20.0000 mg | ORAL_TABLET | Freq: Every day | ORAL | Status: DC
  Administered 2014-03-14 – 2014-03-15 (×2): 20 mg via ORAL
  Filled 2014-03-13 (×6): qty 1

## 2014-03-13 MED ORDER — DEXTROSE 5 % IV SOLN
500.0000 mg | INTRAVENOUS | Status: DC
  Administered 2014-03-14: 500 mg via INTRAVENOUS
  Filled 2014-03-13: qty 500

## 2014-03-13 MED ORDER — METHYLPREDNISOLONE SODIUM SUCC 125 MG IJ SOLR
60.0000 mg | INTRAMUSCULAR | Status: DC
  Administered 2014-03-14: 60 mg via INTRAVENOUS
  Filled 2014-03-13: qty 2
  Filled 2014-03-13 (×2): qty 0.96

## 2014-03-13 MED ORDER — SODIUM CHLORIDE 0.9 % IJ SOLN
3.0000 mL | Freq: Two times a day (BID) | INTRAMUSCULAR | Status: DC
  Administered 2014-03-14 – 2014-03-16 (×5): 3 mL via INTRAVENOUS

## 2014-03-13 MED ORDER — IPRATROPIUM-ALBUTEROL 0.5-2.5 (3) MG/3ML IN SOLN
3.0000 mL | RESPIRATORY_TRACT | Status: DC
  Administered 2014-03-13 – 2014-03-14 (×4): 3 mL via RESPIRATORY_TRACT
  Filled 2014-03-13 (×4): qty 3

## 2014-03-13 MED ORDER — PROMETHAZINE HCL 25 MG PO TABS
12.5000 mg | ORAL_TABLET | Freq: Four times a day (QID) | ORAL | Status: DC | PRN
  Administered 2014-03-14: 12.5 mg via ORAL
  Filled 2014-03-13: qty 1

## 2014-03-13 MED ORDER — ASPIRIN EC 81 MG PO TBEC
81.0000 mg | DELAYED_RELEASE_TABLET | Freq: Every day | ORAL | Status: DC
  Administered 2014-03-14 – 2014-03-16 (×3): 81 mg via ORAL
  Filled 2014-03-13 (×3): qty 1

## 2014-03-13 NOTE — ED Notes (Signed)
Patient c/o dizziness intermittent for the past few weeks with multiple falls at home- patient denies injury from falls and LOC sts has always had something nearby to land on. Patient did not inform PCP of falls but did inform PCP of dizziness. Family sts PCP decreased dose of BP medications and scheduled ECHO for potential "clogged arteries"- pt sts dizziness persists. And endorsed and episode of dizziness accompanied with chest pain today. Pt denies CP at present time, patient sitting comfortably in bed with 2L of O2 [place] per baseline. Pt sts SOB is at baseline. Respirations equal and unlabored, chest expansion symmetrical, Radial pulses 2+, skin warm and dry. Patient in no acute distress.

## 2014-03-13 NOTE — ED Notes (Signed)
Results of lactic acid shown to Dr. Vanita Panda

## 2014-03-13 NOTE — ED Notes (Signed)
Dr.Lockwood at bedside  

## 2014-03-13 NOTE — H&P (Signed)
Date: 03/13/2014               Patient Name:  Willie Williamson MRN: [id number]  DOB: [birth year] Age / Sex: 80 y.o., male   PCP: [name], MD         Medical Service: Internal Medicine Teaching Service         Attending Physician: Dr. Madilyn Fireman, MD    First Contact: Dr. Mechele Claude Pager: [phone]  Second Contact: Dr. Eulas Post Pager: [phone]       After Hours (After 5p/  First Contact Pager: [phone]  weekends / holidays): Second Contact Pager: [phone]   Chief Complaint: SOB  History of Present Illness:  This is a 80yo WM with PMH bladder cancer, HLD, HTN, CAD, COPD (GOLD C on home oxygen, followed by Dr. Joya Gaskins) who presents w/ c/o worsening SOB since yesterday. Patient has chronic cough productive of white sputum and SOB. His cough has been unchanged, though he has had worsening SOB since yesterday morning. He was taken to his PCP yesterday and prescribed azithromycin and methylprednisolone, which he has been taking. He has been taking his albuterol inhaler 4-5 times per day over the last few days, usually takes it 2-3 times per day. Does not have a nebulizer machine at home. He had an episode of central, nonradiating, dull chest pain this morning that lasted a couple of minutes, then resolved.   Patient also complains of having multiple episodes of near syncope when he stands up over the last 3 weeks. Patient was seen by PCP on 4/21 for this issue and told to drink more water.An outpatient echo was scheduled for next week. He does endorse drinking multiple cups of coffee today and not drinking many other types of fluid. Appetite normal and eating well. Patient has "almost" fallen, but has been able to catch himself prior to falling each time. Denies complete LOC, just says he feels lightheaded. No vision changes, focal neurologic symptoms. Denies dysuria, increased urinary frequency, N/V/D, palpitations, other episodes of chest pain, BLE edema, PND. He does have 2 pillow orthopnea,  this has been stable for a long time.   Meds: Current Facility-Administered Medications  Medication Dose Route Frequency Provider Last Rate Last Dose  . azithromycin (ZITHROMAX) 500 mg in dextrose 5 % 250 mL IVPB  500 mg Intravenous Once Carmin Muskrat, MD 250 mL/hr at 03/13/14 1507 500 mg at 03/13/14 1507   Current Outpatient Prescriptions  Medication Sig Dispense Refill  . albuterol (PROVENTIL HFA;VENTOLIN HFA) 108 (90 BASE) MCG/ACT inhaler Inhale 2 puffs into the lungs every 4 (four) hours as needed.  1 Inhaler  6  . amLODipine (NORVASC) 5 MG tablet TAKE 1 TABLET EVERY DAY  30 tablet  6  . aspirin 81 MG tablet Take 81 mg by mouth daily.        Marland Kitchen azithromycin (ZITHROMAX) 500 MG tablet Take 500 mg by mouth daily. For 3 days. Started 03/12/14      . carvedilol (COREG) 6.25 MG tablet Take 6.25 mg by mouth 2 (two) times daily with a meal.      . fluticasone (FLONASE) 50 MCG/ACT nasal spray Place 2 sprays into both nostrils daily.  16 g  2  . Fluticasone-Salmeterol (ADVAIR DISKUS) 500-50 MCG/DOSE AEPB INHALE 1 PUFF TWICE A DAY  60 each  11  . GuaiFENesin (MUCINEX PO) Take 400 mg by mouth 3 (three) times daily as needed (for congestion).       Marland Kitchen levothyroxine (SYNTHROID, LEVOTHROID) 88  MCG tablet Take 88 mcg by mouth daily before breakfast.      . loratadine (CLARITIN) 10 MG tablet Take 10 mg by mouth daily as needed for allergies.      Marland Kitchen lovastatin (MEVACOR) 20 MG tablet Take 20 mg by mouth at bedtime.        . methylPREDNISolone (MEDROL DOSEPAK) 4 MG tablet Take 4-24 mg by mouth daily. For 6 days. Started 03/12/14      . predniSONE (DELTASONE) 10 MG tablet Take 10 mg by mouth daily with breakfast. Take 4 tablets daily for 5 days then stop. Started 03/10/14      . scopolamine (HYOSCINE) 0.25 % ophthalmic solution Place 1 drop into the left eye as needed.       . timolol (BETIMOL) 0.5 % ophthalmic solution Place 1 drop into the left eye as needed.        Allergies: Allergies as of 03/13/2014 -  Review Complete 03/13/2014  Allergen Reaction Noted  . Penicillins     Past Medical History  Diagnosis Date  . Unspecified essential hypertension   . Shortness of breath   . Acute bronchitis   . Other acute sinusitis   . Malignant neoplasm of bladder, part unspecified   . Other and unspecified hyperlipidemia   . Coronary atherosclerosis of unspecified type of vessel, native or graft   . Other emphysema   . Chronic airway obstruction, not elsewhere classified   . Hypothyroidism    Past Surgical History  Procedure Laterality Date  . Turp vaporization    . Bladder surgery     History reviewed. No pertinent family history. History   Social History  . Marital Status: Married    Spouse Name: N/A    Number of Children: N/A  . Years of Education: N/A   Occupational History  . retired    Social History Main Topics  . Smoking status: Former Smoker -- 1.50 packs/day for 48 years    Types: Cigarettes    Quit date: 11/20/1996  . Smokeless tobacco: Never Used  . Alcohol Use: Not on file  . Drug Use: Not on file  . Sexual Activity: Not on file   Other Topics Concern  . Not on file   Social History Narrative  . No narrative on file    Review of Systems: A comprehensive 10 point ROS was performed. Other than what was stated in HPI all other systems negative.   Physical Exam: Blood pressure 125/64, pulse 113, temperature 98.5 F (36.9 C), temperature source Oral, resp. rate 18, height 5\' 7"  (1.702 m), weight 155 lb (70.308 kg), SpO2 96.00%. 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), vision in R eye grossly intact, oropharynx clear and non-erythematous, MMM Neck: supple, no LAD Lungs: increased WOB, using accessory muscles, poor air movement throughout bilateral lung fields, some scattered expiratory wheezing Heart:  Tachycardic, regular rhythm, no murmurs, gallops, or rubs Abdomen: soft, non-tender, non-distended, normal bowel sounds Extremities: warm and well perfused, trace pitting edema to BLE Neurologic: alert & oriented X3, cranial nerves II-XII grossly intact, strength grossly intact, sensation intact to light touch  Lab results: Basic Metabolic Panel:  Recent Labs  03/13/14 1211  NA 137  K 3.8  CL 96  CO2 23  GLUCOSE 197*  BUN 24*  CREATININE 0.92  CALCIUM 9.3   Anion gap: 18  Liver Function Tests:  Recent Labs  03/13/14 1211  AST 26  ALT 18  ALKPHOS 65  BILITOT 0.3  PROT 7.7  ALBUMIN 4.2    Recent Labs  03/13/14 1211  LIPASE 28   CBC:  Recent Labs  03/13/14 1211  WBC 18.6*  NEUTROABS 16.1*  HGB 12.1*  HCT 36.6*  MCV 93.4  PLT 275   Cardiac Enzymes:  Recent Labs  03/13/14 1211  TROPONINI <0.30   BNP:  Recent Labs  03/13/14 1211  PROBNP 202.8   Coagulation:  Recent Labs  03/13/14 1211  LABPROT 13.7  INR 1.07   Urinalysis:  Recent Labs  03/13/14 1310  COLORURINE YELLOW  LABSPEC 1.023  PHURINE 5.5  GLUCOSEU 250*  HGBUR NEGATIVE  BILIRUBINUR NEGATIVE  KETONESUR NEGATIVE  PROTEINUR NEGATIVE  UROBILINOGEN 0.2  NITRITE NEGATIVE  LEUKOCYTESUR NEGATIVE   Misc. Labs: Lactic acid 3.78  Imaging results:  Dg Chest 2 View  03/13/2014   CLINICAL DATA:  One day history of chest pain and shortness of breath  EXAM: CHEST  2 VIEW  COMPARISON:  Prior chest x-ray 09/11/2012  FINDINGS: Cardiac and mediastinal contours within normal limits. Aortic atherosclerosis. The lungs are slightly hyperinflated. Mild such bronchitic changes and interstitial prominence similar compared to prior. Negative for pneumothorax, pleural effusion or overt pulmonary edema. No suspicious pulmonary nodule or mass. Visualized upper abdominal bowel gas pattern is unremarkable. No acute osseous abnormality.  IMPRESSION: 1. Stable chest x-ray without evidence of active  disease. 2. Chronic changes of COPD.   Electronically Signed   By: [name]   On: 03/13/2014 12:41    Other results: EKG: poor quality. Will repeat.  Assessment & Plan by Problem:  # COPD exacerbation: Patient with worsening SOB and continued productive cough that most likely represents mild COPD exacerbation. Patient is afebrile and satting well on 2LPM supplemental O2, which is what he is on at home. Leukocytosis present, WBC count 18.6 (though has been receiving steroids throughout the week). CXR shows chronic changes of COPD, though no infiltrate or other cardiopulmonary process. ProBNP 202. Trop neg x 1. PE is a consideration given his tachycardia and hx of bladder cancer, though no other s/s of PE. He does have a modified geneva score of 8 (moderate risk of PE). COPD exacerbation is more likely given his wheezing and hx of COPD exacerbations approx monthly. Pt is s/p duoneb, solumedrol 125mg  IV and azithromycin 500mg  IV in ED.  -admit to SDU  -duoneb q4h scheduled  -solumedrol 60mg  IV daily -azithromycin 500mg  IV daily -repeat EKG given poor quality of initial EKG -Pt on Advair at home, will give dulera per formulary  -BMP/CBC in AM -BCx x 2 pending  # Presyncope: Patient presents with 3 week hx of multiple episodes of near syncope when he stands up. Patient also endorses hx of drinking a lot of caffeine without much water at all. I suspect that patient is hypovolemic and perhaps orthostatic. Given difficulty breathing, will not check orthostatics at this time.  S/p 1L NS in ED. Will give more fluids overnight and assess in the morning. Patient had been evaluated by PCP earlier this week and had outpatient Echo ordered for next week. Given patient's age and symptoms of near syncope I think it is prudent to go ahead and check an Echo while patient is in the hospital to assess for cardiac structural abnormalities such as AS that could explain his symptoms. No prior Echo in our  system. -orthostatic  vital signs when breathing has improved. -IVF: 1L NS over 3 hrs then NS at 100cc/hr -cycle troponins -2D Echo  -continue home zocor, ASA 81mg  daily  # Elevated AG metabolic acidosis: Patient with AG 18 and lactic acid of 3.78. Unclear etiology, though patient may be volume depleted with orthostatic hypotension. Patient also working to breath which can elevate lactic acid. He did receive 1L NS in ED. Will continue fluid resuscitation overnight. No abd pain to indicate bowel ischemia. Patient is not septic appearing. No hx of seizure-like activity.  -recheck lactic acid and BMP in AM  # Hypothyroidism: Patient on synthroid 42mcg daily at home.  -continue home regimen   # HTN: Patient with normal-soft blood pressure on admission (111-120/50-60s). He is on coreg 6.25mg  BID (?decreased by PCP to daily) and amlodipine 5mg  daily at home.  -holding home antihypertensives given ?orthostatic hypotension   # VTE: heparin   # Diet: NPO incase decompensates  Code status: full   Dispo: Disposition is deferred at this time, awaiting improvement of current medical problems. Anticipated discharge in approximately 1-2 day(s).   The patient does have a current PCP [name], MD) and does not need an [facility] hospital follow-up appointment after discharge.  The patient does not have transportation limitations that hinder transportation to clinic appointments.  Signed: Rebecca Eaton, MD 03/13/2014, 3:57 PM

## 2014-03-13 NOTE — ED Notes (Signed)
Pt reports that he has been to his PCP this week for dizziness and is scheduled for and echocardiogram this week. States that he has just not been feeling well and is still feeling dizzy. Pt wears home O2.

## 2014-03-13 NOTE — Progress Notes (Signed)
Patient clear, sats 98% on 2 Lpm nasal cannula. Treatment time too close, will begin at scheduled treatment time of 2000.

## 2014-03-13 NOTE — ED Provider Notes (Signed)
CSN: [id number]     Arrival date & time 03/13/14  1200 History   First MD Initiated Contact with Patient 03/13/14 1209     Chief Complaint  Patient presents with  . Chest Pain  . Dizziness     (Consider location/radiation/quality/duration/timing/severity/associated sxs/prior Treatment) HPI Patient presents with concerns of ongoing dizziness, dyspnea. Symptoms began several weeks ago, had, near incapacitating. There is no focal pain, though there is nausea. Patient has multiple medical problems, is dependent on oxygen 24/7. No syncope, falls, vomiting. Patient hasn't seen his physician, is currently scheduled for echocardiogram as an outpatient next week.  Past Medical History  Diagnosis Date  . Unspecified essential hypertension   . Shortness of breath   . Acute bronchitis   . Other acute sinusitis   . Malignant neoplasm of bladder, part unspecified   . Other and unspecified hyperlipidemia   . Coronary atherosclerosis of unspecified type of vessel, native or graft   . Other emphysema   . Chronic airway obstruction, not elsewhere classified   . Hypothyroidism    Past Surgical History  Procedure Laterality Date  . Turp vaporization    . Bladder surgery     History reviewed. No pertinent family history. History  Substance Use Topics  . Smoking status: Former Smoker -- 1.50 packs/day for 48 years    Types: Cigarettes    Quit date: 11/20/1996  . Smokeless tobacco: Never Used  . Alcohol Use: Not on file    Review of Systems  Constitutional:       Per HPI, otherwise negative  HENT:       Per HPI, otherwise negative  Respiratory:       Per HPI, otherwise negative  Cardiovascular:       Per HPI, otherwise negative  Gastrointestinal: Negative for vomiting.  Endocrine:       Negative aside from HPI  Genitourinary:       Neg aside from HPI   Musculoskeletal:       Per HPI, otherwise negative  Skin: Negative.   Neurological: Negative for syncope.      Allergies   Penicillins  Home Medications   Prior to Admission medications   Medication Sig Start Date End Date Taking? Authorizing Provider  albuterol (PROVENTIL HFA;VENTOLIN HFA) 108 (90 BASE) MCG/ACT inhaler Inhale 2 puffs into the lungs every 4 (four) hours as needed. 12/11/13  Yes Elsie Stain, MD  amLODipine (NORVASC) 5 MG tablet TAKE 1 TABLET EVERY DAY 08/17/13  Yes [name], MD  aspirin 81 MG tablet Take 81 mg by mouth daily.     Yes Historical Provider, MD  carvedilol (COREG) 6.25 MG tablet Take 6.25 mg by mouth 2 (two) times daily with a meal. 07/26/13  Yes [name], MD  fluticasone (FLONASE) 50 MCG/ACT nasal spray Place 2 sprays into both nostrils daily. 12/11/13  Yes Elsie Stain, MD  Fluticasone-Salmeterol (ADVAIR DISKUS) 500-50 MCG/DOSE AEPB INHALE 1 PUFF TWICE A DAY 12/11/13  Yes Elsie Stain, MD  GuaiFENesin (MUCINEX PO) Take 400 mg by mouth 3 (three) times daily as needed (for congestion).    Yes Historical Provider, MD  levothyroxine (SYNTHROID, LEVOTHROID) 88 MCG tablet Take 88 mcg by mouth daily before breakfast.   Yes Historical Provider, MD  loratadine (CLARITIN) 10 MG tablet Take 10 mg by mouth daily as needed for allergies.   Yes Historical Provider, MD  lovastatin (MEVACOR) 20 MG tablet Take 20 mg by mouth at bedtime.  Yes Historical Provider, MD  predniSONE (DELTASONE) 10 MG tablet Take 10 mg by mouth daily with breakfast. Take 4 tablets daily for 5 days then stop. Started 03/10/14 12/11/13  Yes Elsie Stain, MD  scopolamine (HYOSCINE) 0.25 % ophthalmic solution Place 1 drop into the left eye as needed.    Yes Historical Provider, MD  timolol (BETIMOL) 0.5 % ophthalmic solution Place 1 drop into the left eye as needed.    Historical Provider, MD   BP 127/76  Pulse 90  Temp(Src) 98.5 F (36.9 C) (Oral)  Resp 18  Ht 5\' 7"  (1.702 m)  Wt 155 lb (70.308 kg)  BMI 24.27 kg/m2  SpO2 96% Physical Exam  Nursing note and vitals  reviewed. Constitutional: He is oriented to person, place, and time. He appears well-developed. He appears ill. No distress.  HENT:  Head: Normocephalic and atraumatic.  Eyes: Conjunctivae and EOM are normal.  Cardiovascular: Normal rate and regular rhythm.   Pulmonary/Chest: Effort normal. No stridor. No respiratory distress.  Nasal cannula oxygen in place  Abdominal: He exhibits no distension.  Musculoskeletal: He exhibits no edema.  Neurological: He is alert and oriented to person, place, and time. No cranial nerve deficit. He exhibits normal muscle tone. Coordination normal.  Skin: Skin is warm and dry.  Psychiatric: He has a normal mood and affect.    ED Course  Procedures (including critical care time) Labs Review Labs Reviewed  CBC WITH DIFFERENTIAL - Abnormal; Notable for the following:    WBC 18.6 (*)    RBC 3.92 (*)    Hemoglobin 12.1 (*)    HCT 36.6 (*)    Neutrophils Relative % 86 (*)    Neutro Abs 16.1 (*)    Lymphocytes Relative 7 (*)    Monocytes Absolute 1.3 (*)    All other components within normal limits  I-STAT CG4 LACTIC ACID, ED - Abnormal; Notable for the following:    Lactic Acid, Venous 3.78 (*)    All other components within normal limits  PROTIME-INR  COMPREHENSIVE METABOLIC PANEL  LIPASE, BLOOD  TROPONIN I  URINALYSIS, ROUTINE W REFLEX MICROSCOPIC  PRO B NATRIURETIC PEPTIDE    Imaging Review Dg Chest 2 View  03/13/2014   CLINICAL DATA:  One day history of chest pain and shortness of breath  EXAM: CHEST  2 VIEW  COMPARISON:  Prior chest x-ray 09/11/2012  FINDINGS: Cardiac and mediastinal contours within normal limits. Aortic atherosclerosis. The lungs are slightly hyperinflated. Mild such bronchitic changes and interstitial prominence similar compared to prior. Negative for pneumothorax, pleural effusion or overt pulmonary edema. No suspicious pulmonary nodule or mass. Visualized upper abdominal bowel gas pattern is unremarkable. No acute osseous  abnormality.  IMPRESSION: 1. Stable chest x-ray without evidence of active disease. 2. Chronic changes of COPD.   Electronically Signed   By: [name]   On: 03/13/2014 12:41     ECG: tachycardia - regular, unclear due to artefact abnormal  1:40 PM On exam the patient appears comfortable.  He continues to deny chest pain.  Initial labs demonstrate leukocytosis, lactic acidosis. IV fluids running.  2:09 PM The remainder of the patient's labs are largely reassuring, though there is hyperglycemia, there is no elevated troponin, and his electrolytes are unremarkable. MDM  Patient presents with ongoing dizziness.  On exam patient is awake, alert, neurologically intact, but the patient is found to be tachycardic, with leukocytosis, lactic acidosis on the initial evaluation. Patient received fluid resuscitation.  X-ray does  not demonstrate pneumonia.  However, given the patient's history of COPD, his oxygen use at home, he received antibiotics, in addition to steroids, was admitted for further evaluation and management.   Carmin Muskrat, MD 03/13/14 [phone]

## 2014-03-13 NOTE — ED Notes (Signed)
Attempted report 

## 2014-03-14 DIAGNOSIS — I10 Essential (primary) hypertension: Secondary | ICD-10-CM

## 2014-03-14 DIAGNOSIS — E039 Hypothyroidism, unspecified: Secondary | ICD-10-CM

## 2014-03-14 DIAGNOSIS — E872 Acidosis, unspecified: Secondary | ICD-10-CM

## 2014-03-14 DIAGNOSIS — R55 Syncope and collapse: Secondary | ICD-10-CM

## 2014-03-14 DIAGNOSIS — J441 Chronic obstructive pulmonary disease with (acute) exacerbation: Principal | ICD-10-CM

## 2014-03-14 LAB — CBC
HEMATOCRIT: 32.3 % — AB (ref 39.0–52.0)
Hemoglobin: 10.6 g/dL — ABNORMAL LOW (ref 13.0–17.0)
MCH: 30.8 pg (ref 26.0–34.0)
MCHC: 32.8 g/dL (ref 30.0–36.0)
MCV: 93.9 fL (ref 78.0–100.0)
Platelets: 220 10*3/uL (ref 150–400)
RBC: 3.44 MIL/uL — ABNORMAL LOW (ref 4.22–5.81)
RDW: 14 % (ref 11.5–15.5)
WBC: 13.8 10*3/uL — ABNORMAL HIGH (ref 4.0–10.5)

## 2014-03-14 LAB — BASIC METABOLIC PANEL
BUN: 16 mg/dL (ref 6–23)
CO2: 23 mEq/L (ref 19–32)
Calcium: 8.4 mg/dL (ref 8.4–10.5)
Chloride: 108 mEq/L (ref 96–112)
Creatinine, Ser: 0.75 mg/dL (ref 0.50–1.35)
GFR calc Af Amer: >90 mL/min (ref >90)
GFR, EST NON AFRICAN AMERICAN: 84 mL/min — AB (ref >90)
Glucose, Bld: 176 mg/dL — ABNORMAL HIGH (ref 70–99)
Potassium: 3.9 mEq/L (ref 3.7–5.3)
Sodium: 143 mEq/L (ref 137–147)

## 2014-03-14 LAB — LACTIC ACID, PLASMA: Lactic Acid, Venous: 2.4 mmol/L — ABNORMAL HIGH (ref 0.5–2.2)

## 2014-03-14 LAB — TROPONIN I: Troponin I: <0.3 ng/mL (ref <0.30)

## 2014-03-14 MED ORDER — ALBUTEROL SULFATE (2.5 MG/3ML) 0.083% IN NEBU: 2.5000 mg | INHALATION_SOLUTION | RESPIRATORY_TRACT | Status: DC | PRN

## 2014-03-14 MED ORDER — IPRATROPIUM-ALBUTEROL 0.5-2.5 (3) MG/3ML IN SOLN
3.0000 mL | Freq: Three times a day (TID) | RESPIRATORY_TRACT | Status: DC
  Administered 2014-03-14 (×2): 3 mL via RESPIRATORY_TRACT
  Filled 2014-03-14 (×3): qty 3

## 2014-03-14 MED ORDER — AZITHROMYCIN 500 MG PO TABS
500.0000 mg | ORAL_TABLET | Freq: Every day | ORAL | Status: DC
  Administered 2014-03-15 – 2014-03-16 (×2): 500 mg via ORAL
  Filled 2014-03-14 (×2): qty 1

## 2014-03-14 MED ORDER — PREDNISONE 20 MG PO TABS
40.0000 mg | ORAL_TABLET | Freq: Every day | ORAL | Status: DC
  Administered 2014-03-15 – 2014-03-16 (×2): 40 mg via ORAL
  Filled 2014-03-14 (×4): qty 2

## 2014-03-14 NOTE — Discharge Summary (Signed)
Name: Willie Williamson MRN: [id number] DOB: 20-Jun-1931 80 y.o. PCP: [name], MD  Date of Admission: 03/13/2014 12:06 PM Date of Discharge: 03/16/2014 Attending Physician: Dr. Ellwood Dense  Discharge Diagnosis: Principal Problem:   COPD exacerbation Active Problems:   C O P D [place] C   Metabolic acidosis, increased anion gap   Orthostatic hypotension   Discharge Medications:   Medication List    STOP taking these medications       amLODipine 5 MG tablet  Commonly known as:  NORVASC     methylPREDNISolone 4 MG tablet  Commonly known as:  MEDROL DOSEPAK      TAKE these medications       albuterol 108 (90 BASE) MCG/ACT inhaler  Commonly known as:  PROVENTIL HFA;VENTOLIN HFA  Inhale 2 puffs into the lungs every 4 (four) hours as needed.     aspirin 81 MG tablet  Take 81 mg by mouth daily.     azithromycin 500 MG tablet  Commonly known as:  ZITHROMAX  Take 1 tablet (500 mg total) by mouth daily.     carvedilol 3.125 MG tablet  Commonly known as:  COREG  Take 1 tablet (3.125 mg total) by mouth 2 (two) times daily with a meal.     fluticasone 50 MCG/ACT nasal spray  Commonly known as:  FLONASE  Place 2 sprays into both nostrils daily.     Fluticasone-Salmeterol 500-50 MCG/DOSE Aepb  Commonly known as:  ADVAIR DISKUS  INHALE 1 PUFF TWICE A DAY     levothyroxine 88 MCG tablet  Commonly known as:  SYNTHROID, LEVOTHROID  Take 88 mcg by mouth daily before breakfast.     loratadine 10 MG tablet  Commonly known as:  CLARITIN  Take 10 mg by mouth daily as needed for allergies.     lovastatin 20 MG tablet  Commonly known as:  MEVACOR  Take 20 mg by mouth at bedtime.     MUCINEX PO  Take 400 mg by mouth 3 (three) times daily as needed (for congestion).     predniSONE 20 MG tablet  Commonly known as:  DELTASONE  Take 2 tablets (40 mg total) by mouth daily with breakfast.     scopolamine 0.25 % ophthalmic solution  Commonly known as:  HYOSCINE  Place 1 drop  into the left eye as needed.     timolol 0.5 % ophthalmic solution  Commonly known as:  BETIMOL  Place 1 drop into the left eye as needed.       Disposition and follow-up:   WillieEmmanuelle CARLE Williamson was discharged from [facility] in Stable condition.  At the hospital follow up visit please address:  1. COPD- assess breathing 2. Orthostatic hypotension- pt discharged on reduced dose of coreg (coreg 3.125mg  BID) and his home amlodipine was discontinued; please monitor blood pressure and assess for whether his antihypertensives need readjusting; also please re-emphasize the importance of good hydration and drinking less coffee as this was likely 2/2 hypovolemia   2.  Labs / imaging needed at time of follow-up: none  3.  Pending labs/ test needing follow-up: BCx (NGTD x 4 days)  Follow-up Appointments: Follow-up Information   Follow up with [name], MD On 03/23/2014. (10:15am)    Contact information:   [address] [place] [phone]       Discharge Instructions: Discharge Orders   Future Appointments Provider Department Dept Phone   03/25/2014 9:00 AM [name],  MD [place] [phone]   Future Orders Complete By Expires   Call MD for:  difficulty breathing, headache or visual disturbances  As directed    Call MD for:  extreme fatigue  As directed    Call MD for:  persistant dizziness or light-headedness  As directed    Diet - low sodium heart healthy  As directed    Increase activity slowly  As directed       Consultations:  none  Procedures Performed:  Dg Chest 2 View  03/13/2014   CLINICAL DATA:  One day history of chest pain and shortness of breath  EXAM: CHEST  2 VIEW  COMPARISON:  Prior chest x-ray 09/11/2012  FINDINGS: Cardiac and mediastinal contours within normal limits. Aortic atherosclerosis. The lungs are slightly hyperinflated. Mild such bronchitic changes and interstitial prominence  similar compared to prior. Negative for pneumothorax, pleural effusion or overt pulmonary edema. No suspicious pulmonary nodule or mass. Visualized upper abdominal bowel gas pattern is unremarkable. No acute osseous abnormality.  IMPRESSION: 1. Stable chest x-ray without evidence of active disease. 2. Chronic changes of COPD.   Electronically Signed   By: [name]   On: 03/13/2014 12:41   Echo:  Study Conclusions  - Left ventricle: The cavity size was normal. Systolic function was normal. The estimated ejection fraction was in the range of 60% to 65%. Wall motion was normal; there were no regional wall motion abnormalities. Doppler parameters are consistent with abnormal left ventricular relaxation (grade 1 diastolic dysfunction). - Aortic valve: Trivial regurgitation.  Admission HPI:  This is a 80yo WM with PMH bladder cancer, HLD, HTN, CAD, COPD (GOLD C on home oxygen, followed by Dr. Joya Gaskins) who presents w/ c/o worsening SOB since yesterday. Patient has chronic cough productive of white sputum and SOB. His cough has been unchanged, though he has had worsening SOB since yesterday morning. He was taken to his PCP yesterday and prescribed azithromycin and methylprednisolone, which he has been taking. He has been taking his albuterol inhaler 4-5 times per day over the last few days, usually takes it 2-3 times per day. Does not have a nebulizer machine at home. He had an episode of central, nonradiating, dull chest pain this morning that lasted a couple of minutes, then resolved.  Patient also complains of having multiple episodes of near syncope when he stands up over the last 3 weeks. Patient was seen by PCP on 4/21 for this issue and told to drink more water.An outpatient echo was scheduled for next week. He does endorse drinking multiple cups of coffee today and not drinking many other types of fluid. Appetite normal and eating well. Patient has "almost" fallen, but has been able to  catch himself prior to falling each time. Denies complete LOC, just says he feels lightheaded. No vision changes, focal neurologic symptoms. Denies dysuria, increased urinary frequency, N/V/D, palpitations, other episodes of chest pain, BLE edema, PND. He does have 2 pillow orthopnea, this has been stable for a long time.   Hospital Course by problem list:   # COPD exacerbation: Patient with worsening SOB and continued productive cough that most likely represents mild COPD exacerbation. On admission, pt afebrile and satting well on 2LPM supplemental O2, which is what he is on at home. Leukocytosis present, WBC count 18.6 (though has been receiving steroids throughout the week prior to admission). CXR showed chronic changes of COPD, though no infiltrate or other cardiopulmonary process. ProBNP 202. Trop  neg x 3. Pt's breathing responded well to duonebs q4h, solumedrol IV-->prednisone 40mg  PO daily and azithromycin 500mg  daily. He was monitored overnight in SDU and then transferred to telemetry bed the next morning since he was markedly improved. Pt on Advair at home, gave dulera per formulary while hospitalized. Over his hospitalization, he remained afebrile, his WBC count trended down from 18 to 12.1. His breathing was at baseline prior to discharge and he was still requiring his home oxygen dose (2LPM per [place]). He was discharged home with prescriptions for prednisone 40mg  daily and azithromycin 500mg  daily for one more day each (total of 5 days). He was evaluated by PT and they recommended HH PT and rolling walker, both of which was set up prior to discharge.  # Presyncope with orthostatic hypotension: Patient presents with 3 week hx of multiple episodes of near syncope when he stands up. Patient also endorses hx of drinking a lot of caffeine without much water at all. I suspect the origin of his symptoms was hypovolemia as he responded well to IVF (received total of 3L NS). On admission, patient was  orthostatic per HR, though was not orthostatic after receiving IVF. Home antihypertensives held. ACS ruled out with three negative troponins. Echo showed EF 06-23%, grade 1 diastolic dysfunction, no significant cardiac structural abnormality. PT worked with patient and he was able to ambulate well without any symptoms for 2 days prior to his discharge. His home coreg was started at low dose and home amlodipine remained discontinued at discharge. PT given HH PT and rolling walker at discharge.  # Elevated AG metabolic acidosis: Patient with AG 18 and lactic acid of 3.78 on admission with down trended and then completely resolved prior to his discharge. Unclear etiology, though volume depletion and orthostatic hypotension may have played a role. More likely etiology is his increased work of breathing on his admission, which can elevate lactic acid.   # Hypothyroidism: Patient on synthroid 41mcg daily at home. Continued home regimen.  # HTN: Patient with normal-soft blood pressure on admission (111-120/50-60s). He is on coreg 6.25mg  BID and amlodipine 5mg  daily at home. Initially held all home antihypertensives given orthostatic hypotension and presumed volume depletion. After IVF, patient's blood pressure stabilized so his home coreg was restarted at half his home dose (3.125mg  BID). This was continued at discharge. His home amlodipine was not restarted at discharge. He will need close PCP follow up to assess the status of his HTN and how to further adjust his antihypertensive regimen.  Discharge Vitals:   BP 152/89  Pulse 89  Temp(Src) 98.1 F (36.7 C) (Oral)  Resp 18  Ht 5\' 7"  (1.702 m)  Wt 149 lb 6.4 oz (67.767 kg)  BMI 23.39 kg/m2  SpO2 95%  Discharge Labs:  No results found for this or any previous visit (from the past 24 hour(s)).  Signed: Rebecca Eaton, MD 03/17/2014, 7:53 AM   Time Spent on Discharge: 35 minutes Services Ordered on Discharge: [facility] PT Equipment Ordered on Discharge:  rolling walker

## 2014-03-14 NOTE — Progress Notes (Signed)
Subjective: Patient feels well this morning. He does not feel SOB and notes his cough is still at baseline. Denies any dizziness this morning while moving from the bed to bedside chair, but he has not been up and walking around yet. His only complaint this AM is that he thinks the breathing treatments make him feel jittery. Patient with PVR 400cc on bladder scan yesterday. Pt denies difficulty starting his stream of urine, dysuria, increased urinary frequency, hematuria, feeling of incomplete voiding.   Objective: Vital signs in last 24 hours: Filed Vitals:   03/13/14 2056 03/13/14 2059 03/13/14 2315 03/13/14 2320  BP: 138/77 130/75 109/64 109/64  Pulse: 141 148 102   Temp:   97.6 F (36.4 C)   TempSrc:   Oral   Resp:   21   Height:      Weight:      SpO2:   97% 98%   Weight change:   Intake/Output Summary (Last 24 hours) at 03/14/14 0747 Last data filed at 03/14/14 0500  Gross per 24 hour  Intake      0 ml  Output   1400 ml  Net  -1400 ml   Physical Exam General: alert, cooperative, NAD, sitting upright in bedside chair HEENT: [place]/AT, vision in R eye grossly intact, oropharynx clear and non-erythematous, MMM Neck: supple, no LAD Lungs: normal WOB, lungs CTAB though somewhat diminished air movement throughout bilateral lung fields; no wheezing, rhonchi, rales Heart: Tachycardic (HR low 100s), regular rhythm, no murmurs, gallops, or rubs Abdomen: soft, non-tender, non-distended, normal bowel sounds  Extremities: warm and well perfused, trace pitting edema to BLE Neurologic: alert & oriented X3, cranial nerves II-XII grossly intact  Lab Results: Basic Metabolic Panel:  Recent Labs Lab 03/13/14 1211 03/14/14 0049  NA 137 143  K 3.8 3.9  CL 96 108  CO2 23 23  GLUCOSE 197* 176*  BUN 24* 16  CREATININE 0.92 0.75  CALCIUM 9.3 8.4   Liver Function Tests:  Recent Labs Lab 03/13/14 1211  AST 26  ALT 18  ALKPHOS 65  BILITOT 0.3  PROT 7.7  ALBUMIN 4.2    Recent  Labs Lab 03/13/14 1211  LIPASE 28   CBC:  Recent Labs Lab 03/13/14 1211 03/14/14 0049  WBC 18.6* 13.8*  NEUTROABS 16.1*  --   HGB 12.1* 10.6*  HCT 36.6* 32.3*  MCV 93.4 93.9  PLT 275 220   Cardiac Enzymes:  Recent Labs Lab 03/13/14 1211 03/13/14 1900 03/14/14 0049  TROPONINI <0.30 <0.30 <0.30   BNP:  Recent Labs Lab 03/13/14 1211  PROBNP 202.8   Coagulation:  Recent Labs Lab 03/13/14 1211  LABPROT 13.7  INR 1.07   Urinalysis:  Recent Labs Lab 03/13/14 1310  COLORURINE YELLOW  LABSPEC 1.023  PHURINE 5.5  GLUCOSEU 250*  HGBUR NEGATIVE  BILIRUBINUR NEGATIVE  KETONESUR NEGATIVE  PROTEINUR NEGATIVE  UROBILINOGEN 0.2  NITRITE NEGATIVE  LEUKOCYTESUR NEGATIVE   Misc. Labs:   Micro Results: No results found for this or any previous visit (from the past 240 hour(s)).  Studies/Results: Dg Chest 2 View  03/13/2014   CLINICAL DATA:  One day history of chest pain and shortness of breath  EXAM: CHEST  2 VIEW  COMPARISON:  Prior chest x-ray 09/11/2012  FINDINGS: Cardiac and mediastinal contours within normal limits. Aortic atherosclerosis. The lungs are slightly hyperinflated. Mild such bronchitic changes and interstitial prominence similar compared to prior. Negative for pneumothorax, pleural effusion or overt pulmonary edema. No suspicious pulmonary nodule or  mass. Visualized upper abdominal bowel gas pattern is unremarkable. No acute osseous abnormality.  IMPRESSION: 1. Stable chest x-ray without evidence of active disease. 2. Chronic changes of COPD.   Electronically Signed   By: [name]   On: 03/13/2014 12:41   Medications: I have reviewed the patient's current medications. Scheduled Meds: . aspirin EC  81 mg Oral Daily  . azithromycin  500 mg Intravenous Q24H  . heparin  5,000 Units Subcutaneous 3 times per day  . ipratropium-albuterol  3 mL Nebulization Q4H  . levothyroxine  88 mcg Oral QAC breakfast  . methylPREDNISolone (SOLU-MEDROL)  injection  60 mg Intravenous Q24H  . mometasone-formoterol  2 puff Inhalation BID  . simvastatin  20 mg Oral q1800  . sodium chloride  1,000 mL Intravenous Once  . sodium chloride  3 mL Intravenous Q12H  . timolol  1 drop Left Eye Daily   Continuous Infusions: . sodium chloride 100 mL/hr at 03/13/14 2100   PRN Meds:.promethazine Assessment/Plan:  # COPD exacerbation: Normal work of breathing today and pt denies subjective shortness of breath, chest pain, wheezing, worse cough than his baseline. He appears much improved compared to yesterday. Pt slightly tachycardic, though I suspect this is 2/2 to albuterol tx as patient also feels jittery. Satting well on 2LPM (home oxygen dose). Remains afebrile. Leukocytosis down trending, WBC 18-->13.8 (?steroid effect). Troponin neg x 3 so no concern for ACS. -duonebs q4 hr scheduled -solumedrol 60mg  IV daily --> prednisone 40mg  PO daily starting tomorrow -azithromycin 500mg  IV daily --> azithromycin 500mg  PO daily starting tomorrow -Pt on Advair at home, will give dulera per formulary  -BCx x 2 NGTD  # Presyncope: Patient with resolution of dizziness this morning, but has not been up walking around yet. Orthostatics positive per HR last night and repeat orthostatics this morning (after approx 3L NS) negative. Orthostasis and lightheadedness most likely due to hypovolemia. ACS ruled out with three negative troponins. Echo today showed EF 51-76%, grade 1 diastolic dysfunction, no significant cardiac structural abnormality. -PT eval -d/c IVF  -continue home zocor, ASA 81mg  daily   # Elevated AG metabolic acidosis: Resolved. Lactate down trending (3.78-->2.4). AG 12 (down from 18 on admission).  # Hypothyroidism: Patient on synthroid 37mcg daily at home.  -continue home regimen   # HTN: Patient with normal-soft blood pressure on admission (111-120/50-60s). BP improved today, 130s/70s. He is on coreg 6.25mg  BID (?decreased by PCP to daily) and  amlodipine 5mg  daily at home.  -holding home antihypertensives given he is normotensive and had been orthostatic on admission  # VTE: heparin   # Diet: HH diet  Code status: full   Dispo: Disposition is deferred at this time, awaiting improvement of current medical problems.  Anticipated discharge in approximately 1 day(s).   The patient does have a current PCP [name], MD) and does not need an [facility] hospital follow-up appointment after discharge.  The patient does not have transportation limitations that hinder transportation to clinic appointments.  .Services Needed at time of discharge: Y = Yes, Blank = No PT:   OT:   RN:   Equipment:   Other:     LOS: 1 day   Rebecca Eaton, MD 03/14/2014, 7:47 AM

## 2014-03-14 NOTE — H&P (Signed)
INTERNAL MEDICINE TEACHING ATTENDING NOTE  Day 1 of stay  Patient name: Willie Williamson  MRN: [id number] Date of birth: January 21, 1931   80 y.o.man admitted with COPD exacerbation and complaint of dizziness since the past 3 weeks. He has been started on solumedrol and feels much better since yesterday. I met him and his son in the morning.   He  has a past medical history of Unspecified essential hypertension; Shortness of breath; Acute bronchitis; Other acute sinusitis; Malignant neoplasm of bladder, part unspecified; Other and unspecified hyperlipidemia; Coronary atherosclerosis of unspecified type of vessel, native or graft; Other emphysema; Chronic airway obstruction, not elsewhere classified; and Hypothyroidism.  Vitals have been stable. He denies chest pain, palpitations, and dizziness at present. He denies confusion. He does admit to taking albuterol "all day long" when he is having COPD flares like he was recently "I have a lot of flares". He says that he did not feel dizzy when he got up from bed briefly this morning. On exam, he has decreased air movement in the lungs but no wheezing noted. Cardiac exam is S1S2 normal, regular rhythm, no murmurs. Abdomen is soft and non-tender. He does not have pedal edema. His peripheral pulses are intact. His skin is warm.   I have reviewed the chart, labs and imaging. I have read Dr Renaldo Fiddler note and agree with documentation.    For COPD exacerbation, we will continue IV solumedrol for now, and reassess tomorrow. I agree with antibiotic cover.   For presyncope - He has been orthostatic. He is denying dizziness today when his antihypertensives have been on hold. I think dizziness could be a component of both volume depletion and antihypertensive side effect. We will cautiously give fluids as needed, and encourage water intake, We will continue to hold BP medications (normal BP right now), and add coreg at 3.125 BID first, and try to hold or cut back  amlodipine.   For lactic acidemia and elevated AG- I am unsure as to why this was. Increased respiratory effort leading to respiratory alkalosis could be a reason.   I have seen and evaluated this patient and discussed it with my IM resident team.  Please see the rest of the plan per resident note from today.   Calan Doren 03/14/2014, 1:48 PM.

## 2014-03-14 NOTE — Progress Notes (Signed)
  Echocardiogram 2D Echocardiogram has been performed.  Willie Williamson 03/14/2014, 12:31 PM

## 2014-03-15 LAB — BASIC METABOLIC PANEL
BUN: 14 mg/dL (ref 6–23)
CALCIUM: 8.7 mg/dL (ref 8.4–10.5)
CO2: 28 mEq/L (ref 19–32)
Chloride: 106 mEq/L (ref 96–112)
Creatinine, Ser: 0.76 mg/dL (ref 0.50–1.35)
GFR, EST NON AFRICAN AMERICAN: 83 mL/min — AB (ref >90)
Glucose, Bld: 93 mg/dL (ref 70–99)
POTASSIUM: 3.8 meq/L (ref 3.7–5.3)
Sodium: 144 mEq/L (ref 137–147)

## 2014-03-15 LAB — CBC WITH DIFFERENTIAL/PLATELET
BASOS ABS: 0 10*3/uL (ref 0.0–0.1)
BASOS PCT: 0 % (ref 0–1)
EOS ABS: 0 10*3/uL (ref 0.0–0.7)
Eosinophils Relative: 0 % (ref 0–5)
HCT: 33 % — ABNORMAL LOW (ref 39.0–52.0)
HEMOGLOBIN: 10.8 g/dL — AB (ref 13.0–17.0)
Lymphocytes Relative: 11 % — ABNORMAL LOW (ref 12–46)
Lymphs Abs: 1.4 10*3/uL (ref 0.7–4.0)
MCH: 30.8 pg (ref 26.0–34.0)
MCHC: 32.7 g/dL (ref 30.0–36.0)
MCV: 94 fL (ref 78.0–100.0)
MONOS PCT: 10 % (ref 3–12)
Monocytes Absolute: 1.3 10*3/uL — ABNORMAL HIGH (ref 0.1–1.0)
NEUTROS PCT: 79 % — AB (ref 43–77)
Neutro Abs: 9.5 10*3/uL — ABNORMAL HIGH (ref 1.7–7.7)
Platelets: 221 10*3/uL (ref 150–400)
RBC: 3.51 MIL/uL — ABNORMAL LOW (ref 4.22–5.81)
RDW: 14.2 % (ref 11.5–15.5)
WBC: 12.1 10*3/uL — ABNORMAL HIGH (ref 4.0–10.5)

## 2014-03-15 MED ORDER — CARVEDILOL 3.125 MG PO TABS
3.1250 mg | ORAL_TABLET | Freq: Two times a day (BID) | ORAL | Status: DC
  Administered 2014-03-15 – 2014-03-16 (×2): 3.125 mg via ORAL
  Filled 2014-03-15 (×4): qty 1

## 2014-03-15 NOTE — Evaluation (Signed)
Physical Therapy Evaluation Patient Details Name: Willie Williamson MRN: [id number] DOB: [birth year] Today's Date: 03/15/2014   History of Present Illness  80 y.o.man admitted with COPD exacerbation and complaint of dizziness since the past 3 weeks.Dizziness likely Orthostatic hypotension, which has resolved (per MD note, orthostatic BPs were negative this am)  Clinical Impression   Patient evaluated by Physical Therapy with no further acute PT needs identified. All education has been completed and the patient has no further questions. Definitely recommend RW for steadiness with amb and energy conservation;  See below for any follow-up Physical Therapy or equipment needs. PT is signing off. Thank you for this referral.     Follow Up Recommendations Home health PT;Supervision - Intermittent    Equipment Recommendations  Rolling walker with 5" wheels  RN informed of rec for RW   Recommendations for Other Services       Precautions / Restrictions Precautions Precautions: Fall      Mobility  Bed Mobility  in chair upon arrival                Transfers Overall transfer level: Modified independent Equipment used: Rolling walker (2 wheeled)             General transfer comment: Noted some dependence on UE support, but overall safe and WFL  Ambulation/Gait Ambulation/Gait assistance: Supervision;Modified independent (Device/Increase time) Ambulation Distance (Feet): 150 Feet Assistive device: Rolling walker (2 wheeled) Gait Pattern/deviations: Step-through pattern     General Gait Details: Cues initially fro RW use and posture; then pt managing well including self-monitor for activiity tolerance, and took one standing rest break; Supervision progressing to modified independent; Walked a few feet without assistive device and noted unsteadiness; Pt would rather use RW, this therapist agrees  Financial trader [employer] (Stroke  Patients Only)       Balance                                             Pertinent Vitals/Pain Session conducted on 2 L O2; Noted some DOE, and pt took one standing rest break; O2 sats 94%    Home Living Family/patient expects to be discharged to:: Private residence Living Arrangements: Children Available Help at Discharge: Family;Available PRN/intermittently Type of Home: House       Home Layout: One level Home Equipment: Walker - standard;Cane - single point      Prior Function Level of Independence: Independent         Comments: Though reports has used cane and walker in the days leading up to this admission     Hand Dominance        Extremity/Trunk Assessment   Upper Extremity Assessment: Overall WFL for tasks assessed           Lower Extremity Assessment: Overall WFL for tasks assessed         Communication   Communication: No difficulties  Cognition Arousal/Alertness: Awake/alert Behavior During Therapy: WFL for tasks assessed/performed Overall Cognitive Status: Within Functional Limits for tasks assessed                      General Comments      Exercises        Assessment/Plan    PT Assessment All further PT needs  can be met in the next venue of care  PT Diagnosis Generalized weakness   PT Problem List Decreased activity tolerance;Decreased balance;Decreased knowledge of use of DME;Cardiopulmonary status limiting activity  PT Treatment Interventions     PT Goals (Current goals can be found in the Care Plan section) Acute Rehab PT Goals Patient Stated Goal: to be able to walk safely PT Goal Formulation: No goals set, d/c therapy    Frequency     Barriers to discharge        Co-evaluation               End of Session Equipment Utilized During Treatment: Gait belt;Oxygen Activity Tolerance: Patient tolerated treatment well;Other (comment) (showed good self-monitor for activity  tolerance) Patient left: in chair;with call bell/phone within reach Nurse Communication: Mobility status         Time: 1135-1155 PT Time Calculation (min): 20 min   Charges:   PT Evaluation $Initial PT Evaluation Tier I: 1 Procedure PT Treatments $Gait Training: 8-22 mins   PT G Codes:          [facility] 03/15/2014, 12:07 PM Roney Marion, Donald Pager [phone] Office [phone]

## 2014-03-15 NOTE — Progress Notes (Addendum)
Subjective: Patient feels well this morning. He states that his breathing is at baseline. Denies any dizziness this morning while moving from the bed to bedside chair, and was able to walk to the bathroom with assistance w/o dizziness. He states that he would like to go home today if the dizziness has subsided.   Objective: Vital signs in last 24 hours: Filed Vitals:   03/14/14 2100 03/14/14 2124 03/14/14 2125 03/15/14 0528  BP: 113/72   142/80  Pulse: 85   76  Temp: 98.3 F (36.8 C)   97.9 F (36.6 C)  TempSrc: Oral   Oral  Resp: 22   20  Height: 5\' 7"  (1.702 m)     Weight: 153 lb 11.2 oz (69.718 kg)     SpO2: 96% 96% 96% 94%   Weight change: -1 lb 4.8 oz (-0.59 kg)  Intake/Output Summary (Last 24 hours) at 03/15/14 0901 Last data filed at 03/15/14 0800  Gross per 24 hour  Intake    900 ml  Output   3000 ml  Net  -2100 ml   Physical Exam General: Sitting up in bed, NAD HEENT: [place]/AT, vision in R eye grossly intact, left eye nonreactive, congenital  Lungs: lungs CTAB with diminished air movement throughout bilateral lung fields; no wheezing, rhonchi, rales Heart: RRR, no murmurs, gallops, or rubs Abdomen: soft, non-tender, non-distended, normal bowel sounds  Extremities: warm and well perfused, no appreciable edema Neurologic: alert & oriented X3, cranial nerves II-XII grossly intact, no focal neurological deficits  Lab Results: Basic Metabolic Panel:  Recent Labs Lab 03/14/14 0049 03/15/14 0545  NA 143 144  K 3.9 3.8  CL 108 106  CO2 23 28  GLUCOSE 176* 93  BUN 16 14  CREATININE 0.75 0.76  CALCIUM 8.4 8.7   Liver Function Tests:  Recent Labs Lab 03/13/14 1211  AST 26  ALT 18  ALKPHOS 65  BILITOT 0.3  PROT 7.7  ALBUMIN 4.2    Recent Labs Lab 03/13/14 1211  LIPASE 28   CBC:  Recent Labs Lab 03/13/14 1211 03/14/14 0049 03/15/14 0545  WBC 18.6* 13.8* 12.1*  NEUTROABS 16.1*  --  9.5*  HGB 12.1* 10.6* 10.8*  HCT 36.6* 32.3* 33.0*  MCV  93.4 93.9 94.0  PLT 275 220 221   Cardiac Enzymes:  Recent Labs Lab 03/13/14 1211 03/13/14 1900 03/14/14 0049  TROPONINI <0.30 <0.30 <0.30   BNP:  Recent Labs Lab 03/13/14 1211  PROBNP 202.8   Coagulation:  Recent Labs Lab 03/13/14 1211  LABPROT 13.7  INR 1.07   Urinalysis:  Recent Labs Lab 03/13/14 1310  COLORURINE YELLOW  LABSPEC 1.023  PHURINE 5.5  GLUCOSEU 250*  HGBUR NEGATIVE  BILIRUBINUR NEGATIVE  KETONESUR NEGATIVE  PROTEINUR NEGATIVE  UROBILINOGEN 0.2  NITRITE NEGATIVE  LEUKOCYTESUR NEGATIVE   Misc. Labs:   Micro Results: Recent Results (from the past 240 hour(s))  CULTURE, BLOOD (ROUTINE X 2)     Status: None   Collection Time    03/13/14  2:50 PM      Result Value Ref Range Status   Specimen Description BLOOD ARM LEFT   Final   Special Requests BOTTLES DRAWN AEROBIC AND ANAEROBIC 10CC   Final   Culture  Setup Time     Final   Value: 03/13/2014 18:47     Performed at [facility]   Culture     Final   Value:        BLOOD CULTURE RECEIVED NO GROWTH  TO DATE CULTURE WILL BE HELD FOR 5 DAYS BEFORE ISSUING A FINAL NEGATIVE REPORT     Performed at [facility]   Report Status PENDING   Incomplete  CULTURE, BLOOD (ROUTINE X 2)     Status: None   Collection Time    03/13/14  3:00 PM      Result Value Ref Range Status   Specimen Description BLOOD HAND LEFT   Final   Special Requests BOTTLES DRAWN AEROBIC ONLY 10CC   Final   Culture  Setup Time     Final   Value: 03/13/2014 18:47     Performed at [facility]   Culture     Final   Value:        BLOOD CULTURE RECEIVED NO GROWTH TO DATE CULTURE WILL BE HELD FOR 5 DAYS BEFORE ISSUING A FINAL NEGATIVE REPORT     Performed at [facility]   Report Status PENDING   Incomplete    Studies/Results: Dg Chest 2 View  03/13/2014   CLINICAL DATA:  One day history of chest pain and shortness of breath  EXAM: CHEST  2 VIEW  COMPARISON:  Prior chest x-ray 09/11/2012   FINDINGS: Cardiac and mediastinal contours within normal limits. Aortic atherosclerosis. The lungs are slightly hyperinflated. Mild such bronchitic changes and interstitial prominence similar compared to prior. Negative for pneumothorax, pleural effusion or overt pulmonary edema. No suspicious pulmonary nodule or mass. Visualized upper abdominal bowel gas pattern is unremarkable. No acute osseous abnormality.  IMPRESSION: 1. Stable chest x-ray without evidence of active disease. 2. Chronic changes of COPD.   Electronically Signed   By: [name]   On: 03/13/2014 12:41   Medications: I have reviewed the patient's current medications. Scheduled Meds: . aspirin EC  81 mg Oral Daily  . azithromycin  500 mg Oral Daily  . heparin  5,000 Units Subcutaneous 3 times per day  . levothyroxine  88 mcg Oral QAC breakfast  . mometasone-formoterol  2 puff Inhalation BID  . predniSONE  40 mg Oral Q breakfast  . simvastatin  20 mg Oral q1800  . sodium chloride  1,000 mL Intravenous Once  . sodium chloride  3 mL Intravenous Q12H  . timolol  1 drop Left Eye Daily   Continuous Infusions:   PRN Meds:.albuterol, promethazine Assessment/Plan:  # COPD exacerbation: Normal work of breathing today and pt feels like he is at his baseline. Tachycardia has resolved. Remains afebrile. Leukocytosis down trending, WBC 18-->13.8 --> 12.1. -duonebs q6 hr PRN -prednisone 40mg  PO daily x4 days -azithromycin 500mg  PO daily, day 3/5 -Pt on Advair at home, will give dulera per formulary  -BCx x 2 NGTD  # Presyncope: Patient with resolution of dizziness this morning, but has not been up walking around yet. Orthostatics positive per HR last night and repeat orthostatics this morning (after approx 3L NS) negative. Orthostasis and lightheadedness most likely due to hypovolemia. ACS ruled out with three negative troponins. Echo today showed EF 44-31%, grade 1 diastolic dysfunction, no significant cardiac structural  abnormality. -nursing to ambulate -continue home zocor, ASA 81mg  daily   # Elevated AG metabolic acidosis: Resolved. Lactate down trending (3.78-->2.4). AG 10 (down from 18 on admission).  # Hypothyroidism: Patient on synthroid 68mcg daily at home.  -continue home regimen   # HTN: Patient with normal-soft blood pressure on admission (111-120/50-60s). BPs increasing. He is on coreg 6.25mg  BID (?decreased by PCP to daily) and amlodipine 5mg  daily at home.  -  Restarting Coreg at 3.125mg  BID, which will need to be monitored and increased by his PCP  # DVT PPx: heparin    Dispo: D/c to home today or tomorrow  The patient does have a current PCP [name], MD) and does not need an [facility] hospital follow-up appointment after discharge.  The patient does not have transportation limitations that hinder transportation to clinic appointments.  .Services Needed at time of discharge: Y = Yes, Blank = No PT:   OT:   RN:   Equipment:   Other:     LOS: 2 days   Otho Bellows, MD 03/15/2014, 9:01 AM

## 2014-03-16 DIAGNOSIS — I951 Orthostatic hypotension: Secondary | ICD-10-CM

## 2014-03-16 MED ORDER — AZITHROMYCIN 500 MG PO TABS: 500.0000 mg | ORAL_TABLET | Freq: Every day | ORAL | Status: DC

## 2014-03-16 MED ORDER — PREDNISONE 20 MG PO TABS: 40.0000 mg | ORAL_TABLET | Freq: Every day | ORAL | Status: DC

## 2014-03-16 MED ORDER — CARVEDILOL 3.125 MG PO TABS: 3.1250 mg | ORAL_TABLET | Freq: Two times a day (BID) | ORAL | Status: DC

## 2014-03-16 NOTE — Progress Notes (Signed)
Pt with d/c summary complete. Telemetry d/c'd, Natalie with CMT notified, box [phone] removed, cleaned, and returned to [place]. Will continue to monitor.

## 2014-03-16 NOTE — Care Management Note (Signed)
CARE MANAGEMENT NOTE 03/16/2014  Patient:  Willie Williamson, Willie Williamson   Account Number:  [id number]  Date Initiated:  03/16/2014  Documentation initiated by:  Richetta Cubillos  Subjective/Objective Assessment:   Noted order for HHPT and  rolling walker.     Action/Plan:   03/16/2014 Met with pt who has home oxygen from [facility], has portable tank at home and his son will bring to the hospital for use at time of d/c. RW and HHPT ordered.   Anticipated DC Date:  03/16/2014   Anticipated DC Plan:  [place]         Choice offered to / List presented to:  C-1 Patient   DME arranged  Vassie Moselle      DME agency  [place] arranged  [place].   Status of service:  Completed, signed off Medicare Important Message given?   (If response is "NO", the following Medicare IM given date fields will be blank) Date Medicare IM given:   Date Additional Medicare IM given:    Discharge Disposition:  [place]  Per UR Regulation:    If discussed at Long Length of Stay Meetings, dates discussed:    Comments:

## 2014-03-16 NOTE — Progress Notes (Signed)
Pt signed d/c papers. Instructions given on follow up appts and s/sx of copd exacerbation to return for or call md. IV removed. All questions answered. Pt is awaiting ride, will continue to monitor.

## 2014-03-16 NOTE — Discharge Instructions (Signed)
Please take the prednisone 40mg  for one more dose tomorrow. Please take azithromycin (an antibiotic) 500mg  for one dose tomorrow as well.  Chronic Obstructive Pulmonary Disease Chronic obstructive pulmonary disease (COPD) is a common lung condition in which airflow from the lungs is limited. COPD is a general term that can be used to describe many different lung problems that limit airflow, including both chronic bronchitis and emphysema. If you have COPD, your lung function will probably never return to normal, but there are measures you can take to improve lung function and make yourself feel better.  CAUSES   Smoking (common).   Exposure to secondhand smoke.   Genetic problems.  Chronic inflammatory lung diseases or recurrent infections. SYMPTOMS   Shortness of breath, especially with physical activity.   Deep, persistent (chronic) cough with a large amount of thick mucus.   Wheezing.   Rapid breaths (tachypnea).   Gray or bluish discoloration (cyanosis) of the skin, especially in fingers, toes, or lips.   Fatigue.   Weight loss.   Frequent infections or episodes when breathing symptoms become much worse (exacerbations).   Chest tightness. DIAGNOSIS  Your healthcare provider will take a medical history and perform a physical examination to make the initial diagnosis. Additional tests for COPD may include:   Lung (pulmonary) function tests.  Chest X-ray.  CT scan.  Blood tests. TREATMENT  Treatment available to help you feel better when you have COPD include:   Inhaler and nebulizer medicines. These help manage the symptoms of COPD and make your breathing more comfortable  Supplemental oxygen. Supplemental oxygen is only helpful if you have a low oxygen level in your blood.   Exercise and physical activity. These are beneficial for nearly all people with COPD. Some people may also benefit from a pulmonary rehabilitation program. HOME CARE INSTRUCTIONS     Take all medicines (inhaled or pills) as directed by your health care provider.  Only take over-the-counter or prescription medicines for pain, fever, or discomfort as directed by your health care provider.   Avoid over-the-counter medicines or cough syrups that dry up your airway (such as antihistamines) and slow down the elimination of secretions unless instructed otherwise by your healthcare provider.   If you are a smoker, the most important thing that you can do is stop smoking. Continuing to smoke will cause further lung damage and breathing trouble. Ask your health care provider for help with quitting smoking. He or she can direct you to community resources or hospitals that provide support.  Avoid exposure to irritants such as smoke, chemicals, and fumes that aggravate your breathing.  Use oxygen therapy and pulmonary rehabilitation if directed by your health care provider. If you require home oxygen therapy, ask your healthcare provider whether you should purchase a pulse oximeter to measure your oxygen level at home.   Avoid contact with individuals who have a contagious illness.  Avoid extreme temperature and humidity changes.  Eat healthy foods. Eating smaller, more frequent meals and resting before meals may help you maintain your strength.  Stay active, but balance activity with periods of rest. Exercise and physical activity will help you maintain your ability to do things you want to do.  Preventing infection and hospitalization is very important when you have COPD. Make sure to receive all the vaccines your health care provider recommends, especially the pneumococcal and influenza vaccines. Ask your healthcare provider whether you need a pneumonia vaccine.  Learn and use relaxation techniques to manage stress.  Learn  and use controlled breathing techniques as directed by your health care provider. Controlled breathing techniques include:   Pursed lip breathing.  Start by breathing in (inhaling) through your nose for 1 second. Then, purse your lips as if you were going to whistle and breathe out (exhale) through the pursed lips for 2 seconds.   Diaphragmatic breathing. Start by putting one hand on your abdomen just above your waist. Inhale slowly through your nose. The hand on your abdomen should move out. Then purse your lips and exhale slowly. You should be able to feel the hand on your abdomen moving in as you exhale.   Learn and use controlled coughing to clear mucus from your lungs. Controlled coughing is a series of short, progressive coughs. The steps of controlled coughing are:  1. Lean your head slightly forward.  2. Breathe in deeply using diaphragmatic breathing.  3. Try to hold your breath for 3 seconds.  4. Keep your mouth slightly open while coughing twice.  5. Spit any mucus out into a tissue.  6. Rest and repeat the steps once or twice as needed. SEEK MEDICAL CARE IF:   You are coughing up more mucus than usual.   There is a change in the color or thickness of your mucus.   Your breathing is more labored than usual.   Your breathing is faster than usual.  SEEK IMMEDIATE MEDICAL CARE IF:   You have shortness of breath while you are resting.   You have shortness of breath that prevents you from:  Being able to talk.   Performing your usual physical activities.   You have chest pain lasting longer than 5 minutes.   Your skin color is more cyanotic than usual.  You measure low oxygen saturations for longer than 5 minutes with a pulse oximeter. MAKE SURE YOU:   Understand these instructions.  Will watch your condition.  Will get help right away if you are not doing well or get worse. Document Released: 08/16/2005 Document Revised: 08/27/2013 Document Reviewed: 07/03/2013 [facility] Patient Information 2014 [place], [place].

## 2014-03-16 NOTE — Progress Notes (Signed)
Subjective: Patient has no complaints this morning. No more dizziness when he got up and walked yesterday. He has not been out of bed yet today as he does not have a portable oxygen device. Breathing is at baseline, denies CP, abd pain, N/V/D.  Objective: Vital signs in last 24 hours: Filed Vitals:   03/15/14 2143 03/16/14 0518 03/16/14 0850 03/16/14 0902  BP: 125/79 126/79  135/77  Pulse: 85 71  87  Temp: 98.7 F (37.1 C) 98.3 F (36.8 C)  98.1 F (36.7 C)  TempSrc: Oral Oral  Oral  Resp: 20 18  18   Height: 5\' 7"  (1.702 m)     Weight: 149 lb 6.4 oz (67.767 kg)     SpO2: 96% 98% 97% 96%   Weight change: -4 lb 4.8 oz (-1.95 kg)  Intake/Output Summary (Last 24 hours) at 03/16/14 1038 Last data filed at 03/16/14 0951  Gross per 24 hour  Intake    720 ml  Output   2475 ml  Net  -1755 ml   Physical Exam General: Sitting up in bed, NAD, breathing comfortable. HEENT: [place]/AT, vision in R eye grossly intact, blind in L eye Lungs: lungs CTAB with diminished air movement throughout bilateral lung fields Heart: RRR Abdomen: soft, non-tender, non-distended, normal bowel sounds  Extremities: warm and well perfused, no pedal edema Neurologic: alert & oriented X3, cranial nerves II-XII grossly intact  Lab Results: Basic Metabolic Panel:  Recent Labs Lab 03/14/14 0049 03/15/14 0545  NA 143 144  K 3.9 3.8  CL 108 106  CO2 23 28  GLUCOSE 176* 93  BUN 16 14  CREATININE 0.75 0.76  CALCIUM 8.4 8.7   Liver Function Tests:  Recent Labs Lab 03/13/14 1211  AST 26  ALT 18  ALKPHOS 65  BILITOT 0.3  PROT 7.7  ALBUMIN 4.2    Recent Labs Lab 03/13/14 1211  LIPASE 28   CBC:  Recent Labs Lab 03/13/14 1211 03/14/14 0049 03/15/14 0545  WBC 18.6* 13.8* 12.1*  NEUTROABS 16.1*  --  9.5*  HGB 12.1* 10.6* 10.8*  HCT 36.6* 32.3* 33.0*  MCV 93.4 93.9 94.0  PLT 275 220 221   Cardiac Enzymes:  Recent Labs Lab 03/13/14 1211 03/13/14 1900 03/14/14 0049  TROPONINI  <0.30 <0.30 <0.30   BNP:  Recent Labs Lab 03/13/14 1211  PROBNP 202.8   Coagulation:  Recent Labs Lab 03/13/14 1211  LABPROT 13.7  INR 1.07   Urinalysis:  Recent Labs Lab 03/13/14 1310  COLORURINE YELLOW  LABSPEC 1.023  PHURINE 5.5  GLUCOSEU 250*  HGBUR NEGATIVE  BILIRUBINUR NEGATIVE  KETONESUR NEGATIVE  PROTEINUR NEGATIVE  UROBILINOGEN 0.2  NITRITE NEGATIVE  LEUKOCYTESUR NEGATIVE   Misc. Labs:   Micro Results: Recent Results (from the past 240 hour(s))  CULTURE, BLOOD (ROUTINE X 2)     Status: None   Collection Time    03/13/14  2:50 PM      Result Value Ref Range Status   Specimen Description BLOOD ARM LEFT   Final   Special Requests BOTTLES DRAWN AEROBIC AND ANAEROBIC 10CC   Final   Culture  Setup Time     Final   Value: 03/13/2014 18:47     Performed at [facility]   Culture     Final   Value:        BLOOD CULTURE RECEIVED NO GROWTH TO DATE CULTURE WILL BE HELD FOR 5 DAYS BEFORE ISSUING A FINAL NEGATIVE REPORT     Performed  at [facility]   Report Status PENDING   Incomplete  CULTURE, BLOOD (ROUTINE X 2)     Status: None   Collection Time    03/13/14  3:00 PM      Result Value Ref Range Status   Specimen Description BLOOD HAND LEFT   Final   Special Requests BOTTLES DRAWN AEROBIC ONLY 10CC   Final   Culture  Setup Time     Final   Value: 03/13/2014 18:47     Performed at [facility]   Culture     Final   Value:        BLOOD CULTURE RECEIVED NO GROWTH TO DATE CULTURE WILL BE HELD FOR 5 DAYS BEFORE ISSUING A FINAL NEGATIVE REPORT     Performed at [facility]   Report Status PENDING   Incomplete    Studies/Results: No results found. Medications: I have reviewed the patient's current medications. Scheduled Meds: . aspirin EC  81 mg Oral Daily  . azithromycin  500 mg Oral Daily  . carvedilol  3.125 mg Oral BID WC  . heparin  5,000 Units Subcutaneous 3 times per day  . levothyroxine  88 mcg Oral QAC breakfast   . mometasone-formoterol  2 puff Inhalation BID  . predniSONE  40 mg Oral Q breakfast  . simvastatin  20 mg Oral q1800  . sodium chloride  1,000 mL Intravenous Once  . sodium chloride  3 mL Intravenous Q12H  . timolol  1 drop Left Eye Daily   Continuous Infusions:   PRN Meds:.albuterol, promethazine Assessment/Plan:  # COPD exacerbation: Normal work of breathing today and pt feels like he is at his baseline. Tachycardia has resolved. Remains afebrile. Stable for discharge. -duonebs q6 hr PRN -prednisone 40mg  PO daily x 4 days -azithromycin 500mg  PO daily, day 4/5 -Pt on Advair at home, giving dulera per formulary  -BCx x 2 NGTD  # Presyncope: Patient with resolution of dizziness this morning, but has not been up walking around yet.  Orthostasis and lightheadedness most likely due to hypovolemia as patient is now asymptomatic after IVF. Echo unrevealing. Stable for discharge. -restarted coreg a 3.125mg  BID yesterday and patient seems to be doing well.  -recheck orthostatics   -PT to see this AM -continue home zocor, ASA 81mg  daily   # Elevated AG metabolic acidosis: Resolved.  # Hypothyroidism: Patient on synthroid 79mcg daily at home.  -continue home regimen   # HTN: Stable after restarting lower dose of coreg yesterday. Will need to recheck orthostatics prior to discharge.  - Coreg at 3.125mg  BID, which will need to be monitored and increased by his PCP -holding home amlodipine   # DVT PPx: heparin   Dispo: D/c to home today  The patient does have a current PCP [name], MD) and does not need an [facility] hospital follow-up appointment after discharge.  The patient does not have transportation limitations that hinder transportation to clinic appointments.  .Services Needed at time of discharge: Y = Yes, Blank = No PT:   OT:   RN:   Equipment:   Other:     LOS: 3 days   Rebecca Eaton, MD 03/16/2014, 10:38 AM

## 2014-03-16 NOTE — Progress Notes (Signed)
Orthostatic VS completed, as follows:  Lying 142/86 Sitting 155/90 Standing 152/89

## 2014-03-16 NOTE — Progress Notes (Signed)
I saw the patient in the morning. He appears to be doing well. Denies dizziness and walked with PT with no complaints. I have read the note by Dr Mechele Claude from today, and discussed the plan for this patient with her. Please see her note for details of the management. The patient has had stable vitals, shown improvement with COPD symptoms, and is not dizzy any more since we held back some of his antihypertensive meds. He is stable for discharge with adequate follow up.

## 2014-03-17 DIAGNOSIS — I951 Orthostatic hypotension: Secondary | ICD-10-CM | POA: Diagnosis present

## 2014-03-19 LAB — CULTURE, BLOOD (ROUTINE X 2)
CULTURE: NO GROWTH
CULTURE: NO GROWTH

## 2014-03-20 NOTE — Discharge Summary (Signed)
INTERNAL MEDICINE ATTENDING DISCHARGE COSIGN   I evaluated the patient on the day of discharge and discussed the discharge plan with my resident team. I agree with the discharge documentation and disposition.   Shalea Tomczak 03/20/2014, 10:09 AM

## 2014-03-25 ENCOUNTER — Telehealth: Payer: Self-pay | Admitting: Cardiovascular Disease

## 2014-03-25 ENCOUNTER — Encounter: Payer: Self-pay | Admitting: *Deleted

## 2014-03-25 ENCOUNTER — Encounter: Payer: Self-pay | Admitting: Cardiovascular Disease

## 2014-03-25 ENCOUNTER — Ambulatory Visit (INDEPENDENT_AMBULATORY_CARE_PROVIDER_SITE_OTHER): Payer: Medicare HMO | Admitting: Cardiovascular Disease

## 2014-03-25 VITALS — BP 154/83 | HR 82 | Ht 67.0 in | Wt 150.0 lb

## 2014-03-25 DIAGNOSIS — R42 Dizziness and giddiness: Secondary | ICD-10-CM

## 2014-03-25 DIAGNOSIS — J449 Chronic obstructive pulmonary disease, unspecified: Secondary | ICD-10-CM

## 2014-03-25 DIAGNOSIS — I10 Essential (primary) hypertension: Secondary | ICD-10-CM

## 2014-03-25 DIAGNOSIS — R55 Syncope and collapse: Secondary | ICD-10-CM

## 2014-03-25 DIAGNOSIS — I2510 Atherosclerotic heart disease of native coronary artery without angina pectoris: Secondary | ICD-10-CM

## 2014-03-25 NOTE — Telephone Encounter (Signed)
Pt has [employer], For Lexiscan=Medsolns [phone] and for Brain MRI=Auth#[id number], both expire 06-23-14

## 2014-03-25 NOTE — Progress Notes (Signed)
Patient ID: Willie Williamson, male   DOB: Jan 23, 1931, 80 y.o.   MRN: [id number]      SUBJECTIVE: Willie Williamson is an 80 year old male with a history of COPD, bladder cancer, nonobstructive coronary disease with normal LV function. He has severe dyspnea secondary to COPD/emphysema and is followed closely by Dr. Lyda Jester.  He was hospitalized in late April for a COPD exacerbation and orthostatic hypotension. He was found to be dehydrated and pre-syncopal, and amlodipine was discontinued and carvedilol dosage was reduced. BNP and troponins were normal at that time. He underwent an echocardiogram on 03/14/2014 which demonstrated normal left ventricular systolic function, EF 02-58%, normal regional wall motion, and grade 1 diastolic dysfunction.  Approximately 3 weeks ago, he began feeling acutely dizzy. He has had several near syncopal episodes and now has a walker. He denies chest pain. I reviewed the results of his cardiac catheterization performed on 10/15/2006. At that time he had several nonobstructive lesions of 25-30%. He did have a proximal RCA stenosis of 60%. He denies any unilateral weakness. He also denies headaches.  He chronically uses 2 L of oxygen.  Allergies  Allergen Reactions  . Penicillins     REACTION: rash    Current Outpatient Prescriptions  Medication Sig Dispense Refill  . albuterol (PROVENTIL HFA;VENTOLIN HFA) 108 (90 BASE) MCG/ACT inhaler Inhale 2 puffs into the lungs every 4 (four) hours as needed.  1 Inhaler  6  . aspirin 81 MG tablet Take 81 mg by mouth daily.        Marland Kitchen azithromycin (ZITHROMAX) 500 MG tablet Take 500 mg by mouth as directed.      . carvedilol (COREG) 3.125 MG tablet Take 1 tablet (3.125 mg total) by mouth 2 (two) times daily with a meal.  60 tablet  0  . fluticasone (FLONASE) 50 MCG/ACT nasal spray Place 2 sprays into both nostrils daily as needed.      . Fluticasone-Salmeterol (ADVAIR DISKUS) 500-50 MCG/DOSE AEPB INHALE 1 PUFF TWICE A DAY  60 each  11   . GuaiFENesin (MUCINEX PO) Take 400 mg by mouth 3 (three) times daily as needed (for congestion).       Marland Kitchen levothyroxine (SYNTHROID, LEVOTHROID) 88 MCG tablet Take 88 mcg by mouth daily before breakfast.      . loratadine (CLARITIN) 10 MG tablet Take 10 mg by mouth daily as needed for allergies.      Marland Kitchen lovastatin (MEVACOR) 20 MG tablet Take 20 mg by mouth at bedtime.        . OXYGEN-HELIUM IN Inhale 2 L into the lungs continuous as needed.      . predniSONE (DELTASONE) 20 MG tablet Take 40 mg by mouth as directed.      Marland Kitchen scopolamine (HYOSCINE) 0.25 % ophthalmic solution Place 1 drop into the left eye as needed.       . timolol (BETIMOL) 0.5 % ophthalmic solution Place 1 drop into the left eye as needed.       No current facility-administered medications for this visit.    Past Medical History  Diagnosis Date  . Unspecified essential hypertension   . Shortness of breath   . Acute bronchitis   . Other acute sinusitis   . Malignant neoplasm of bladder, part unspecified   . Other and unspecified hyperlipidemia   . Coronary atherosclerosis of unspecified type of vessel, native or graft   . Other emphysema   . Chronic airway obstruction, not elsewhere classified   . Hypothyroidism  Past Surgical History  Procedure Laterality Date  . Turp vaporization    . Bladder surgery      History   Social History  . Marital Status: Married    Spouse Name: N/A    Number of Children: N/A  . Years of Education: N/A   Occupational History  . retired    Social History Main Topics  . Smoking status: Former Smoker -- 1.50 packs/day for 48 years    Types: Cigarettes    Quit date: 11/20/1996  . Smokeless tobacco: Never Used  . Alcohol Use: Not on file  . Drug Use: Not on file  . Sexual Activity: Not on file   Other Topics Concern  . Not on file   Social History Narrative  . No narrative on file     Filed Vitals:   03/25/14 0900  BP: 154/83  Pulse: 82  Height: 5\' 7"  (1.702 m)    Weight: 150 lb (68.04 kg)  SpO2: 99%    PHYSICAL EXAM General: NAD, wearing oxygen  Neck: No JVD, no thyromegaly or thyroid nodule.  Lungs: Distant breath sounds bilaterally  CV: distant heart tones obscured by lungs  Abdomen: Soft, nontender, no hepatosplenomegaly, no distention.  Neurologic: Alert and oriented x 3.  Psych: Normal affect.  Extremities: No clubbing or cyanosis.    ECG: reviewed and available in electronic records.      ASSESSMENT AND PLAN:  Dizziness His symptoms began rather acutely roughly 3 weeks ago. Given his demonstrated coronary artery plaque disease noted 8 years ago by cardiac catheterization, the likelihood of having neurologic microvascular disease is quite high. Vertebrobasilar insufficiency could be causing his symptoms. I will obtain an MRI of the brain.  CORONARY HEART DISEASE  No chest pain, but I wonder if his lesions noted 8 years ago have progressed given his new symptoms of dizziness. He did have a proximal RCA stenosis of 60% at that time. I will obtain a dobutamine Cardiolite stress test for further assessment.  C O P D  Followed on a regular basis by Dr. Joya Gaskins. On reduced dose of carvedilol, patient tolerating well.   HYPERTENSION  Blood pressure mildly elevated after discontinuation of amlodipine and reduced dose of carvedilol. However, this is acceptable given his age as per recent guidelines.  Dispo: f/u 3 weeks.   Kate Sable, M.D., F.A.C.C.

## 2014-03-25 NOTE — Telephone Encounter (Signed)
MRI of Brain scheduled for 03-30-14 at [facility]. Checking percert

## 2014-03-25 NOTE — Telephone Encounter (Signed)
Checking percert for dobutamine myoview. Scheduled for 03/31/14 at [facility]

## 2014-03-25 NOTE — Patient Instructions (Signed)
Your physician has requested that you have a dobutamine myoview. For furth information please visit [email or web address]. Please follow instruction sheet, as given. MRI brain  Office will contact with results via phone or letter.   Continue all current medications. Follow up in  3 weeks

## 2014-03-30 ENCOUNTER — Encounter: Payer: Self-pay | Admitting: *Deleted

## 2014-03-30 ENCOUNTER — Ambulatory Visit (HOSPITAL_COMMUNITY)
Admission: RE | Admit: 2014-03-30 | Discharge: 2014-03-30 | Disposition: A | Discharge status: Home / Self Care | Payer: Medicare HMO | Source: Ambulatory Visit | Attending: Cardiovascular Disease | Admitting: Cardiovascular Disease

## 2014-03-30 DIAGNOSIS — R42 Dizziness and giddiness: Secondary | ICD-10-CM

## 2014-03-30 DIAGNOSIS — I6789 Other cerebrovascular disease: Secondary | ICD-10-CM | POA: Insufficient documentation

## 2014-03-30 DIAGNOSIS — R55 Syncope and collapse: Secondary | ICD-10-CM

## 2014-03-31 ENCOUNTER — Encounter (HOSPITAL_COMMUNITY): Payer: Self-pay

## 2014-03-31 ENCOUNTER — Encounter (HOSPITAL_COMMUNITY)
Admission: RE | Admit: 2014-03-31 | Discharge: 2014-03-31 | Disposition: A | Discharge status: Home / Self Care | Payer: Medicare HMO | Source: Ambulatory Visit | Attending: Cardiovascular Disease | Admitting: Cardiovascular Disease

## 2014-03-31 DIAGNOSIS — R42 Dizziness and giddiness: Secondary | ICD-10-CM | POA: Insufficient documentation

## 2014-03-31 DIAGNOSIS — R55 Syncope and collapse: Secondary | ICD-10-CM

## 2014-03-31 DIAGNOSIS — I2510 Atherosclerotic heart disease of native coronary artery without angina pectoris: Secondary | ICD-10-CM

## 2014-03-31 MED ORDER — TECHNETIUM TC 99M SESTAMIBI GENERIC - CARDIOLITE
30.0000 | Freq: Once | INTRAVENOUS | Status: AC | PRN
  Administered 2014-03-31: 30 via INTRAVENOUS

## 2014-03-31 MED ORDER — DOBUTAMINE IN D5W 4-5 MG/ML-% IV SOLN
INTRAVENOUS | Status: AC
  Administered 2014-03-31: 10 ug/kg/min via INTRAVENOUS
  Filled 2014-03-31: qty 250

## 2014-03-31 MED ORDER — SODIUM CHLORIDE 0.9 % IJ SOLN
INTRAMUSCULAR | Status: AC
  Administered 2014-03-31: 10 mL via INTRAVENOUS
  Filled 2014-03-31: qty 10

## 2014-03-31 MED ORDER — TECHNETIUM TC 99M SESTAMIBI - CARDIOLITE
10.0000 | Freq: Once | INTRAVENOUS | Status: AC | PRN
  Administered 2014-03-31: 10 via INTRAVENOUS

## 2014-03-31 NOTE — Progress Notes (Signed)
Stress Lab Nurses Notes - Willie Williamson  Willie Williamson 03/31/2014 Reason for doing test: CAD & Dizziness Type of test: Dobtamine Cardiolite Nurse performing test: Gerrit Halls, RN Nuclear Medicine Tech: Melburn Hake Echo Tech: Not Applicable MD performing test: Branch/K.Lawrence NP Family MD: Burdine Test explained and consent signed: yes IV started: 22g jelco, Saline lock flushed, No redness or edema and Saline lock started in radiology Symptoms: SOB O2 Sat 96% with O2 in progress @ 2L/min via N/C ( from home) Treatment/Intervention: None Reason test stopped: reached target HR After recovery IV was: Discontinued via X-ray tech and No redness or edema Patient to return to [place]. Med at : 11:35 Patient discharged: Home Patient's Condition upon discharge was: stable Comments: During test  Peak BP 194/88 & HR 141.  Recovery BP 152/86 & HR 93.  Symptoms resolved in recovery. Willie Williamson

## 2014-04-02 ENCOUNTER — Telehealth: Payer: Self-pay | Admitting: *Deleted

## 2014-04-02 NOTE — Telephone Encounter (Signed)
Message copied by Laurine Blazer on Thu Apr 02, 2014 11:39 AM ------      Message from: Kate Sable A      Created: Wed Apr 01, 2014  9:57 AM       I will discuss results at [facility]. ------

## 2014-04-02 NOTE — Telephone Encounter (Signed)
Notes Recorded by Laurine Blazer, LPN on 04/26/3709 at 62:69 AM Patient notified. Follow up scheduled for 04/15/2014 with Dr. Bronson Ing.

## 2014-04-15 ENCOUNTER — Encounter: Payer: Self-pay | Admitting: Cardiovascular Disease

## 2014-04-15 ENCOUNTER — Other Ambulatory Visit: Payer: Self-pay | Admitting: Cardiovascular Disease

## 2014-04-15 ENCOUNTER — Ambulatory Visit (INDEPENDENT_AMBULATORY_CARE_PROVIDER_SITE_OTHER): Payer: Medicare HMO | Admitting: Cardiovascular Disease

## 2014-04-15 ENCOUNTER — Telehealth: Payer: Self-pay | Admitting: Cardiovascular Disease

## 2014-04-15 ENCOUNTER — Encounter: Payer: Self-pay | Admitting: *Deleted

## 2014-04-15 ENCOUNTER — Encounter (HOSPITAL_COMMUNITY): Payer: Self-pay | Admitting: Pharmacy Technician

## 2014-04-15 VITALS — BP 146/71 | HR 108 | Ht 67.0 in | Wt 148.0 lb

## 2014-04-15 DIAGNOSIS — R9439 Abnormal result of other cardiovascular function study: Secondary | ICD-10-CM

## 2014-04-15 DIAGNOSIS — R42 Dizziness and giddiness: Secondary | ICD-10-CM

## 2014-04-15 DIAGNOSIS — R55 Syncope and collapse: Secondary | ICD-10-CM

## 2014-04-15 DIAGNOSIS — R0602 Shortness of breath: Secondary | ICD-10-CM

## 2014-04-15 DIAGNOSIS — J449 Chronic obstructive pulmonary disease, unspecified: Secondary | ICD-10-CM

## 2014-04-15 DIAGNOSIS — I10 Essential (primary) hypertension: Secondary | ICD-10-CM

## 2014-04-15 DIAGNOSIS — R931 Abnormal findings on diagnostic imaging of heart and coronary circulation: Secondary | ICD-10-CM

## 2014-04-15 DIAGNOSIS — I2510 Atherosclerotic heart disease of native coronary artery without angina pectoris: Secondary | ICD-10-CM

## 2014-04-15 NOTE — Progress Notes (Signed)
Patient ID: Willie Williamson, male   DOB: 12-28-1930, 80 y.o.   MRN: [id number]      SUBJECTIVE: Willie Williamson is an 80 year old male with a history of COPD, bladder cancer, nonobstructive coronary disease with normal LV function. He has severe dyspnea secondary to COPD/emphysema and is followed closely by Dr. Lyda Jester.  He was hospitalized in late April for a COPD exacerbation and orthostatic hypotension. He was found to be dehydrated and pre-syncopal, and amlodipine was discontinued and carvedilol dosage was reduced. BNP and troponins were normal at that time.  He underwent an echocardiogram on 03/14/2014 which demonstrated normal left ventricular systolic function, EF 40-98%, normal regional wall motion, and grade 1 diastolic dysfunction.   Approximately 6 weeks ago, he began feeling acutely dizzy. He had several near syncopal episodes and now has a walker. He denied chest pain at that time. I reviewed the results of his cardiac catheterization performed on 10/15/2006. At that time he had several nonobstructive lesions of 25-30%. He did have a proximal RCA stenosis of 60%. He denies any unilateral weakness. He also denies headaches.  He chronically uses 2 L of oxygen.   Brain MRI demonstrated chronic microvascular ischemia without acute infarct. Dobutamine Cardiolite stress test demonstrated a large inferior/inferoseptal infarct with moderate peri-infarct ischemia. I will proceed with coronary angiography.  He again denies chest pain today, but believes he is having "a flareup of COPD".   Allergies  Allergen Reactions  . Penicillins     REACTION: rash    Current Outpatient Prescriptions  Medication Sig Dispense Refill  . albuterol (PROVENTIL HFA;VENTOLIN HFA) 108 (90 BASE) MCG/ACT inhaler Inhale 2 puffs into the lungs every 4 (four) hours as needed.  1 Inhaler  6  . aspirin 81 MG tablet Take 81 mg by mouth daily.        . carvedilol (COREG) 3.125 MG tablet Take 1 tablet (3.125 mg total) by  mouth 2 (two) times daily with a meal.  60 tablet  0  . fluticasone (FLONASE) 50 MCG/ACT nasal spray Place 2 sprays into both nostrils daily as needed.      . Fluticasone-Salmeterol (ADVAIR DISKUS) 500-50 MCG/DOSE AEPB INHALE 1 PUFF TWICE A DAY  60 each  11  . GuaiFENesin (MUCINEX PO) Take 400 mg by mouth 3 (three) times daily as needed (for congestion).       Marland Kitchen levothyroxine (SYNTHROID, LEVOTHROID) 88 MCG tablet Take 88 mcg by mouth daily before breakfast.      . loratadine (CLARITIN) 10 MG tablet Take 10 mg by mouth daily as needed for allergies.      Marland Kitchen lovastatin (MEVACOR) 20 MG tablet Take 20 mg by mouth at bedtime.        . OXYGEN-HELIUM IN Inhale 2 L into the lungs continuous as needed.      Marland Kitchen scopolamine (HYOSCINE) 0.25 % ophthalmic solution Place 1 drop into the left eye as needed.       . timolol (BETIMOL) 0.5 % ophthalmic solution Place 1 drop into the left eye as needed.       No current facility-administered medications for this visit.    Past Medical History  Diagnosis Date  . Unspecified essential hypertension   . Shortness of breath   . Acute bronchitis   . Other acute sinusitis   . Malignant neoplasm of bladder, part unspecified   . Other and unspecified hyperlipidemia   . Coronary atherosclerosis of unspecified type of vessel, native or graft   . Other  emphysema   . Chronic airway obstruction, not elsewhere classified   . Hypothyroidism     Past Surgical History  Procedure Laterality Date  . Turp vaporization    . Bladder surgery      History   Social History  . Marital Status: Married    Spouse Name: N/A    Number of Children: N/A  . Years of Education: N/A   Occupational History  . retired    Social History Main Topics  . Smoking status: Former Smoker -- 1.50 packs/day for 48 years    Types: Cigarettes    Quit date: 11/20/1996  . Smokeless tobacco: Never Used  . Alcohol Use: Not on file  . Drug Use: Not on file  . Sexual Activity: Not on file    Other Topics Concern  . Not on file   Social History Narrative  . No narrative on file     Filed Vitals:   04/15/14 0917  BP: 146/71  Pulse: 108  Height: 5\' 7"  (1.702 m)  Weight: 148 lb (67.132 kg)  SpO2: 96%    PHYSICAL EXAM General: NAD, wearing oxygen  Neck: No JVD, no thyromegaly or thyroid nodule.  Lungs: Distant breath sounds bilaterally  CV: distant heart tones obscured by lungs  Abdomen: Soft, nontender, no hepatosplenomegaly, no distention.  Neurologic: Alert and oriented x 3.  Psych: Normal affect.  Extremities: No clubbing or cyanosis.   ECG: reviewed and available in electronic records.  Dobutamine Cardiolite: IMPRESSION: 1. Abnormal Lexiscan MPI for ischemia  2. Large inferior/inferoseptal infarct with moderate peri-infarct Ischemia.  3. Normal left ventricular systolic function, LVEF 49%  4. Intermediate risk study for major cardiac events based on area of myocardium at jeopardy.      ASSESSMENT AND PLAN: Dizziness  His symptoms began rather acutely roughly 6 weeks ago. Vertebrobasilar insufficiency could be causing his symptoms. MRI of the brain demonstrated chronic microvascular ischemia without acute infarct. No recurrent episodes thus far.  CORONARY HEART DISEASE  No chest pain, but I wonder if his lesions noted 8 years ago have progressed given his new symptoms of dizziness. He did have a proximal RCA stenosis of 60% at that time. Dobutamine Cardiolite stress test demonstrated a large inferior/inferoseptal infarct with moderate peri-infarct ischemia. I will proceed with coronary angiography.  C O P D  Followed on a regular basis by Dr. Joya Gaskins. On reduced dose of carvedilol, patient tolerating well.   HYPERTENSION  Blood pressure mildly elevated after discontinuation of amlodipine and reduced dose of carvedilol. However, this is acceptable given his age as per recent guidelines.   Dispo: f/u after cath.  Kate Sable, M.D.,  F.A.C.C.

## 2014-04-15 NOTE — Telephone Encounter (Signed)
Left heart cath - 10:30 - Thursday, 5/28 - Irish Lack  Checking percert

## 2014-04-15 NOTE — Telephone Encounter (Signed)
APPROVED.  See "Auth/Cert".

## 2014-04-15 NOTE — Patient Instructions (Signed)
Your physician has requested that you have a cardiac catheterization. Cardiac catheterization is used to diagnose and/or treat various heart conditions. Doctors may recommend this procedure for a number of different reasons. The most common reason is to evaluate chest pain. Chest pain can be a symptom of coronary artery disease (CAD), and cardiac catheterization can show whether plaque is narrowing or blocking your heart's arteries. This procedure is also used to evaluate the valves, as well as measure the blood flow and oxygen levels in different parts of your heart. For further information please visit [email or web address]. Please follow instruction sheet, as given. Continue all current medications. Follow up will be given after procedure above

## 2014-04-16 ENCOUNTER — Encounter (HOSPITAL_COMMUNITY)
Admission: RE | Disposition: A | Discharge status: Home / Self Care | Payer: Medicare HMO | Source: Ambulatory Visit | Attending: Interventional Cardiology

## 2014-04-16 ENCOUNTER — Encounter (HOSPITAL_COMMUNITY): Payer: Self-pay | Admitting: General Practice

## 2014-04-16 ENCOUNTER — Other Ambulatory Visit: Payer: Self-pay

## 2014-04-16 ENCOUNTER — Ambulatory Visit (HOSPITAL_COMMUNITY)
Admission: RE | Admit: 2014-04-16 | Discharge: 2014-04-17 | Disposition: A | Discharge status: Home / Self Care | Payer: Medicare HMO | Source: Ambulatory Visit | Attending: Interventional Cardiology | Admitting: Interventional Cardiology

## 2014-04-16 DIAGNOSIS — J9610 Chronic respiratory failure, unspecified whether with hypoxia or hypercapnia: Secondary | ICD-10-CM | POA: Insufficient documentation

## 2014-04-16 DIAGNOSIS — R0602 Shortness of breath: Secondary | ICD-10-CM

## 2014-04-16 DIAGNOSIS — J449 Chronic obstructive pulmonary disease, unspecified: Secondary | ICD-10-CM

## 2014-04-16 DIAGNOSIS — I2510 Atherosclerotic heart disease of native coronary artery without angina pectoris: Secondary | ICD-10-CM | POA: Insufficient documentation

## 2014-04-16 DIAGNOSIS — R931 Abnormal findings on diagnostic imaging of heart and coronary circulation: Secondary | ICD-10-CM

## 2014-04-16 DIAGNOSIS — Z7982 Long term (current) use of aspirin: Secondary | ICD-10-CM | POA: Insufficient documentation

## 2014-04-16 DIAGNOSIS — Q120 Congenital cataract: Secondary | ICD-10-CM | POA: Insufficient documentation

## 2014-04-16 DIAGNOSIS — J438 Other emphysema: Secondary | ICD-10-CM | POA: Insufficient documentation

## 2014-04-16 DIAGNOSIS — E039 Hypothyroidism, unspecified: Secondary | ICD-10-CM | POA: Insufficient documentation

## 2014-04-16 DIAGNOSIS — I10 Essential (primary) hypertension: Secondary | ICD-10-CM | POA: Insufficient documentation

## 2014-04-16 DIAGNOSIS — I70219 Atherosclerosis of native arteries of extremities with intermittent claudication, unspecified extremity: Secondary | ICD-10-CM

## 2014-04-16 DIAGNOSIS — Z87891 Personal history of nicotine dependence: Secondary | ICD-10-CM | POA: Insufficient documentation

## 2014-04-16 DIAGNOSIS — Z8551 Personal history of malignant neoplasm of bladder: Secondary | ICD-10-CM | POA: Insufficient documentation

## 2014-04-16 DIAGNOSIS — Z9981 Dependence on supplemental oxygen: Secondary | ICD-10-CM | POA: Insufficient documentation

## 2014-04-16 DIAGNOSIS — R55 Syncope and collapse: Secondary | ICD-10-CM

## 2014-04-16 DIAGNOSIS — I200 Unstable angina: Secondary | ICD-10-CM | POA: Insufficient documentation

## 2014-04-16 DIAGNOSIS — R9430 Abnormal result of cardiovascular function study, unspecified: Secondary | ICD-10-CM | POA: Diagnosis present

## 2014-04-16 DIAGNOSIS — E785 Hyperlipidemia, unspecified: Secondary | ICD-10-CM | POA: Insufficient documentation

## 2014-04-16 HISTORY — DX: Hyperlipidemia, unspecified: E78.5

## 2014-04-16 HISTORY — DX: Essential (primary) hypertension: I10

## 2014-04-16 HISTORY — DX: Anemia, unspecified: D64.9

## 2014-04-16 HISTORY — DX: Atherosclerotic heart disease of native coronary artery without angina pectoris: I25.10

## 2014-04-16 HISTORY — PX: PERCUTANEOUS CORONARY STENT INTERVENTION (PCI-S): SHX5485

## 2014-04-16 HISTORY — PX: CORONARY ANGIOPLASTY WITH STENT PLACEMENT: SHX49

## 2014-04-16 HISTORY — DX: Personal history of other diseases of the digestive system: Z87.19

## 2014-04-16 HISTORY — DX: Emphysema, unspecified: J43.9

## 2014-04-16 HISTORY — PX: LEFT HEART CATHETERIZATION WITH CORONARY ANGIOGRAM: SHX5451

## 2014-04-16 HISTORY — DX: Congenital cataract: Q12.0

## 2014-04-16 LAB — BASIC METABOLIC PANEL
BUN: 9 mg/dL (ref 6–23)
CO2: 26 mEq/L (ref 19–32)
Calcium: 9.3 mg/dL (ref 8.4–10.5)
Chloride: 97 mEq/L (ref 96–112)
Creatinine, Ser: 0.83 mg/dL (ref 0.50–1.35)
GFR calc Af Amer: >90 mL/min (ref >90)
GFR calc non Af Amer: 80 mL/min — ABNORMAL LOW (ref >90)
GLUCOSE: 79 mg/dL (ref 70–99)
POTASSIUM: 3.7 meq/L (ref 3.7–5.3)
Sodium: 136 mEq/L — ABNORMAL LOW (ref 137–147)

## 2014-04-16 LAB — CBC
HCT: 38.4 % — ABNORMAL LOW (ref 39.0–52.0)
HEMOGLOBIN: 12.4 g/dL — AB (ref 13.0–17.0)
MCH: 30.2 pg (ref 26.0–34.0)
MCHC: 32.3 g/dL (ref 30.0–36.0)
MCV: 93.4 fL (ref 78.0–100.0)
Platelets: 305 10*3/uL (ref 150–400)
RBC: 4.11 MIL/uL — AB (ref 4.22–5.81)
RDW: 13.4 % (ref 11.5–15.5)
WBC: 9.7 10*3/uL (ref 4.0–10.5)

## 2014-04-16 LAB — PROTIME-INR
INR: 1.01 (ref 0.00–1.49)
Prothrombin Time: 13.1 seconds (ref 11.6–15.2)

## 2014-04-16 LAB — POCT ACTIVATED CLOTTING TIME
ACTIVATED CLOTTING TIME: 260 s
ACTIVATED CLOTTING TIME: 326 s

## 2014-04-16 LAB — PLATELET COUNT: PLATELETS: 266 10*3/uL (ref 150–400)

## 2014-04-16 SURGERY — LEFT HEART CATHETERIZATION WITH CORONARY ANGIOGRAM
Anesthesia: LOCAL

## 2014-04-16 MED ORDER — MIDAZOLAM HCL 2 MG/2ML IJ SOLN
INTRAMUSCULAR | Status: AC
  Filled 2014-04-16: qty 2

## 2014-04-16 MED ORDER — FENTANYL CITRATE 0.05 MG/ML IJ SOLN
INTRAMUSCULAR | Status: AC
  Filled 2014-04-16: qty 2

## 2014-04-16 MED ORDER — MOMETASONE FURO-FORMOTEROL FUM 200-5 MCG/ACT IN AERO
2.0000 | INHALATION_SPRAY | Freq: Two times a day (BID) | RESPIRATORY_TRACT | Status: DC
  Administered 2014-04-17: 2 via RESPIRATORY_TRACT
  Filled 2014-04-16: qty 8.8

## 2014-04-16 MED ORDER — SIMVASTATIN 20 MG PO TABS
20.0000 mg | ORAL_TABLET | Freq: Every day | ORAL | Status: DC
  Administered 2014-04-16: 20:00:00 20 mg via ORAL
  Filled 2014-04-16 (×2): qty 1

## 2014-04-16 MED ORDER — SODIUM CHLORIDE 0.9 % IJ SOLN: 3.0000 mL | INTRAMUSCULAR | Status: DC | PRN

## 2014-04-16 MED ORDER — HEPARIN SODIUM (PORCINE) 1000 UNIT/ML IJ SOLN
INTRAMUSCULAR | Status: AC
  Filled 2014-04-16: qty 1

## 2014-04-16 MED ORDER — ALBUTEROL SULFATE (2.5 MG/3ML) 0.083% IN NEBU: 3.0000 mL | INHALATION_SOLUTION | RESPIRATORY_TRACT | Status: DC | PRN

## 2014-04-16 MED ORDER — LIDOCAINE HCL (PF) 1 % IJ SOLN
INTRAMUSCULAR | Status: AC
  Filled 2014-04-16: qty 30

## 2014-04-16 MED ORDER — ASPIRIN 81 MG PO CHEW: 81.0000 mg | CHEWABLE_TABLET | ORAL | Status: DC

## 2014-04-16 MED ORDER — TIROFIBAN HCL IV 5 MG/100ML
0.1500 ug/kg/min | INTRAVENOUS | Status: AC
  Filled 2014-04-16: qty 100

## 2014-04-16 MED ORDER — TIROFIBAN HCL IV 5 MG/100ML
INTRAVENOUS | Status: AC
  Filled 2014-04-16: qty 100

## 2014-04-16 MED ORDER — HEPARIN (PORCINE) IN NACL 2-0.9 UNIT/ML-% IJ SOLN
INTRAMUSCULAR | Status: AC
  Filled 2014-04-16: qty 1500

## 2014-04-16 MED ORDER — ASPIRIN 81 MG PO TABS: 81.0000 mg | ORAL_TABLET | Freq: Every day | ORAL | Status: DC

## 2014-04-16 MED ORDER — VERAPAMIL HCL 2.5 MG/ML IV SOLN
INTRAVENOUS | Status: AC
  Filled 2014-04-16: qty 2

## 2014-04-16 MED ORDER — SODIUM CHLORIDE 0.9 % IJ SOLN: 3.0000 mL | Freq: Two times a day (BID) | INTRAMUSCULAR | Status: DC

## 2014-04-16 MED ORDER — NITROGLYCERIN 0.2 MG/ML ON CALL CATH LAB
INTRAVENOUS | Status: AC
  Filled 2014-04-16: qty 1

## 2014-04-16 MED ORDER — SODIUM CHLORIDE 0.9 % IV SOLN: 250.0000 mL | INTRAVENOUS | Status: DC | PRN

## 2014-04-16 MED ORDER — GUAIFENESIN ER 600 MG PO TB12
600.0000 mg | ORAL_TABLET | Freq: Two times a day (BID) | ORAL | Status: DC | PRN
  Filled 2014-04-16: qty 1

## 2014-04-16 MED ORDER — CLOPIDOGREL BISULFATE 300 MG PO TABS
ORAL_TABLET | ORAL | Status: AC
  Filled 2014-04-16: qty 2

## 2014-04-16 MED ORDER — CARVEDILOL 3.125 MG PO TABS
3.1250 mg | ORAL_TABLET | Freq: Two times a day (BID) | ORAL | Status: DC
  Administered 2014-04-16 – 2014-04-17 (×2): 3.125 mg via ORAL
  Filled 2014-04-16 (×4): qty 1

## 2014-04-16 MED ORDER — CLOPIDOGREL BISULFATE 75 MG PO TABS
75.0000 mg | ORAL_TABLET | Freq: Every day | ORAL | Status: DC
  Administered 2014-04-17: 75 mg via ORAL
  Filled 2014-04-16: qty 1

## 2014-04-16 MED ORDER — ASPIRIN 81 MG PO CHEW
81.0000 mg | CHEWABLE_TABLET | Freq: Every day | ORAL | Status: DC
  Administered 2014-04-17: 09:00:00 81 mg via ORAL
  Filled 2014-04-16: qty 1

## 2014-04-16 MED ORDER — LEVOTHYROXINE SODIUM 88 MCG PO TABS
88.0000 ug | ORAL_TABLET | Freq: Every day | ORAL | Status: DC
  Administered 2014-04-17: 06:00:00 88 ug via ORAL
  Filled 2014-04-16 (×2): qty 1

## 2014-04-16 NOTE — Interval H&P Note (Signed)
Cath Lab Visit (complete for each Cath Lab visit)  Clinical Evaluation Leading to the Procedure:   ACS: no  Non-ACS:    Anginal Classification: CCS III  Anti-ischemic medical therapy: Minimal Therapy (1 class of medications)  Non-Invasive Test Results: Intermediate-risk stress test findings: cardiac mortality 1-3%/year  Prior CABG: No previous CABG      History and Physical Interval Note:  04/16/2014 10:28 AM  Willie Williamson  has presented today for surgery, with the diagnosis of abnormal stress test  The various methods of treatment have been discussed with the patient and family. After consideration of risks, benefits and other options for treatment, the patient has consented to  Procedure(s): LEFT HEART CATHETERIZATION WITH CORONARY ANGIOGRAM (N/A) as a surgical intervention .  The patient's history has been reviewed, patient examined, no change in status, stable for surgery.  I have reviewed the patient's chart and labs.  Questions were answered to the patient's satisfaction.     Willie Williamson

## 2014-04-16 NOTE — Progress Notes (Signed)
TR BAND REMOVAL  LOCATION:    right radial  DEFLATED PER PROTOCOL:    yes  TIME BAND OFF / DRESSING APPLIED:    1600   SITE UPON ARRIVAL:    Level 0  SITE AFTER BAND REMOVAL:    Level 0  REVERSE ALLEN'S TEST:     positive  CIRCULATION SENSATION AND MOVEMENT:    Within Normal Limits   yes  COMMENTS:   Gauze dressing applied at 1600, Rechecked site at 1630 with no change in assessment, dressing remains dry and intact

## 2014-04-16 NOTE — CV Procedure (Addendum)
PROCEDURE:  Left heart catheterization with selective coronary angiography, left ventriculogram. IVUS of the RCA, PCI of the RCA, right upper extremity angiogram  INDICATIONS:  Abnormal stress test, class III anginal equivalent  The risks, benefits, and details of the procedure were explained to the patient.  The patient verbalized understanding and wanted to proceed.  Informed written consent was obtained.  PROCEDURE TECHNIQUE:  After Xylocaine anesthesia a 80F slender sheath was placed in the right radial artery with a single anterior needle wall stick. There is difficulty navigating up into the brachial artery. Angiogram was done through the sheath which revealed some apparent spasm. The first core wire could not be advanced into the brachial artery. The RCA catheter was advanced to the proximal radial artery. Hand-injection of contrast was performed showing tortuosity and spasm. Nitroglycerin was given through the RCA catheter. The Glidewire was then successfully passed through the vessel.  Right coronary angiography was done using a Judkins R4 guide catheter.  Left coronary angiography was done using a Judkins L3.5 guide catheter.  Left ventriculography was done using a pigtail catheter. The intervention was then performed.  A TR band was used for hemostasis.   CONTRAST:  Total of 150 cc.  COMPLICATIONS:  None.    HEMODYNAMICS:  Aortic pressure was 92/55; LV pressure was 91/2; LVEDP 8.  There was no gradient between the left ventricle and aorta.    ANGIOGRAPHIC DATA:   The left main coronary artery is widely patent.  The left anterior descending artery is a large vessel which wraps around the apex.  There is a small first diagonal which is patent. The second diagonal is medium size and widely patent. There is calcification in the proximal LAD without significant stenosis. There is no hemodynamic be significant disease in the mid to distal LAD.  The left circumflex artery is a large  vessel. There is mild calcification proximally. There is a large first obtuse marginal which is widely patent. The remainder of the circumflex is medium size and widely patent.  The right coronary artery is a large, dominant vessel with tortuosity in the midportion. The posterior lateral artery is large. The posterior descending artery is large and widely patent. There is diffuse calcification throughout the vessel. In the proximal vessel, there is a hazy 60-70% stenosis. There is heavy calcification distally without any apparent stenosis.  Right upper extremity angiogram: Tortuosity and spasm noted in the proximal right radial artery. This required using a Glidewire.  LEFT VENTRICULOGRAM:  Left ventricular angiogram was done in the 30 RAO projection and revealed normal left ventricular wall motion and systolic function with an estimated ejection fraction of 60%.  LVEDP was 8 mmHg.  PCI NARRATIVE: A JR 4 guiding catheter was used to engage the RCA. IV heparin was given for anticoagulation. An ACT was used to check that the anticoagulation was therapeutic. A pro-water wire was placed across the area disease. An IVUS catheter was advanced and the test performed. There was a cross-sectional area of 2.4 mm square in the proximal RCA at the area in question. The decision to intervene was made. A 2.0 x 10 angiosculpt balloon was used to predilate although this did not seem to cross the entire lesion. A 2.5 x 15 balloon was used to predilate. A 2.75 x 15 Xience stent would not cross the area of disease.  A 3.0 x 15 balloon was then used to predilate. A BMW wire was also placed as a buddy wire.  The stent was then again placed and deployed at high pressure. The stent was post dilated with a 3.25 x 12 noncompliant balloon. There was an excellent angiographic result. There was pseudo-lesion just distal to the stent which resolved with pulling the wire back. Due to the calcification and significant tortuosity as well  as the patient having some mild hypertension, I elected not to repeat intravascular ultrasound.  Several doses of intra-coronary nitroglycerin were administered.  IMPRESSIONS:  1. Normal left main coronary artery. 2. Nonobstructive disease in the left anterior descending artery and its branches. 3. Nonobstructive disease in the left circumflex artery and its branches. 4. 60-70% hazy proximal right coronary artery lesion. This was significant by intravascular ultrasound of the cross-sectional area of 2.4 mm. Due to severe COPD, we do not want to use adenosine for a pressure wire. The RCA lesion was stented with a 2.75 x 15 Xience drug-eluting stent, postdilated to 3.4 mm in diameter. 5. Normal left ventricular systolic function.  LVEDP 8 mmHg.  Ejection fraction 60 %.  RECOMMENDATION:  Continue dual antiplatelet therapy for at least a year. If an urgent catheterization was done, would be cautious about using the right radial approach due to the tortuosity. Would likely need a Glidewire to navigate the radial.

## 2014-04-16 NOTE — H&P (View-Only) (Signed)
Patient ID: EPIC TRIBBETT, male   DOB: [birth year], 80 y.o.   MRN: [id number]      SUBJECTIVE: Willie Williamson is an 80 year old male with a history of COPD, bladder cancer, nonobstructive coronary disease with normal LV function. He has severe dyspnea secondary to COPD/emphysema and is followed closely by Dr. Lyda Jester.  He was hospitalized in late April for a COPD exacerbation and orthostatic hypotension. He was found to be dehydrated and pre-syncopal, and amlodipine was discontinued and carvedilol dosage was reduced. BNP and troponins were normal at that time.  He underwent an echocardiogram on 03/14/2014 which demonstrated normal left ventricular systolic function, EF 74-08%, normal regional wall motion, and grade 1 diastolic dysfunction.   Approximately 6 weeks ago, he began feeling acutely dizzy. He had several near syncopal episodes and now has a walker. He denied chest pain at that time. I reviewed the results of his cardiac catheterization performed on 10/15/2006. At that time he had several nonobstructive lesions of 25-30%. He did have a proximal RCA stenosis of 60%. He denies any unilateral weakness. He also denies headaches.  He chronically uses 2 L of oxygen.   Brain MRI demonstrated chronic microvascular ischemia without acute infarct. Dobutamine Cardiolite stress test demonstrated a large inferior/inferoseptal infarct with moderate peri-infarct ischemia. I will proceed with coronary angiography.  He again denies chest pain today, but believes he is having "a flareup of COPD".   Allergies  Allergen Reactions  . Penicillins     REACTION: rash    Current Outpatient Prescriptions  Medication Sig Dispense Refill  . albuterol (PROVENTIL HFA;VENTOLIN HFA) 108 (90 BASE) MCG/ACT inhaler Inhale 2 puffs into the lungs every 4 (four) hours as needed.  1 Inhaler  6  . aspirin 81 MG tablet Take 81 mg by mouth daily.        . carvedilol (COREG) 3.125 MG tablet Take 1 tablet (3.125 mg total) by  mouth 2 (two) times daily with a meal.  60 tablet  0  . fluticasone (FLONASE) 50 MCG/ACT nasal spray Place 2 sprays into both nostrils daily as needed.      . Fluticasone-Salmeterol (ADVAIR DISKUS) 500-50 MCG/DOSE AEPB INHALE 1 PUFF TWICE A DAY  60 each  11  . GuaiFENesin (MUCINEX PO) Take 400 mg by mouth 3 (three) times daily as needed (for congestion).       Marland Kitchen levothyroxine (SYNTHROID, LEVOTHROID) 88 MCG tablet Take 88 mcg by mouth daily before breakfast.      . loratadine (CLARITIN) 10 MG tablet Take 10 mg by mouth daily as needed for allergies.      Marland Kitchen lovastatin (MEVACOR) 20 MG tablet Take 20 mg by mouth at bedtime.        . OXYGEN-HELIUM IN Inhale 2 L into the lungs continuous as needed.      Marland Kitchen scopolamine (HYOSCINE) 0.25 % ophthalmic solution Place 1 drop into the left eye as needed.       . timolol (BETIMOL) 0.5 % ophthalmic solution Place 1 drop into the left eye as needed.       No current facility-administered medications for this visit.    Past Medical History  Diagnosis Date  . Unspecified essential hypertension   . Shortness of breath   . Acute bronchitis   . Other acute sinusitis   . Malignant neoplasm of bladder, part unspecified   . Other and unspecified hyperlipidemia   . Coronary atherosclerosis of unspecified type of vessel, native or graft   . Other  emphysema   . Chronic airway obstruction, not elsewhere classified   . Hypothyroidism     Past Surgical History  Procedure Laterality Date  . Turp vaporization    . Bladder surgery      History   Social History  . Marital Status: Married    Spouse Name: N/A    Number of Children: N/A  . Years of Education: N/A   Occupational History  . retired    Social History Main Topics  . Smoking status: Former Smoker -- 1.50 packs/day for 48 years    Types: Cigarettes    Quit date: 11/20/1996  . Smokeless tobacco: Never Used  . Alcohol Use: Not on file  . Drug Use: Not on file  . Sexual Activity: Not on file    Other Topics Concern  . Not on file   Social History Narrative  . No narrative on file     Filed Vitals:   04/15/14 0917  BP: 146/71  Pulse: 108  Height: 5\' 7"  (1.702 m)  Weight: 148 lb (67.132 kg)  SpO2: 96%    PHYSICAL EXAM General: NAD, wearing oxygen  Neck: No JVD, no thyromegaly or thyroid nodule.  Lungs: Distant breath sounds bilaterally  CV: distant heart tones obscured by lungs  Abdomen: Soft, nontender, no hepatosplenomegaly, no distention.  Neurologic: Alert and oriented x 3.  Psych: Normal affect.  Extremities: No clubbing or cyanosis.   ECG: reviewed and available in electronic records.  Dobutamine Cardiolite: IMPRESSION: 1. Abnormal Lexiscan MPI for ischemia  2. Large inferior/inferoseptal infarct with moderate peri-infarct Ischemia.  3. Normal left ventricular systolic function, LVEF 49%  4. Intermediate risk study for major cardiac events based on area of myocardium at jeopardy.      ASSESSMENT AND PLAN: Dizziness  His symptoms began rather acutely roughly 6 weeks ago. Vertebrobasilar insufficiency could be causing his symptoms. MRI of the brain demonstrated chronic microvascular ischemia without acute infarct. No recurrent episodes thus far.  CORONARY HEART DISEASE  No chest pain, but I wonder if his lesions noted 8 years ago have progressed given his new symptoms of dizziness. He did have a proximal RCA stenosis of 60% at that time. Dobutamine Cardiolite stress test demonstrated a large inferior/inferoseptal infarct with moderate peri-infarct ischemia. I will proceed with coronary angiography.  C O P D  Followed on a regular basis by Dr. Joya Gaskins. On reduced dose of carvedilol, patient tolerating well.   HYPERTENSION  Blood pressure mildly elevated after discontinuation of amlodipine and reduced dose of carvedilol. However, this is acceptable given his age as per recent guidelines.   Dispo: f/u after cath.  Kate Sable, M.D.,  F.A.C.C.

## 2014-04-17 ENCOUNTER — Encounter (HOSPITAL_COMMUNITY): Payer: Self-pay | Admitting: Physician Assistant

## 2014-04-17 DIAGNOSIS — R9439 Abnormal result of other cardiovascular function study: Secondary | ICD-10-CM

## 2014-04-17 DIAGNOSIS — I200 Unstable angina: Secondary | ICD-10-CM | POA: Diagnosis present

## 2014-04-17 DIAGNOSIS — J449 Chronic obstructive pulmonary disease, unspecified: Secondary | ICD-10-CM

## 2014-04-17 DIAGNOSIS — I2510 Atherosclerotic heart disease of native coronary artery without angina pectoris: Secondary | ICD-10-CM

## 2014-04-17 LAB — CBC
HEMATOCRIT: 32.2 % — AB (ref 39.0–52.0)
HEMOGLOBIN: 10.6 g/dL — AB (ref 13.0–17.0)
MCH: 30.4 pg (ref 26.0–34.0)
MCHC: 32.9 g/dL (ref 30.0–36.0)
MCV: 92.3 fL (ref 78.0–100.0)
Platelets: 250 10*3/uL (ref 150–400)
RBC: 3.49 MIL/uL — AB (ref 4.22–5.81)
RDW: 13.7 % (ref 11.5–15.5)
WBC: 7.6 10*3/uL (ref 4.0–10.5)

## 2014-04-17 LAB — BASIC METABOLIC PANEL
BUN: 8 mg/dL (ref 6–23)
CHLORIDE: 100 meq/L (ref 96–112)
CO2: 26 mEq/L (ref 19–32)
Calcium: 8.6 mg/dL (ref 8.4–10.5)
Creatinine, Ser: 0.84 mg/dL (ref 0.50–1.35)
GFR calc Af Amer: >90 mL/min (ref >90)
GFR, EST NON AFRICAN AMERICAN: 80 mL/min — AB (ref >90)
Glucose, Bld: 93 mg/dL (ref 70–99)
Potassium: 3.9 mEq/L (ref 3.7–5.3)
SODIUM: 136 meq/L — AB (ref 137–147)

## 2014-04-17 MED ORDER — NITROGLYCERIN 0.4 MG SL SUBL: 0.4000 mg | SUBLINGUAL_TABLET | SUBLINGUAL | Status: DC | PRN

## 2014-04-17 MED ORDER — CLOPIDOGREL BISULFATE 75 MG PO TABS: 75.0000 mg | ORAL_TABLET | Freq: Every day | ORAL | Status: DC

## 2014-04-17 NOTE — Progress Notes (Signed)
CARDIAC REHAB PHASE I   PRE:  Rate/Rhythm: 95SR  BP:  Supine:   Sitting: 156/61  Standing:    SaO2: 98%2L  MODE:  Ambulation: 275 ft   POST:  Rate/Rhythm: 126 ST  To 101 with rest  BP:  Supine:   Sitting: 170/59  Standing:    SaO2: 91-93% 2L 0750-0840 Pt walked 275 ft on 2L with rolling walker and asst x 1 with steady gait. Stopped several times to get this breath. Denied dizziness or CP. To recliner after walk. Pt has home oxygen, walker and cane. Encouraged walking as tolerated. Pt has hallway of 70 ft that he has been trying to walk, increasing as he feels he can. Does not walk outside unless with someone. Discussed with pt that lifeline device might be good for him since he is alone at times. He stated that he had been thinking about getting one and will look into it more. Reviewed stent and plavix. Discussed CRP2 and pt felt would be too much for him. Reviewed NTG use which he could not recall with teachback so reviewed again. Encouraged watching salt in diet.   Graylon Good, RN BSN  04/17/2014 8:37 AM

## 2014-04-17 NOTE — Progress Notes (Signed)
     SUBJECTIVE: No chest pain, no SOB.   BP 134/79  Pulse 81  Temp(Src) 98.9 F (37.2 C) (Oral)  Resp 17  Ht 5\' 7"  (1.702 m)  Wt 151 lb 0.2 oz (68.5 kg)  BMI 23.65 kg/m2  SpO2 96%  Intake/Output Summary (Last 24 hours) at 04/17/14 0640 Last data filed at 04/17/14 7096  Gross per 24 hour  Intake  682.5 ml  Output    800 ml  Net -117.5 ml    PHYSICAL EXAM General: Well developed, well nourished, in no acute distress. Alert and oriented x 3.  Psych:  Good affect, responds appropriately Neck: No JVD. No masses noted.  Lungs: Clear bilaterally with no wheezes or rhonci noted.  Heart: RRR with no murmurs noted. Abdomen: Bowel sounds are present. Soft, non-tender.  Extremities: No lower extremity edema.   LABS: Basic Metabolic Panel:  Recent Labs  04/16/14 0909 04/17/14 0435  NA 136* 136*  K 3.7 3.9  CL 97 100  CO2 26 26  GLUCOSE 79 93  BUN 9 8  CREATININE 0.83 0.84  CALCIUM 9.3 8.6   CBC:  Recent Labs  04/16/14 0909 04/16/14 1919 04/17/14 0435  WBC 9.7  --  7.6  HGB 12.4*  --  10.6*  HCT 38.4*  --  32.2*  MCV 93.4  --  92.3  PLT 305 266 250   Current Meds: . aspirin  81 mg Oral Daily  . carvedilol  3.125 mg Oral BID WC  . clopidogrel  75 mg Oral Q breakfast  . levothyroxine  88 mcg Oral QAC breakfast  . mometasone-formoterol  2 puff Inhalation BID  . simvastatin  20 mg Oral q1800   ASSESSMENT AND PLAN:  1. Unstable angina/CAD: Pt admitted after PCI of RCA. Severe stenosis in the proximal RCA, now s/p DES in 1 in the proximal RCA. Doing well. BP stable. Sinus on tele. He will need dual anti-platelet therapy with ASA and Plavix for at least one year. Continue statin and beta blocker.  Discharge home today and follow up with Dr. Bronson Ing in 2-3 weeks.   2. COPD: Continue home meds  3. HTN: BP controlled. No changes in therapy.    Annita Brod Beatris Belen  5/29/20156:40 AM

## 2014-04-17 NOTE — Discharge Summary (Signed)
See full note this am. cdm 

## 2014-04-17 NOTE — Discharge Summary (Signed)
Discharge Summary   Patient ID: Willie Williamson MRN: [id number], DOB/AGE: 78-Apr-1932 78 y.o. Admit date: 04/16/2014 D/C date:     04/17/2014  Primary Care Provider: [name], MD Primary Cardiologist: Willie Williamson - usually in [place] but pt's post-hospital visit will be in [place] due to appt availability  Primary Discharge Diagnoses:  1. Unstable angina/CAD - s/p DES to RCA, nonobstructive disease in LAD/Cx, EF 60% 2. COPD with chronic respiratory failure on 2L O2 3. HTN  Secondary Discharge Diagnoses:  1. Anemia, appears chronic 2. Bladder CA 3. Hyperlipidemia 4. Hypothyroidism 5. Congenital cataract 6. H/o hiatal hernia 7. H/o orthostatic hypotension   Hospital Course: Willie Williamson is an 80 year old male with a history of COPD with severe dyspnea on home O2, bladder cancer, previously nonobstructive CAD, and orthostatic hypotension who presented to [facility] for cardiac cath following abnormal nuc. With regard to recent history, he was hospitalized in late April for a COPD exacerbation and orthostatic hypotension. He was found to be dehydrated and pre-syncopal, and amlodipine was discontinued and carvedilol dosage was reduced. BNP and troponins were normal at that time. He underwent an echocardiogram on 03/14/2014 which demonstrated normal left ventricular systolic function, EF 25-36%, normal regional wall motion, and grade 1 diastolic dysfunction. Approximately 6 weeks ago, he began feeling acutely dizzy. He had several near syncopal episodes and now has a walker. He denied chest pain at that time. Brain MRI demonstrated chronic microvascular ischemia without acute infarct. It was felt that perhaps he had some vertebrobasilar insufficiency. Dobutamine Cardiolite stress test demonstrated a large inferior/inferoseptal infarct with moderate peri-infarct ischemia. Dr. Bronson Williamson arranged for cardiac catheterization. The patient was brought in for this procedure yesterday which  demonstrated: 1. Normal left main coronary artery. 2. Nonobstructive disease in the left anterior descending artery and its branches. 3. Nonobstructive disease in the left circumflex artery and its branches. 4. 60-70% hazy proximal right coronary artery lesion. This was significant by intravascular ultrasound of the cross-sectional area of 2.4 mm. Due to severe COPD, we do not want to use adenosine for a pressure wire. The RCA lesion was stented with a 2.75 x 15 Xience drug-eluting stent, postdilated to 3.4 mm in diameter. 5. Normal left ventricular systolic function. LVEDP 8 mmHg. Ejection fraction 60 %.  Recommendation is to continue DAPT for at least 1 yr. Please note "If an urgent catheterization was done, would be cautious about using the right radial approach due to the tortuosity. Would likely need a Glidewire to navigate the radial." Post procedurally his Hgb was down slightly from admission but this was post PCI/pre-cath fluids and stable from values in April. There was no reported bleeding. He was instructed to f/u PCP for this. The patient tolerated the procedure without acute complication. He feels well today. Dr. Angelena Williamson has seen and examined the patient today and feels he is stable for discharge. Due to appt availability, the schedulers have placed him on the [place] schedule and he was informed of the office location change.  Discharge Vitals: Blood pressure 134/79, pulse 81, temperature 98.5 F (36.9 C), temperature source Oral, resp. rate 16, height 5\' 7"  (1.702 m), weight 151 lb 0.2 oz (68.5 kg), SpO2 98.00%.  Labs: Lab Results  Component Value Date   WBC 7.6 04/17/2014   HGB 10.6* 04/17/2014   HCT 32.2* 04/17/2014   MCV 92.3 04/17/2014   PLT 250 04/17/2014     Recent Labs Lab 04/17/14 0435  NA 136*  K 3.9  CL 100  CO2 26  BUN 8  CREATININE 0.84  CALCIUM 8.6  GLUCOSE 93    Diagnostic Studies/Procedures   Cath 04/16/14 PROCEDURE: Left heart catheterization with  selective coronary angiography, left ventriculogram. I this of the RCA, PCI of the RCA, right upper extremity angiogram  INDICATIONS: Abnormal stress test, class III anginal equivalent  The risks, benefits, and details of the procedure were explained to the patient. The patient verbalized understanding and wanted to proceed. Informed written consent was obtained.  PROCEDURE TECHNIQUE: After Xylocaine anesthesia a 83F slender sheath was placed in the right radial artery with a single anterior needle wall stick. There is difficulty navigating up into the brachial artery. Angiogram was done through the sheath which revealed some apparent spasm. The first core wire could not be advanced into the brachial artery. The RCA catheter was advanced to the proximal radial artery. Hand-injection of contrast was performed showing tortuosity and spasm. Nitroglycerin was given through the RCA catheter. The Glidewire was then successfully passed through the vessel. Right coronary angiography was done using a Judkins R4 guide catheter. Left coronary angiography was done using a Judkins L3.5 guide catheter. Left ventriculography was done using a pigtail catheter. The intervention was then performed. A TR band was used for hemostasis.  CONTRAST: Total of 150 cc.  COMPLICATIONS: None.  HEMODYNAMICS: Aortic pressure was 92/55; LV pressure was 91/2; LVEDP 8. There was no gradient between the left ventricle and aorta.  ANGIOGRAPHIC DATA: The left main coronary artery is widely patent.  The left anterior descending artery is a large vessel which wraps around the apex. There is a small first diagonal which is patent. The second diagonal is medium size and widely patent. There is calcification in the proximal LAD without significant stenosis. There is no hemodynamic be significant disease in the mid to distal LAD.  The left circumflex artery is a large vessel. There is mild calcification proximally. There is a large first obtuse  marginal which is widely patent. The remainder of the circumflex is medium size and widely patent.  The right coronary artery is a large, dominant vessel with tortuosity in the midportion. The posterior lateral artery is large. The posterior descending artery is large and widely patent. There is diffuse calcification throughout the vessel. In the proximal vessel, there is a hazy 60-70% stenosis. There is heavy calcification distally without any apparent stenosis.  Right upper extremity angiogram: Tortuosity and spasm noted in the proximal right radial artery. This required using a Glidewire.  LEFT VENTRICULOGRAM: Left ventricular angiogram was done in the 30 RAO projection and revealed normal left ventricular wall motion and systolic function with an estimated ejection fraction of 60%. LVEDP was 8 mmHg.  PCI NARRATIVE: A JR 4 guiding catheter was used to engage the RCA. IV heparin was given for anticoagulation. An ACT was used to check that the anticoagulation was therapeutic. A pro-water wire was placed across the area disease. An IVUS catheter was advanced and the test performed. There was a cross-sectional area of 2.4 mm square in the proximal RCA at the area in question. The decision to intervene was made. A 2.0 x 10 angiosculpt balloon was used to predilate although this did not seem to cross the entire lesion. A 2.5 x 15 balloon was used to predilate. A 2.75 x 15 Xience stent would not cross the area of disease. A 3.0 x 15 balloon was then used to predilate. A BMW wire was also placed as a buddy wire. The stent was then  again placed and deployed at high pressure. The stent was post dilated with a 3.25 x 12 noncompliant balloon. There was an excellent angiographic result. There was pseudo-lesion just distal to the stent which resolved with pulling the wire back. Due to the calcification and significant tortuosity as well as the patient having some mild hypertension, I elected not to repeat intravascular  ultrasound. Several doses of intra-coronary nitroglycerin were administered.  IMPRESSIONS:  6. Normal left main coronary artery. 7. Nonobstructive disease in the left anterior descending artery and its branches. 8. Nonobstructive disease in the left circumflex artery and its branches. 9. 60-70% hazy proximal right coronary artery lesion. This was significant by intravascular ultrasound of the cross-sectional area of 2.4 mm. Due to severe COPD, we do not want to use adenosine for a pressure wire. The RCA lesion was stented with a 2.75 x 15 Xience drug-eluting stent, postdilated to 3.4 mm in diameter. 10. Normal left ventricular systolic function. LVEDP 8 mmHg. Ejection fraction 60 %. RECOMMENDATION: Continue dual antiplatelet therapy for at least a year. If an urgent catheterization was done, would be cautious about using the right radial approach due to the tortuosity. Would likely need a Glidewire to navigate the radial.    Discharge Medications   Current Discharge Medication List    START taking these medications   Details  clopidogrel (PLAVIX) 75 MG tablet Take 1 tablet (75 mg total) by mouth daily with breakfast. Qty: 30 tablet, Refills: 11    nitroGLYCERIN (NITROSTAT) 0.4 MG SL tablet Place 1 tablet (0.4 mg total) under the tongue every 5 (five) minutes as needed for chest pain (up to 3 doses). Qty: 25 tablet, Refills: 3      CONTINUE these medications which have NOT CHANGED   Details  albuterol (PROVENTIL HFA;VENTOLIN HFA) 108 (90 BASE) MCG/ACT inhaler Inhale 2 puffs into the lungs every 4 (four) hours as needed for wheezing or shortness of breath.    aspirin 81 MG tablet Take 81 mg by mouth daily.      carvedilol (COREG) 3.125 MG tablet Take 1 tablet (3.125 mg total) by mouth 2 (two) times daily with a meal.     Fluticasone-Salmeterol (ADVAIR) 500-50 MCG/DOSE AEPB Inhale 1 puff into the lungs 2 (two) times daily.    GuaiFENesin (MUCINEX PO) Take 400 mg by mouth 3 (three)  times daily as needed (for congestion).     levothyroxine (SYNTHROID, LEVOTHROID) 88 MCG tablet Take 88 mcg by mouth daily before breakfast.    lovastatin (MEVACOR) 20 MG tablet Take 20 mg by mouth at bedtime.          Disposition   The patient will be discharged in stable condition to home. Discharge Instructions   Diet - low sodium heart healthy    Complete by:  As directed      Increase activity slowly    Complete by:  As directed   No driving for 2 days. No lifting over 5 lbs for 1 week. No sexual activity for 1 week. Keep procedure site clean & dry. If you notice increased pain, swelling, bleeding or pus, call/return!  You may shower, but no soaking baths/hot tubs/pools for 1 week.          Follow-up Information   Follow up with [name], MD On 04/29/2014. (At 9:40am - Dr. Bronson Williamson did not have any available appointments in the [facility] office in the timeframe that you need to follow up, so your after-hospital appointment will be at our  [place] office in [facility].)    Specialty:  Cardiology   Contact information:   [address] [place] [address] [phone]       Follow up with Willie Labrum, MD. (Your bloodwork in the hospital as well as in the past shows that you have a mild anemia. Please follow up with your primary care doctor for further monitoring.)    Contact information:   [place] [place] [address] [phone]         Duration of Discharge Encounter: Greater than 30 minutes including physician and PA time.  Signed, Charlie Pitter PA-C 04/17/2014, 8:24 AM

## 2014-04-22 ENCOUNTER — Other Ambulatory Visit (HOSPITAL_COMMUNITY): Payer: Self-pay | Admitting: Internal Medicine

## 2014-04-24 ENCOUNTER — Encounter: Payer: Self-pay | Admitting: Critical Care Medicine

## 2014-04-24 ENCOUNTER — Ambulatory Visit (INDEPENDENT_AMBULATORY_CARE_PROVIDER_SITE_OTHER): Payer: Medicare HMO | Admitting: Critical Care Medicine

## 2014-04-24 VITALS — BP 138/76 | HR 89 | Ht 67.0 in | Wt 146.0 lb

## 2014-04-24 DIAGNOSIS — J449 Chronic obstructive pulmonary disease, unspecified: Secondary | ICD-10-CM

## 2014-04-24 MED ORDER — FLUTICASONE-SALMETEROL 500-50 MCG/DOSE IN AEPB: 1.0000 | INHALATION_SPRAY | Freq: Two times a day (BID) | RESPIRATORY_TRACT | Status: DC

## 2014-04-24 MED ORDER — ALBUTEROL SULFATE HFA 108 (90 BASE) MCG/ACT IN AERS: 2.0000 | INHALATION_SPRAY | RESPIRATORY_TRACT | Status: DC | PRN

## 2014-04-24 NOTE — Progress Notes (Signed)
Subjective:    Patient ID: Willie Williamson, male    DOB: 12/26/1930, 80 y.o.   MRN: [id number]  HPI  This is a  80 y.o.Marland Kitchen  white male with history of chronic obstructive lung disease.   04/24/2014 Chief Complaint  Patient presents with  . 4 month follow up    Has been treated at least 3 times since last OV by PCP for COPD exacerbations.  Was last seen by PCP x 3 days ago for this - is feeling better now.  Has SOB with activity. Minimal nonprod cough.  No chest tightness/pain.    Pt in hosp once for dizziness, if up moving around.  Then in hosp for RCA stent.  DES  To RCA   Pt required ABX and pred several times since last OV MRI showed small vessel disease 2.5 L exertion 2L rest.  Just finished medrol dose pak, PCP rx.,  Now is better, less cough and congested.  Mucus is white now, off ABX now   Review of Systems  Constitutional:   No  weight loss, night sweats,  Fevers, chills, fatigue, lassitude. HEENT:   No headaches,  Difficulty swallowing,  Tooth/dental problems,  Sore throat,                No sneezing, itching, ear ache, nasal congestion, post nasal drip,   CV:  No chest pain,  Orthopnea, PND, swelling in lower extremities, anasarca, dizziness, palpitations  GI  No heartburn, indigestion, abdominal pain, nausea, vomiting, diarrhea, change in bowel habits, loss of appetite  Resp: Notes  shortness of breath with exertion and  at rest.  No excess mucus, notes  productive cough,  No non-productive cough,  No coughing up of blood.  No change in color of mucus.  No wheezing.  No chest wall deformity  Skin: no rash or lesions.  GU: no dysuria, change in color of urine, no urgency or frequency.  No flank pain.  MS:  No joint pain or swelling.  No decreased range of motion.  No back pain.  Psych:  No change in mood or affect. No depression or anxiety.  No memory loss.     Objective:   Physical Exam  Filed Vitals:   04/24/14 1051  BP: 138/76  Pulse: 89  Height: 5\' 7"  (1.702 m)   Weight: 146 lb (66.225 kg)  SpO2: 97%    Gen: Pleasant, well-nourished, in no distress,  normal affect  ENT: No lesions,  mouth clear,  oropharynx clear, no postnasal drip  Neck: No JVD, no TMG, no carotid bruits  Lungs: No use of accessory muscles, no dullness to percussion, distant BS , . Poor airflow    Cardiovascular: RRR, heart sounds normal, no murmur or gallops, no peripheral edema  Abdomen: soft and NT, no HSM,  BS normal  Musculoskeletal: No deformities, no cyanosis or clubbing  Neuro: alert, non focal  Skin: Warm, no lesions or rashes   No results found.      Assessment & Plan:   C O P D Golds C Gold C Copd with emphysematous changes Plan No change in inhaled or maintenance medications. Return in  4 months    Updated Medication List Outpatient Encounter Prescriptions as of 04/24/2014  Medication Sig  . albuterol (PROVENTIL HFA;VENTOLIN HFA) 108 (90 BASE) MCG/ACT inhaler Inhale 2 puffs into the lungs every 4 (four) hours as needed for wheezing or shortness of breath.  Marland Kitchen aspirin 81 MG tablet Take 81 mg by mouth  daily.    . carvedilol (COREG) 3.125 MG tablet Take 1 tablet (3.125 mg total) by mouth 2 (two) times daily with a meal.  . clopidogrel (PLAVIX) 75 MG tablet Take 1 tablet (75 mg total) by mouth daily with breakfast.  . fluticasone (FLONASE) 50 MCG/ACT nasal spray Place 1 spray into both nostrils 2 (two) times daily as needed.  . Fluticasone-Salmeterol (ADVAIR) 500-50 MCG/DOSE AEPB Inhale 1 puff into the lungs 2 (two) times daily.  . GuaiFENesin (MUCINEX PO) Take 400 mg by mouth 3 (three) times daily as needed (for congestion).   Marland Kitchen levothyroxine (SYNTHROID, LEVOTHROID) 88 MCG tablet Take 88 mcg by mouth daily before breakfast.  . lovastatin (MEVACOR) 20 MG tablet Take 20 mg by mouth at bedtime.    . methylPREDNIsolone (MEDROL DOSPACK) 4 MG tablet Take as directed  . nitroGLYCERIN (NITROSTAT) 0.4 MG SL tablet Place 1 tablet (0.4 mg total) under the  tongue every 5 (five) minutes as needed for chest pain (up to 3 doses).  . [DISCONTINUED] albuterol (PROVENTIL HFA;VENTOLIN HFA) 108 (90 BASE) MCG/ACT inhaler Inhale 2 puffs into the lungs every 4 (four) hours as needed for wheezing or shortness of breath.  . [DISCONTINUED] Fluticasone-Salmeterol (ADVAIR) 500-50 MCG/DOSE AEPB Inhale 1 puff into the lungs 2 (two) times daily.

## 2014-04-24 NOTE — Patient Instructions (Signed)
No change in medications. Return in         4 months 

## 2014-04-24 NOTE — Assessment & Plan Note (Signed)
Gold C Copd with emphysematous changes Plan No change in inhaled or maintenance medications. Return in  4 months

## 2014-04-29 ENCOUNTER — Encounter: Payer: Self-pay | Admitting: Cardiovascular Disease

## 2014-04-29 ENCOUNTER — Ambulatory Visit (INDEPENDENT_AMBULATORY_CARE_PROVIDER_SITE_OTHER): Payer: Medicare HMO | Admitting: Cardiovascular Disease

## 2014-04-29 VITALS — BP 140/70 | HR 73 | Ht 67.0 in | Wt 144.0 lb

## 2014-04-29 DIAGNOSIS — R42 Dizziness and giddiness: Secondary | ICD-10-CM

## 2014-04-29 DIAGNOSIS — Z955 Presence of coronary angioplasty implant and graft: Secondary | ICD-10-CM

## 2014-04-29 DIAGNOSIS — Z9861 Coronary angioplasty status: Secondary | ICD-10-CM

## 2014-04-29 DIAGNOSIS — I2510 Atherosclerotic heart disease of native coronary artery without angina pectoris: Secondary | ICD-10-CM

## 2014-04-29 DIAGNOSIS — J449 Chronic obstructive pulmonary disease, unspecified: Secondary | ICD-10-CM

## 2014-04-29 DIAGNOSIS — I10 Essential (primary) hypertension: Secondary | ICD-10-CM

## 2014-04-29 NOTE — Progress Notes (Signed)
Patient ID: Willie Williamson, male   DOB: [birth year], 80 y.o.   MRN: [id number]      SUBJECTIVE: The patient is here for post cardiac catheterization followup after undergoing drug-eluting stent placement to the right coronary artery. He has severe COPD and saw Dr.Wright on 04/24/14. He denies chest pain. He continues to have dizziness after standing up. Again, the symptoms began rather acutely in the past 6-8 weeks. He had been driving up until 2 months ago.     Allergies  Allergen Reactions  . Penicillins     REACTION: rash    Current Outpatient Prescriptions  Medication Sig Dispense Refill  . albuterol (PROVENTIL HFA;VENTOLIN HFA) 108 (90 BASE) MCG/ACT inhaler Inhale 2 puffs into the lungs every 4 (four) hours as needed for wheezing or shortness of breath.  18 g  6  . aspirin 81 MG tablet Take 81 mg by mouth daily.        . carvedilol (COREG) 3.125 MG tablet Take 1 tablet (3.125 mg total) by mouth 2 (two) times daily with a meal.  60 tablet  0  . clopidogrel (PLAVIX) 75 MG tablet Take 1 tablet (75 mg total) by mouth daily with breakfast.  30 tablet  11  . fluticasone (FLONASE) 50 MCG/ACT nasal spray Place 1 spray into both nostrils 2 (two) times daily as needed.      . Fluticasone-Salmeterol (ADVAIR) 500-50 MCG/DOSE AEPB Inhale 1 puff into the lungs 2 (two) times daily.  60 each  11  . GuaiFENesin (MUCINEX PO) Take 400 mg by mouth 3 (three) times daily as needed (for congestion).       Marland Kitchen levothyroxine (SYNTHROID, LEVOTHROID) 88 MCG tablet Take 88 mcg by mouth daily before breakfast.      . lovastatin (MEVACOR) 20 MG tablet Take 20 mg by mouth at bedtime.        . nitroGLYCERIN (NITROSTAT) 0.4 MG SL tablet Place 1 tablet (0.4 mg total) under the tongue every 5 (five) minutes as needed for chest pain (up to 3 doses).  25 tablet  3   No current facility-administered medications for this visit.    Past Medical History  Diagnosis Date  . HTN (hypertension)   . Hyperlipidemia   . CAD  (coronary artery disease)     a. 03/2014 - s/p DES to RCA, nonobstructive disease in LAD/Cx, EF 60%.  Marland Kitchen COPD (chronic obstructive pulmonary disease) with emphysema     a. Chronic resp failure on home O2 (2L).  Marland Kitchen Hypothyroidism   . H/O hiatal hernia   . Malignant neoplasm of bladder, part unspecified     "it was cured"   . Cataract, congenital     "left eye"  . Anemia     Past Surgical History  Procedure Laterality Date  . Turp vaporization    . Bladder surgery  ` 2006    "bladder polyps"  . Cardiac catheterization  2008  . Coronary angioplasty with stent placement  04/16/2014    "1"  . Tonsillectomy      History   Social History  . Marital Status: Widowed    Spouse Name: N/A    Number of Children: N/A  . Years of Education: N/A   Occupational History  . retired    Social History Main Topics  . Smoking status: Former Smoker -- 1.50 packs/day for 48 years    Types: Cigarettes    Quit date: 11/20/1996  . Smokeless tobacco: Never Used  . Alcohol Use: No  .  Drug Use: No  . Sexual Activity: Not Currently   Other Topics Concern  . Not on file   Social History Narrative  . No narrative on file     Filed Vitals:   04/29/14 0949  BP: 140/70  Pulse: 73  Height: 5\' 7"  (1.702 m)  Weight: 144 lb (65.318 kg)  SpO2: 95%    PHYSICAL EXAM General: NAD Neck: No JVD, no thyromegaly. Lungs: Diminished b/l, no rales. CV: Nondisplaced PMI.  Regular rate and rhythm, normal S1/S2, no S3/S4, no murmur. No pretibial or periankle edema.   Abdomen: Soft, nontender, no hepatosplenomegaly, no distention.  Neurologic: Alert and oriented x 3.  Psych: Normal affect. Extremities: No clubbing or cyanosis.   ECG: reviewed and available in electronic records.  Cath: 1. Normal left main coronary artery. 2. Nonobstructive disease in the left anterior descending artery and its branches. 3. Nonobstructive disease in the left circumflex artery and its branches. 4. 60-70% hazy proximal  right coronary artery lesion. This was significant by intravascular ultrasound of the cross-sectional area of 2.4 mm. Due to severe COPD, we do not want to use adenosine for a pressure wire. The RCA lesion was stented with a 2.75 x 15 Xience drug-eluting stent, postdilated to 3.4 mm in diameter. 5. Normal left ventricular systolic function. LVEDP 8 mmHg. Ejection fraction 60 %.   ASSESSMENT AND PLAN:  Dizziness  His symptoms began rather acutely. Vertebrobasilar insufficiency could be causing his symptoms. MRI of the brain demonstrated chronic microvascular ischemia without acute infarct. I have recommended that he consider seeing an ENT specialist and possibly a neurologist.  CORONARY HEART DISEASE  S/p DES to RCA with nonobstructive disease in remaining arteries. Continue current medical therapy with aspirin, carvedilol, Plavix, and lovastatin.  C O P D  Followed on a regular basis by Dr. Joya Gaskins.   HYPERTENSION  Blood pressure is acceptable given his age as per recent guidelines.   Dispo: f/u 6 months.  Kate Sable, M.D., F.A.C.C.

## 2014-04-29 NOTE — Patient Instructions (Signed)
Your physician recommends that you schedule a follow-up appointment in: 6 months with Dr Koneswaran You will receive a reminder letter two months in advance reminding you to call and schedule your appointment. If you don't receive this letter, please contact our office.  Your physician recommends that you continue on your current medications as directed. Please refer to the Current Medication list given to you today.   

## 2014-05-18 ENCOUNTER — Encounter: Payer: Medicare HMO | Admitting: Cardiovascular Disease

## 2014-07-31 ENCOUNTER — Other Ambulatory Visit: Payer: Self-pay

## 2014-07-31 MED ORDER — CLOPIDOGREL BISULFATE 75 MG PO TABS: 75.0000 mg | ORAL_TABLET | Freq: Every day | ORAL | Status: DC

## 2014-08-24 ENCOUNTER — Ambulatory Visit (INDEPENDENT_AMBULATORY_CARE_PROVIDER_SITE_OTHER): Payer: Medicare HMO | Admitting: Critical Care Medicine

## 2014-08-24 ENCOUNTER — Encounter: Payer: Self-pay | Admitting: Critical Care Medicine

## 2014-08-24 VITALS — BP 130/90 | HR 88 | Temp 96.7°F | Ht 69.0 in | Wt 153.2 lb

## 2014-08-24 DIAGNOSIS — J418 Mixed simple and mucopurulent chronic bronchitis: Secondary | ICD-10-CM

## 2014-08-24 DIAGNOSIS — J432 Centrilobular emphysema: Secondary | ICD-10-CM

## 2014-08-24 MED ORDER — FLUTICASONE-SALMETEROL 500-50 MCG/DOSE IN AEPB: 1.0000 | INHALATION_SPRAY | Freq: Two times a day (BID) | RESPIRATORY_TRACT | Status: DC

## 2014-08-24 MED ORDER — ALBUTEROL SULFATE HFA 108 (90 BASE) MCG/ACT IN AERS: 2.0000 | INHALATION_SPRAY | RESPIRATORY_TRACT | Status: DC | PRN

## 2014-08-24 MED ORDER — FLUTICASONE PROPIONATE 50 MCG/ACT NA SUSP: 1.0000 | Freq: Two times a day (BID) | NASAL | Status: DC | PRN

## 2014-08-24 NOTE — Progress Notes (Signed)
Subjective:    Patient ID: Willie Williamson, male    DOB: 12-Nov-1931, 80 y.o.   MRN: [id number]  HPI  This is a  80 y.o.Marland Kitchen  white male with history of chronic obstructive lung disease.  08/24/2014 Chief Complaint  Patient presents with  . 4 month follow up    Was given steroid injection and levaquin x 5 days by PCP last wk for flare.  Has congestion, prod cough with white mucus, and some increased SOB.  Symptoms are improving.  Pt just got abx /steroid injection/ medrol dosepak.  Since then is better.  Mucus is white.  Now off abx.  No chest pain or fever.  No edema in feet.  Oxygen 2L  Pt denies any significant sore throat, nasal congestion or excess secretions, fever, chills, sweats, unintended weight loss, pleurtic or exertional chest pain, orthopnea PND, or leg swelling Pt denies any increase in rescue therapy over baseline, denies waking up needing it or having any early am or nocturnal exacerbations of coughing/wheezing/or dyspnea. Pt also denies any obvious fluctuation in symptoms with  weather or environmental change or other alleviating or aggravating factors    Review of Systems  Constitutional:   No  weight loss, night sweats,  Fevers, chills, fatigue, lassitude. HEENT:   No headaches,  Difficulty swallowing,  Tooth/dental problems,  Sore throat,                No sneezing, itching, ear ache, nasal congestion, post nasal drip,   CV:  No chest pain,  Orthopnea, PND, swelling in lower extremities, anasarca, dizziness, palpitations  GI  No heartburn, indigestion, abdominal pain, nausea, vomiting, diarrhea, change in bowel habits, loss of appetite  Resp: Notes  shortness of breath with exertion and  at rest.  No excess mucus, no  productive cough,  No non-productive cough,  No coughing up of blood.  No change in color of mucus.  No wheezing.  No chest wall deformity  Skin: no rash or lesions.  GU: no dysuria, change in color of urine, no urgency or frequency.  No flank  pain.  MS:  No joint pain or swelling.  No decreased range of motion.  No back pain.  Psych:  No change in mood or affect. No depression or anxiety.  No memory loss.     Objective:   Physical Exam  Filed Vitals:   08/24/14 0857  BP: 130/90  Pulse: 88  Temp: 96.7 F (35.9 C)  TempSrc: Oral  Height: 5\' 9"  (1.753 m)  Weight: 153 lb 3.2 oz (69.491 kg)  SpO2: 95%    Gen: Pleasant, well-nourished, in no distress,  normal affect  ENT: No lesions,  mouth clear,  oropharynx clear, no postnasal drip  Neck: No JVD, no TMG, no carotid bruits  Lungs: No use of accessory muscles, no dullness to percussion, distant BS , . Poor airflow    Cardiovascular: RRR, heart sounds normal, no murmur or gallops, no peripheral edema  Abdomen: soft and NT, no HSM,  BS normal  Musculoskeletal: No deformities, no cyanosis or clubbing  Neuro: alert, non focal  Skin: Warm, no lesions or rashes   No results found.      Assessment & Plan:   COPD with emphysema Gold stage C. COPD with moderate emphysema and recurrent tracheobronchitis Stable at this time status post recent treatment for same Plan Maintain Advair twice daily Maintain nasal steroid Return 4 months    Updated Medication List Outpatient Encounter Prescriptions as  of 08/24/2014  Medication Sig  . albuterol (PROVENTIL HFA;VENTOLIN HFA) 108 (90 BASE) MCG/ACT inhaler Inhale 2 puffs into the lungs every 4 (four) hours as needed for wheezing or shortness of breath.  Marland Kitchen aspirin 81 MG tablet Take 81 mg by mouth daily.    . carvedilol (COREG) 3.125 MG tablet Take 1 tablet (3.125 mg total) by mouth 2 (two) times daily with a meal.  . clopidogrel (PLAVIX) 75 MG tablet Take 1 tablet (75 mg total) by mouth daily with breakfast.  . fluticasone (FLONASE) 50 MCG/ACT nasal spray Place 1 spray into both nostrils 2 (two) times daily as needed.  . Fluticasone-Salmeterol (ADVAIR) 500-50 MCG/DOSE AEPB Inhale 1 puff into the lungs 2 (two) times  daily.  . GuaiFENesin (MUCINEX PO) Take 400 mg by mouth 3 (three) times daily as needed (for congestion).   Marland Kitchen levothyroxine (SYNTHROID, LEVOTHROID) 88 MCG tablet Take 88 mcg by mouth daily before breakfast.  . lovastatin (MEVACOR) 20 MG tablet Take 20 mg by mouth at bedtime.    . nitroGLYCERIN (NITROSTAT) 0.4 MG SL tablet Place 1 tablet (0.4 mg total) under the tongue every 5 (five) minutes as needed for chest pain (up to 3 doses).  . [DISCONTINUED] albuterol (PROVENTIL HFA;VENTOLIN HFA) 108 (90 BASE) MCG/ACT inhaler Inhale 2 puffs into the lungs every 4 (four) hours as needed for wheezing or shortness of breath.  . [DISCONTINUED] fluticasone (FLONASE) 50 MCG/ACT nasal spray Place 1 spray into both nostrils 2 (two) times daily as needed.  . [DISCONTINUED] Fluticasone-Salmeterol (ADVAIR) 500-50 MCG/DOSE AEPB Inhale 1 puff into the lungs 2 (two) times daily.

## 2014-08-24 NOTE — Patient Instructions (Signed)
No change in medications. Return in         4 months 

## 2014-08-24 NOTE — Assessment & Plan Note (Signed)
Gold stage C. COPD with moderate emphysema and recurrent tracheobronchitis Stable at this time status post recent treatment for same Plan Maintain Advair twice daily Maintain nasal steroid Return 4 months

## 2014-10-26 ENCOUNTER — Ambulatory Visit (INDEPENDENT_AMBULATORY_CARE_PROVIDER_SITE_OTHER): Payer: Medicare HMO | Admitting: Cardiovascular Disease

## 2014-10-26 ENCOUNTER — Encounter: Payer: Self-pay | Admitting: Cardiovascular Disease

## 2014-10-26 VITALS — BP 133/77 | HR 78 | Ht 67.0 in | Wt 148.0 lb

## 2014-10-26 DIAGNOSIS — I10 Essential (primary) hypertension: Secondary | ICD-10-CM

## 2014-10-26 DIAGNOSIS — I2510 Atherosclerotic heart disease of native coronary artery without angina pectoris: Secondary | ICD-10-CM

## 2014-10-26 DIAGNOSIS — J449 Chronic obstructive pulmonary disease, unspecified: Secondary | ICD-10-CM

## 2014-10-26 NOTE — Progress Notes (Signed)
Patient ID: Willie Williamson, male   DOB: 04-17-1931, 80 y.o.   MRN: [id number]      SUBJECTIVE: The patient has a history of CAD with RCA stent placement in May 2015, as well as COPD and follows with pulmonary (Dr. Joya Gaskins). He thinks he may be experiencing the beginning of a COPD flare. He denies chest pain and bleeding problems.   Review of Systems: As per "subjective", otherwise negative.  Allergies  Allergen Reactions  . Penicillins     REACTION: rash    Current Outpatient Prescriptions  Medication Sig Dispense Refill  . albuterol (PROVENTIL HFA;VENTOLIN HFA) 108 (90 BASE) MCG/ACT inhaler Inhale 2 puffs into the lungs every 4 (four) hours as needed for wheezing or shortness of breath. 18 g 6  . aspirin 81 MG tablet Take 81 mg by mouth daily.      . carvedilol (COREG) 3.125 MG tablet Take 1 tablet (3.125 mg total) by mouth 2 (two) times daily with a meal. 60 tablet 0  . clopidogrel (PLAVIX) 75 MG tablet Take 1 tablet (75 mg total) by mouth daily with breakfast. 90 tablet 3  . fluticasone (FLONASE) 50 MCG/ACT nasal spray Place 1 spray into both nostrils 2 (two) times daily as needed. 16 g 6  . Fluticasone-Salmeterol (ADVAIR) 500-50 MCG/DOSE AEPB Inhale 1 puff into the lungs 2 (two) times daily. 60 each 11  . GuaiFENesin (MUCINEX PO) Take 400 mg by mouth 3 (three) times daily as needed (for congestion).     Marland Kitchen levothyroxine (SYNTHROID, LEVOTHROID) 88 MCG tablet Take 88 mcg by mouth daily before breakfast.    . lovastatin (MEVACOR) 20 MG tablet Take 20 mg by mouth at bedtime.      . nitroGLYCERIN (NITROSTAT) 0.4 MG SL tablet Place 1 tablet (0.4 mg total) under the tongue every 5 (five) minutes as needed for chest pain (up to 3 doses). 25 tablet 3   No current facility-administered medications for this visit.    Past Medical History  Diagnosis Date  . HTN (hypertension)   . Hyperlipidemia   . CAD (coronary artery disease)     a. 03/2014 - s/p DES to RCA, nonobstructive disease in  LAD/Cx, EF 60%.  Marland Kitchen COPD (chronic obstructive pulmonary disease) with emphysema     a. Chronic resp failure on home O2 (2L).  Marland Kitchen Hypothyroidism   . H/O hiatal hernia   . Malignant neoplasm of bladder, part unspecified     "it was cured"   . Cataract, congenital     "left eye"  . Anemia     Past Surgical History  Procedure Laterality Date  . Turp vaporization    . Bladder surgery  ` 2006    "bladder polyps"  . Cardiac catheterization  2008  . Coronary angioplasty with stent placement  04/16/2014    "1"  . Tonsillectomy      History   Social History  . Marital Status: Widowed    Spouse Name: N/A    Number of Children: N/A  . Years of Education: N/A   Occupational History  . retired    Social History Main Topics  . Smoking status: Former Smoker -- 1.50 packs/day for 48 years    Types: Cigarettes    Quit date: 11/20/1996  . Smokeless tobacco: Never Used  . Alcohol Use: No  . Drug Use: No  . Sexual Activity: Not Currently   Other Topics Concern  . Not on file   Social History Narrative  . No  narrative on file    BP 133/77 Pulse 78 SpO2 95% Weight 148 lb (67.132 kg) Height 5\' 7"  (1.702 m)    PHYSICAL EXAM General: NAD, using O2 by nasal cannula. Neck: No JVD, no thyromegaly. Lungs: Diminished b/l, no rales. CV: Nondisplaced PMI. Diminished tones. Regular rate and rhythm, normal S1/S2, no S3/S4, no murmur. No pretibial or periankle edema.  Abdomen: Soft, nontender, no hepatosplenomegaly, no distention.  Neurologic: Alert and oriented x 3.  Psych: Normal affect. Skin: Normal. Musculoskeletal: Normal range of motion, no gross deformities. Extremities: No clubbing or cyanosis.   ECG: Most recent ECG reviewed.      ASSESSMENT AND PLAN: 1. CAD s/p RCA DES in 03/2014: Stable ischemic heart disease. Continue dual antiplatelet therapy at least through May 2016. Continue Coreg and lovastatin. 2. Essential HTN: Well controlled on current therapy. No  changes. 3. COPD: No prominent wheezes. May go to PCP for Solu-Medrol injection as he thinks he is experiencing the beginning of an exacerbation. Follows with pulmonary.  Dispo: f/u 6 months.  Kate Sable, M.D., F.A.C.C.

## 2014-10-26 NOTE — Patient Instructions (Signed)
Continue all current medications. Your physician wants you to follow up in:  1 year.  You will receive a reminder letter in the mail one-two months in advance.  If you don't receive a letter, please call our office to schedule the follow up appointment   

## 2014-10-29 ENCOUNTER — Encounter (HOSPITAL_COMMUNITY): Payer: Self-pay | Admitting: Interventional Cardiology

## 2015-04-14 ENCOUNTER — Ambulatory Visit: Payer: Medicare HMO | Admitting: Critical Care Medicine

## 2015-04-28 ENCOUNTER — Other Ambulatory Visit: Payer: Self-pay | Admitting: Critical Care Medicine

## 2015-04-29 ENCOUNTER — Telehealth: Payer: Self-pay | Admitting: Critical Care Medicine

## 2015-04-29 NOTE — Telephone Encounter (Signed)
Did not see ProAir listed in patient's chart, called and spoke to patient, he went through his medications with me over the telephone and confirmed that he is taking ProAir, he also has Proventil that he had gotten from the [place] [facility], but he says that the [place] works better and he prefers it over the Proventil.  Refilled ProAir. Nothing further needed.

## 2015-05-12 ENCOUNTER — Encounter: Payer: Self-pay | Admitting: Critical Care Medicine

## 2015-05-12 ENCOUNTER — Ambulatory Visit (INDEPENDENT_AMBULATORY_CARE_PROVIDER_SITE_OTHER): Payer: Medicare HMO | Admitting: Critical Care Medicine

## 2015-05-12 VITALS — BP 132/80 | HR 104 | Temp 97.8°F | Ht 67.0 in | Wt 141.6 lb

## 2015-05-12 DIAGNOSIS — J432 Centrilobular emphysema: Secondary | ICD-10-CM | POA: Diagnosis not present

## 2015-05-12 MED ORDER — FLUTICASONE PROPIONATE 50 MCG/ACT NA SUSP: 1.0000 | Freq: Two times a day (BID) | NASAL | Status: DC | PRN

## 2015-05-12 MED ORDER — FLUTICASONE-SALMETEROL 500-50 MCG/DOSE IN AEPB: 1.0000 | INHALATION_SPRAY | Freq: Two times a day (BID) | RESPIRATORY_TRACT | Status: DC

## 2015-05-12 MED ORDER — ALBUTEROL SULFATE HFA 108 (90 BASE) MCG/ACT IN AERS: INHALATION_SPRAY | RESPIRATORY_TRACT | Status: DC

## 2015-05-12 NOTE — Progress Notes (Signed)
Subjective:    Patient ID: Willie Williamson, male    DOB: February 20, 1931, 81 y.o.   MRN: [id number]  HPI 05/12/2015 Chief Complaint  Patient presents with  . Follow-up    SOB more frequently when doing normal activities x 4 days.  Cough unchanged with clear to white mucus.  No chest tightness, CP, wheezing, hemoptysis, or f/c/s.   Sl increase in dyspnea last week.  Cough is unchanged, mucus is white.  No real chest pain.  No wheeze.   Pt denies any significant sore throat, nasal congestion or excess secretions, fever, chills, sweats, unintended weight loss, pleurtic or exertional chest pain, orthopnea PND, or leg swelling Pt denies any increase in rescue therapy over baseline, denies waking up needing it or having any early am or nocturnal exacerbations of coughing/wheezing/or dyspnea. Pt also denies any obvious fluctuation in symptoms with  weather or environmental change or other alleviating or aggravating factors  2L rest and mild exertion and occ 3L exertion.    Current Medications, Allergies, Complete Past Medical History, Past Surgical History, Family History, and Social History were reviewed in [facility] record per todays encounter:  05/12/2015  Review of Systems  Constitutional: Negative.  Negative for fever.  HENT: Positive for rhinorrhea. Negative for ear pain, postnasal drip, sinus pressure, sore throat, trouble swallowing and voice change.   Eyes: Negative.   Respiratory: Positive for cough and shortness of breath. Negative for apnea, choking, chest tightness, wheezing and stridor.   Cardiovascular: Negative.  Negative for chest pain, palpitations and leg swelling.  Gastrointestinal: Negative.  Negative for nausea, vomiting, abdominal pain and abdominal distention.  Genitourinary: Negative.   Musculoskeletal: Negative.  Negative for myalgias and arthralgias.  Skin: Negative.  Negative for rash.  Allergic/Immunologic: Negative.  Negative for environmental  allergies and food allergies.  Neurological: Negative.  Negative for dizziness, syncope, weakness and headaches.  Hematological: Negative.  Negative for adenopathy. Does not bruise/bleed easily.  Psychiatric/Behavioral: Negative.  Negative for sleep disturbance and agitation. The patient is not nervous/anxious.        Objective:   Physical Exam Filed Vitals:   05/12/15 0927  BP: 132/80  Pulse: 104  Temp: 97.8 F (36.6 C)  TempSrc: Oral  Height: 5\' 7"  (1.702 m)  Weight: 141 lb 9.6 oz (64.229 kg)  SpO2: 95%    Gen: Pleasant, well-nourished, in no distress,  normal affect  ENT: No lesions,  mouth clear,  oropharynx clear, no postnasal drip  Neck: No JVD, no TMG, no carotid bruits  Lungs: No use of accessory muscles, no dullness to percussion, distant BS   Cardiovascular: RRR, heart sounds normal, no murmur or gallops, no peripheral edema  Abdomen: soft and NT, no HSM,  BS normal  Musculoskeletal: No deformities, no cyanosis or clubbing  Neuro: alert, non focal  Skin: Warm, no lesions or rashes  No results found.        Assessment & Plan:  I personally reviewed all images and lab data in the [facility] system as well as any outside material available during this office visit and agree with the  radiology impressions.   COPD with emphysema Copd with emphysema stable with chronic hypoxemic Resp failure Oxygen dependent, Gold C Very frail , severe  Plan  Copd Gold program No other changes Cont oxygen    Tyresse was seen today for follow-up.  Diagnoses and all orders for this visit:  Centrilobular emphysema  Other orders -     fluticasone (FLONASE)  50 MCG/ACT nasal spray; Place 1 spray into both nostrils 2 (two) times daily as needed. -     Fluticasone-Salmeterol (ADVAIR) 500-50 MCG/DOSE AEPB; Inhale 1 puff into the lungs 2 (two) times daily. -     albuterol (PROAIR HFA) 108 (90 BASE) MCG/ACT inhaler; INHALE 2 PUFFS INTO THE LUNGS EVERY 4 (FOUR) HOURS AS NEEDED FOR  WHEEZING OR SHORTNESS OF BREATH.    I had an extended discussion with the patient and or family lasting 10 minutes of a 25 minute visit including:  Severity of disease , disease state

## 2015-05-12 NOTE — Patient Instructions (Signed)
No change in medications. Return in        6 months        

## 2015-05-13 NOTE — Assessment & Plan Note (Signed)
Copd with emphysema stable with chronic hypoxemic Resp failure Oxygen dependent, Gold C Very frail , severe  Plan  Copd Gold program No other changes Cont oxygen

## 2015-07-27 ENCOUNTER — Other Ambulatory Visit: Payer: Self-pay | Admitting: *Deleted

## 2015-07-27 MED ORDER — CLOPIDOGREL BISULFATE 75 MG PO TABS: 75.0000 mg | ORAL_TABLET | Freq: Every day | ORAL | Status: DC

## 2015-08-03 ENCOUNTER — Other Ambulatory Visit: Payer: Self-pay | Admitting: *Deleted

## 2015-08-03 MED ORDER — FLUTICASONE PROPIONATE 50 MCG/ACT NA SUSP: 1.0000 | Freq: Two times a day (BID) | NASAL | Status: DC | PRN

## 2015-08-03 NOTE — Telephone Encounter (Signed)
90 day supply sent to [facility] Nothing further needed.

## 2015-08-04 ENCOUNTER — Other Ambulatory Visit: Payer: Self-pay | Admitting: *Deleted

## 2015-08-04 MED ORDER — FLUTICASONE-SALMETEROL 500-50 MCG/DOSE IN AEPB: 1.0000 | INHALATION_SPRAY | Freq: Two times a day (BID) | RESPIRATORY_TRACT | Status: DC

## 2015-08-11 ENCOUNTER — Ambulatory Visit (INDEPENDENT_AMBULATORY_CARE_PROVIDER_SITE_OTHER): Payer: Medicare HMO | Admitting: Physician Assistant

## 2015-08-11 ENCOUNTER — Encounter: Payer: Self-pay | Admitting: Physician Assistant

## 2015-08-11 VITALS — BP 138/78 | HR 107 | Ht 67.0 in | Wt 138.0 lb

## 2015-08-11 DIAGNOSIS — I10 Essential (primary) hypertension: Secondary | ICD-10-CM

## 2015-08-11 DIAGNOSIS — J432 Centrilobular emphysema: Secondary | ICD-10-CM

## 2015-08-11 DIAGNOSIS — Z01818 Encounter for other preprocedural examination: Secondary | ICD-10-CM | POA: Diagnosis not present

## 2015-08-11 NOTE — Assessment & Plan Note (Signed)
Patient needs preoperative cardiac clearance before undergoing resection of a bladder tumor with some resection of underlying prostate and middle mycin C instillation. His Plavix and aspirin need to be stopped a week ahead of time. The patient is over a year since his stent placement. He is asymptomatic from a cardiac standpoint. He has had no chest pain, dizziness or presyncope. He can Walk 300-400 feet before his COPD limits him. He is on chronic O2. I discussed this patient with Dr. Harl Bowie. He can stop his Plavix and aspirin prior to upcoming bladder surgery. Resume once the urologists feel it is safe. Follow-up with Dr.Koneswaran in December. No further cardiac testing needed at this time.

## 2015-08-11 NOTE — Patient Instructions (Addendum)
Your physician recommends that you schedule a follow-up appointment in: Dec. With Dr. Bronson Ing  Your physician recommends that you continue on your current medications as directed. Please refer to the Current Medication list given to you today.  You have been cleared for surgery.   Please Stop Plavix and Aspirin 1 week prior to surgery, you may resume after your surgery.   Thank you for choosing [facility]!

## 2015-08-11 NOTE — Assessment & Plan Note (Signed)
Patient had stenting of the RCA 03/2014. He had nonobstructive disease in the circumflex and LAD with normal LV function EF 60%. Patient is asymptomatic and has had no chest pain.

## 2015-08-11 NOTE — Progress Notes (Signed)
Cardiology Office Note   Date:  08/11/2015   ID:  Willie Williamson, DOB May 10, 1931, MRN [id number]  PCP:  [name], MD  Cardiologist: Dr. Bronson Ing   Chief Complaint: Preoperative clearance    History of Present Illness: Willie Williamson is a 81 y.o. male who presents for preoperative clearance before undergoing resection of a bladder tumor with some resection of the prostate. He has history of CAD status post stent to the RCA in May 2015. He had nonobstructive disease in the LAD and circumflex, EF 60%. He also has COPD followed by Dr. Joya Gaskins. He has history of hypertension.  Patient comes in today  without cardiac complaints. He denies any chest tightness, pressure, dizziness or presyncope. He has chronic dyspnea on exertion and is on 2-3 L 24 hours a day. He says he can walk the length of a football field before he gets out of breath. He didn't have a lot of symptoms when he had his stents other than dizziness. He has had no problems with dizziness since then. Patient's heart rate is up a little bit. He says it was hard for him to carry his oxygen tank and walking here. He says resting heart rates at home are usually about 77.    Past Medical History  Diagnosis Date  . HTN (hypertension)   . Hyperlipidemia   . CAD (coronary artery disease)     a. 03/2014 - s/p DES to RCA, nonobstructive disease in LAD/Cx, EF 60%.  Marland Kitchen COPD (chronic obstructive pulmonary disease) with emphysema     a. Chronic resp failure on home O2 (2L).  Marland Kitchen Hypothyroidism   . H/O hiatal hernia   . Malignant neoplasm of bladder, part unspecified     "it was cured"   . Cataract, congenital     "left eye"  . Anemia     Past Surgical History  Procedure Laterality Date  . Turp vaporization    . Bladder surgery  ` 2006    "bladder polyps"  . Cardiac catheterization  2008  . Coronary angioplasty with stent placement  04/16/2014    "1"  . Tonsillectomy    . Left heart catheterization with coronary angiogram N/A  04/16/2014    Procedure: LEFT HEART CATHETERIZATION WITH CORONARY ANGIOGRAM;  Surgeon: [name], MD;  Location[facility] CATH LAB;  Service: Cardiovascular;  Laterality: N/A;  . Percutaneous coronary stent intervention (pci-s)  04/16/2014    Procedure: PERCUTANEOUS CORONARY STENT INTERVENTION (PCI-S);  Surgeon: [name], MD;  Location: [facility] CATH LAB;  Service: Cardiovascular;;     Current Outpatient Prescriptions  Medication Sig Dispense Refill  . albuterol (PROAIR HFA) 108 (90 BASE) MCG/ACT inhaler INHALE 2 PUFFS INTO THE LUNGS EVERY 4 (FOUR) HOURS AS NEEDED FOR WHEEZING OR SHORTNESS OF BREATH. 17 Inhaler 6  . aspirin 81 MG tablet Take 81 mg by mouth daily.      . carvedilol (COREG) 3.125 MG tablet Take 1 tablet (3.125 mg total) by mouth 2 (two) times daily with a meal. 60 tablet 0  . clopidogrel (PLAVIX) 75 MG tablet Take 1 tablet (75 mg total) by mouth daily with breakfast. 90 tablet 3  . fluticasone (FLONASE) 50 MCG/ACT nasal spray Place 1 spray into both nostrils 2 (two) times daily as needed. 54 g 1  . Fluticasone-Salmeterol (ADVAIR) 500-50 MCG/DOSE AEPB Inhale 1 puff into the lungs 2 (two) times daily. 180 each 2  . GuaiFENesin (MUCINEX PO) Take 400 mg by mouth 3 (three) times daily  as needed (for congestion).     Marland Kitchen levothyroxine (SYNTHROID, LEVOTHROID) 88 MCG tablet Take 88 mcg by mouth daily before breakfast.    . lovastatin (MEVACOR) 20 MG tablet Take 20 mg by mouth at bedtime.      . nitroGLYCERIN (NITROSTAT) 0.4 MG SL tablet Place 1 tablet (0.4 mg total) under the tongue every 5 (five) minutes as needed for chest pain (up to 3 doses). 25 tablet 3   No current facility-administered medications for this visit.    Allergies:   Penicillins    Social History:  The patient  reports that he quit smoking about 18 years ago. His smoking use included Cigarettes. He started smoking about 66 years ago. He has a 72 pack-year smoking history. He has never used smokeless tobacco.  He reports that he does not drink alcohol or use illicit drugs.   Family History:  The patient's family history is not on file.    ROS:  Please see the history of present illness.   Otherwise, review of systems are positive for none.   All other systems are reviewed and negative.    PHYSICAL EXAM: VS:  BP 138/78 mmHg  Pulse 107  Ht 5\' 7"  (1.702 m)  Wt 138 lb (62.596 kg)  BMI 21.61 kg/m2  SpO2 96% , BMI Body mass index is 21.61 kg/(m^2). GEN: Well nourished, well developed, in no acute distress Neck: no JVD, HJR, carotid bruits, or masses Cardiac: RRR; no murmurs,gallop, rubs, thrill or heave,  Respiratory:  clear to auscultation bilaterally, normal work of breathing GI: soft, nontender, nondistended, + BS MS: no deformity or atrophy Extremities: without cyanosis, clubbing, edema, good distal pulses bilaterally.  Skin: warm and dry, no rash Neuro:  Strength and sensation are intact    EKG:  EKG is ordered today. The ekg ordered today demonstrates sinus tachycardia at 102 bpm left anterior fascicular block, no acute change Recent Labs: No results found for requested labs within last 365 days.    Lipid Panel No results found for: CHOL, TRIG, HDL, CHOLHDL, VLDL, LDLCALC, LDLDIRECT    Wt Readings from Last 3 Encounters:  08/11/15 138 lb (62.596 kg)  05/12/15 141 lb 9.6 oz (64.229 kg)  10/26/14 148 lb (67.132 kg)      Other studies Reviewed: Additional studies/ records that were reviewed today include and review of the records demonstrates:  03/2014 IMPRESSION: 1.  Abnormal Lexiscan MPI for ischemia   2. Large inferior/inferoseptal infarct with moderate peri-infarct ischemia   3.  Normal left ventricular systolic function, LVEF 84%   4. Intermediate risk study for major cardiac events based on area of myocardium at jeopardy  03/2014 IMPRESSIONS:    1. Normal left main coronary artery. 2. Nonobstructive disease in the left anterior descending artery and its  branches. 3. Nonobstructive disease in the left circumflex artery and its branches. 4. 60-70% hazy proximal right coronary artery lesion. This was significant by intravascular ultrasound of the cross-sectional area of 2.4 mm. Due to severe COPD, we do not want to use adenosine for a pressure wire. The RCA lesion was stented with a 2.75 x 15 Xience drug-eluting stent, postdilated to 3.4 mm in diameter. 5. Normal left ventricular systolic function.  LVEDP 8 mmHg.  Ejection fraction 60 %.  RECOMMENDATION:  Continue dual antiplatelet therapy for at least a year. If an urgent catheterization was done, would be cautious about using the right radial approach due to the tortuosity. Would likely need a Glidewire to navigate the  radial.  2-D echo 02/2014 Study Conclusions  - Left ventricle: The cavity size was normal. Systolic   function was normal. The estimated ejection fraction was   in the range of 60% to 65%. Wall motion was normal; there   were no regional wall motion abnormalities. Doppler   parameters are consistent with abnormal left ventricular   relaxation (grade 1 diastolic dysfunction). - Aortic valve: Trivial regurgitation. Transthoracic echocardiography   ASSESSMENT AND PLAN:  Preoperative clearance Patient needs preoperative cardiac clearance before undergoing resection of a bladder tumor with some resection of underlying prostate and middle mycin C instillation. His Plavix and aspirin need to be stopped a week ahead of time. The patient is over a year since his stent placement. He is asymptomatic from a cardiac standpoint. He has had no chest pain, dizziness or presyncope. He can Walk 300-400 feet before his COPD limits him. He is on chronic O2. I discussed this patient with Dr. Harl Bowie. He can stop his Plavix and aspirin prior to upcoming bladder surgery. Resume once the urologists feel it is safe. Follow-up with Dr.Koneswaran in December. No further cardiac testing needed at this  time.  Coronary atherosclerosis Patient had stenting of the RCA 03/2014. He had nonobstructive disease in the circumflex and LAD with normal LV function EF 60%. Patient is asymptomatic and has had no chest pain.  Essential hypertension Blood pressure is well-controlled.  COPD with emphysema Patient is quite limited due to his COPD. He is on O2 at 2 L 24 hours daily. He is followed by Dr. Asencion Noble.    Signed, Ermalinda Barrios, PA-C  08/11/2015 1:23 PM    [facility] [place], [place], [place]  [address] Phone: [phone]; Fax: [phone]

## 2015-08-11 NOTE — Assessment & Plan Note (Signed)
Blood pressure is well controlled 

## 2015-08-11 NOTE — Assessment & Plan Note (Signed)
Patient is quite limited due to his COPD. He is on O2 at 2 L 24 hours daily. He is followed by Dr. Asencion Noble.

## 2015-08-25 DIAGNOSIS — I10 Essential (primary) hypertension: Secondary | ICD-10-CM | POA: Diagnosis not present

## 2015-08-25 DIAGNOSIS — N429 Disorder of prostate, unspecified: Secondary | ICD-10-CM | POA: Diagnosis not present

## 2015-08-25 DIAGNOSIS — C61 Malignant neoplasm of prostate: Secondary | ICD-10-CM | POA: Diagnosis not present

## 2015-08-25 DIAGNOSIS — Z7951 Long term (current) use of inhaled steroids: Secondary | ICD-10-CM | POA: Diagnosis not present

## 2015-08-25 DIAGNOSIS — Z79899 Other long term (current) drug therapy: Secondary | ICD-10-CM | POA: Diagnosis not present

## 2015-08-25 DIAGNOSIS — N400 Enlarged prostate without lower urinary tract symptoms: Secondary | ICD-10-CM | POA: Diagnosis not present

## 2015-08-25 DIAGNOSIS — C675 Malignant neoplasm of bladder neck: Secondary | ICD-10-CM | POA: Diagnosis not present

## 2015-08-25 DIAGNOSIS — Z955 Presence of coronary angioplasty implant and graft: Secondary | ICD-10-CM | POA: Diagnosis not present

## 2015-08-25 DIAGNOSIS — C680 Malignant neoplasm of urethra: Secondary | ICD-10-CM | POA: Diagnosis not present

## 2015-08-25 DIAGNOSIS — I2510 Atherosclerotic heart disease of native coronary artery without angina pectoris: Secondary | ICD-10-CM | POA: Diagnosis not present

## 2015-08-25 DIAGNOSIS — Z7902 Long term (current) use of antithrombotics/antiplatelets: Secondary | ICD-10-CM | POA: Diagnosis not present

## 2015-08-25 DIAGNOSIS — Z880 Allergy status to penicillin: Secondary | ICD-10-CM | POA: Diagnosis not present

## 2015-08-25 DIAGNOSIS — Z7982 Long term (current) use of aspirin: Secondary | ICD-10-CM | POA: Diagnosis not present

## 2015-09-12 DIAGNOSIS — J449 Chronic obstructive pulmonary disease, unspecified: Secondary | ICD-10-CM | POA: Diagnosis not present

## 2015-09-15 DIAGNOSIS — J441 Chronic obstructive pulmonary disease with (acute) exacerbation: Secondary | ICD-10-CM | POA: Diagnosis not present

## 2015-10-08 DIAGNOSIS — J301 Allergic rhinitis due to pollen: Secondary | ICD-10-CM | POA: Diagnosis not present

## 2015-10-08 DIAGNOSIS — I10 Essential (primary) hypertension: Secondary | ICD-10-CM | POA: Diagnosis not present

## 2015-10-08 DIAGNOSIS — E782 Mixed hyperlipidemia: Secondary | ICD-10-CM | POA: Diagnosis not present

## 2015-10-08 DIAGNOSIS — E039 Hypothyroidism, unspecified: Secondary | ICD-10-CM | POA: Diagnosis not present

## 2015-10-08 DIAGNOSIS — J441 Chronic obstructive pulmonary disease with (acute) exacerbation: Secondary | ICD-10-CM | POA: Diagnosis not present

## 2015-10-08 DIAGNOSIS — R7301 Impaired fasting glucose: Secondary | ICD-10-CM | POA: Diagnosis not present

## 2015-10-08 DIAGNOSIS — J439 Emphysema, unspecified: Secondary | ICD-10-CM | POA: Diagnosis not present

## 2015-10-13 DIAGNOSIS — J449 Chronic obstructive pulmonary disease, unspecified: Secondary | ICD-10-CM | POA: Diagnosis not present

## 2015-10-22 DIAGNOSIS — E782 Mixed hyperlipidemia: Secondary | ICD-10-CM | POA: Diagnosis not present

## 2015-10-22 DIAGNOSIS — I10 Essential (primary) hypertension: Secondary | ICD-10-CM | POA: Diagnosis not present

## 2015-10-22 DIAGNOSIS — R7301 Impaired fasting glucose: Secondary | ICD-10-CM | POA: Diagnosis not present

## 2015-10-29 DIAGNOSIS — J441 Chronic obstructive pulmonary disease with (acute) exacerbation: Secondary | ICD-10-CM | POA: Diagnosis not present

## 2015-10-29 DIAGNOSIS — J301 Allergic rhinitis due to pollen: Secondary | ICD-10-CM | POA: Diagnosis not present

## 2015-10-29 DIAGNOSIS — H269 Unspecified cataract: Secondary | ICD-10-CM | POA: Diagnosis not present

## 2015-10-29 DIAGNOSIS — E782 Mixed hyperlipidemia: Secondary | ICD-10-CM | POA: Diagnosis not present

## 2015-10-29 DIAGNOSIS — E039 Hypothyroidism, unspecified: Secondary | ICD-10-CM | POA: Diagnosis not present

## 2015-10-29 DIAGNOSIS — C679 Malignant neoplasm of bladder, unspecified: Secondary | ICD-10-CM | POA: Diagnosis not present

## 2015-10-29 DIAGNOSIS — J439 Emphysema, unspecified: Secondary | ICD-10-CM | POA: Diagnosis not present

## 2015-10-29 DIAGNOSIS — R7301 Impaired fasting glucose: Secondary | ICD-10-CM | POA: Diagnosis not present

## 2015-10-29 DIAGNOSIS — I10 Essential (primary) hypertension: Secondary | ICD-10-CM | POA: Diagnosis not present

## 2015-10-29 DIAGNOSIS — Z23 Encounter for immunization: Secondary | ICD-10-CM | POA: Diagnosis not present

## 2015-11-03 ENCOUNTER — Encounter: Payer: Self-pay | Admitting: *Deleted

## 2015-11-04 ENCOUNTER — Encounter: Payer: Self-pay | Admitting: Cardiovascular Disease

## 2015-11-04 ENCOUNTER — Ambulatory Visit (INDEPENDENT_AMBULATORY_CARE_PROVIDER_SITE_OTHER): Payer: Medicare HMO | Admitting: Cardiovascular Disease

## 2015-11-04 VITALS — BP 164/95 | HR 85 | Ht 67.0 in | Wt 138.0 lb

## 2015-11-04 DIAGNOSIS — I10 Essential (primary) hypertension: Secondary | ICD-10-CM

## 2015-11-04 DIAGNOSIS — J449 Chronic obstructive pulmonary disease, unspecified: Secondary | ICD-10-CM | POA: Diagnosis not present

## 2015-11-04 DIAGNOSIS — E7800 Pure hypercholesterolemia, unspecified: Secondary | ICD-10-CM

## 2015-11-04 DIAGNOSIS — I2510 Atherosclerotic heart disease of native coronary artery without angina pectoris: Secondary | ICD-10-CM

## 2015-11-04 MED ORDER — NITROGLYCERIN 0.4 MG SL SUBL: 0.4000 mg | SUBLINGUAL_TABLET | SUBLINGUAL | Status: DC | PRN

## 2015-11-04 NOTE — Patient Instructions (Signed)
   Stop Plavix.  Notify office if blood pressure consistently staying above 150. Continue all other medications.   Your physician wants you to follow up in:  1 year.  You will receive a reminder letter in the mail one-two months in advance.  If you don't receive a letter, please call our office to schedule the follow up appointment

## 2015-11-04 NOTE — Progress Notes (Signed)
Patient ID: Willie Williamson, male   DOB: [birth year], 81 y.o.   MRN: [id number]      SUBJECTIVE: The patient presents for routine cardiovascular follow-up. He underwent RCA stenting in May 2015. He had nonobstructive disease in the LAD and circumflex with normal left ventricular systolic function, EF 123456. He has severe COPD and is followed by pulmonology.  He denies chest pains. He occasionally has indigestion with symptoms in his abdomen. He has not been hospitalized recently. He has not had to use sublingual nitroglycerin since his last visit with me.  He checks his blood pressure at home and it runs 120/60 and it was 120/80 last week at his PCPs office.   Review of Systems: As per "subjective", otherwise negative.  Allergies  Allergen Reactions  . Penicillins     REACTION: rash    Current Outpatient Prescriptions  Medication Sig Dispense Refill  . albuterol (PROAIR HFA) 108 (90 BASE) MCG/ACT inhaler INHALE 2 PUFFS INTO THE LUNGS EVERY 4 (FOUR) HOURS AS NEEDED FOR WHEEZING OR SHORTNESS OF BREATH. 17 Inhaler 6  . aspirin 81 MG tablet Take 81 mg by mouth daily.      . carvedilol (COREG) 3.125 MG tablet Take 1 tablet (3.125 mg total) by mouth 2 (two) times daily with a meal. 60 tablet 0  . clopidogrel (PLAVIX) 75 MG tablet Take 1 tablet (75 mg total) by mouth daily with breakfast. 90 tablet 3  . fluticasone (FLONASE) 50 MCG/ACT nasal spray Place 1 spray into both nostrils 2 (two) times daily as needed. 54 g 1  . Fluticasone-Salmeterol (ADVAIR) 500-50 MCG/DOSE AEPB Inhale 1 puff into the lungs 2 (two) times daily. 180 each 2  . GuaiFENesin (MUCINEX PO) Take 400 mg by mouth 3 (three) times daily as needed (for congestion).     Marland Kitchen levothyroxine (SYNTHROID, LEVOTHROID) 88 MCG tablet Take 88 mcg by mouth daily before breakfast.    . lovastatin (MEVACOR) 20 MG tablet Take 20 mg by mouth at bedtime.      . nitroGLYCERIN (NITROSTAT) 0.4 MG SL tablet Place 1 tablet (0.4 mg total) under the tongue  every 5 (five) minutes as needed for chest pain (up to 3 doses). 25 tablet 3  . predniSONE (DELTASONE) 10 MG tablet Take 1 tablet by mouth daily.     No current facility-administered medications for this visit.    Past Medical History  Diagnosis Date  . HTN (hypertension)   . Hyperlipidemia   . CAD (coronary artery disease)     a. 03/2014 - s/p DES to RCA, nonobstructive disease in LAD/Cx, EF 60%.  Marland Kitchen COPD (chronic obstructive pulmonary disease) with emphysema ([place])     a. Chronic resp failure on home O2 (2L).  Marland Kitchen Hypothyroidism   . H/O hiatal hernia   . Malignant neoplasm of bladder, part unspecified     "it was cured"   . Cataract, congenital     "left eye"  . Anemia     Past Surgical History  Procedure Laterality Date  . Turp vaporization    . Bladder surgery  ` 2006    "bladder polyps"  . Cardiac catheterization  2008  . Coronary angioplasty with stent placement  04/16/2014    "1"  . Tonsillectomy    . Left heart catheterization with coronary angiogram N/A 04/16/2014    Procedure: LEFT HEART CATHETERIZATION WITH CORONARY ANGIOGRAM;  Surgeon: [name], MD;  Location: [facility] CATH LAB;  Service: Cardiovascular;  Laterality: N/A;  . Percutaneous coronary  stent intervention (pci-s)  04/16/2014    Procedure: PERCUTANEOUS CORONARY STENT INTERVENTION (PCI-S);  Surgeon: [name], MD;  Location: [facility] CATH LAB;  Service: Cardiovascular;;    Social History   Social History  . Marital Status: Widowed    Spouse Name: N/A  . Number of Children: N/A  . Years of Education: N/A   Occupational History  . retired    Social History Main Topics  . Smoking status: Former Smoker -- 1.50 packs/day for 48 years    Types: Cigarettes    Start date: 01/03/1949    Quit date: 11/20/1996  . Smokeless tobacco: Never Used  . Alcohol Use: No  . Drug Use: No  . Sexual Activity: Not Currently   Other Topics Concern  . Not on file   Social History Narrative     Filed Vitals:    11/04/15 0829 11/04/15 0837  BP: 184/94 164/95  Pulse: 85   Height: 5\' 7"  (1.702 m)   Weight: 138 lb (62.596 kg)   SpO2: 99%     PHYSICAL EXAM General: NAD HEENT: Normal. Neck: No JVD, no thyromegaly. Lungs: Diminished throughout, no rales or wheezes. CV: Very distant heart tones, RRR, no murmur. No pretibial or periankle edema.  No carotid bruit.   Abdomen: Soft, nontender, no distention.  Neurologic: Alert and oriented x 3.  Psych: Normal affect. Skin: Normal. Extremities: No clubbing or cyanosis.   ECG: Most recent ECG reviewed.      ASSESSMENT AND PLAN: 1. CAD s/p RCA DES in 03/2014: Stable ischemic heart disease. Continue ASA but will d/c Plavix. Continue Coreg and lovastatin.  2. Essential HTN: Elevated today but normal at home and at PCP's office. I have asked him to monitor his BP and to inform me if it is consistently elevated.  3. COPD: No prominent wheezes. Follows with pulmonary.  4. Hyperlipidemia: Will obtain copy of lipids from PCP. Continue statin therapy.  Dispo: f/u 1 year.   Kate Sable, M.D., F.A.C.C.

## 2015-11-09 ENCOUNTER — Ambulatory Visit (INDEPENDENT_AMBULATORY_CARE_PROVIDER_SITE_OTHER): Payer: Medicare HMO | Admitting: Adult Health

## 2015-11-09 ENCOUNTER — Encounter: Payer: Self-pay | Admitting: Adult Health

## 2015-11-09 VITALS — BP 132/88 | HR 103 | Temp 98.3°F | Ht 67.0 in | Wt 142.0 lb

## 2015-11-09 DIAGNOSIS — J439 Emphysema, unspecified: Secondary | ICD-10-CM | POA: Diagnosis not present

## 2015-11-09 DIAGNOSIS — J9611 Chronic respiratory failure with hypoxia: Secondary | ICD-10-CM | POA: Diagnosis not present

## 2015-11-09 MED ORDER — FLUTICASONE-SALMETEROL 500-50 MCG/DOSE IN AEPB: 1.0000 | INHALATION_SPRAY | Freq: Two times a day (BID) | RESPIRATORY_TRACT | Status: DC

## 2015-11-09 MED ORDER — ALBUTEROL SULFATE HFA 108 (90 BASE) MCG/ACT IN AERS: INHALATION_SPRAY | RESPIRATORY_TRACT | Status: DC

## 2015-11-09 MED ORDER — TIOTROPIUM BROMIDE MONOHYDRATE 18 MCG IN CAPS: 18.0000 ug | ORAL_CAPSULE | Freq: Every day | RESPIRATORY_TRACT | Status: DC

## 2015-11-09 NOTE — Patient Instructions (Signed)
Continue on Advair Twice daily  , rinse after inhalers  Add Spiriva 1 puff daily  Continue on Oxygen 2l/m .  Remain on Prednisone 10mg  .daily .  Follow up Dr. Ashok Cordia in 3 months and As needed

## 2015-11-09 NOTE — Assessment & Plan Note (Signed)
Prone to frequent COPD exacerbations Consider  spirometry on return If improved on return. Consider decreasing prednisone dose.  Plan  Continue on Advair Twice daily  , rinse after inhalers  Add Spiriva 1 puff daily  Continue on Oxygen 2l/m .  Remain on Prednisone 10mg  .daily .  Follow up Dr. Ashok Cordia in 3 months and As needed

## 2015-11-09 NOTE — Assessment & Plan Note (Signed)
Compensated on oxygen 

## 2015-11-09 NOTE — Progress Notes (Signed)
Subjective:    Patient ID: Willie Williamson, male    DOB: [birth year], 81 y.o.   MRN: [id number]  HPI 81 year old male with Gold C COPD with emphysema and chronic hypoxemic respiratory failure on oxygen   11/09/2015 Follow up : COPD and chronic hypoxemic respiratory failure Pt returns for 3 month follow up .  Says he has been seen by PCP several times with COPD exacerbations. Prednisone was increased 10mg  daily .  Remains on Advair Twice daily  . Has been on Spiriva several years ago. Does not remember if it would helpful . PVX ,Flu and Prevnar utd.  On Oxygen 2l/m . He says that he has an occasional cough with clear to white mucus. Does get short of breath with activities. He denies any chest pain, orthopnea, PND, hemoptysis, or leg swelling Has a good appetite with no nausea, vomiting or diarrhea.  Past Medical History  Diagnosis Date  . HTN (hypertension)   . Hyperlipidemia   . CAD (coronary artery disease)     a. 03/2014 - s/p DES to RCA, nonobstructive disease in LAD/Cx, EF 60%.  Marland Kitchen COPD (chronic obstructive pulmonary disease) with emphysema ([place])     a. Chronic resp failure on home O2 (2L).  Marland Kitchen Hypothyroidism   . H/O hiatal hernia   . Malignant neoplasm of bladder, part unspecified     "it was cured"   . Cataract, congenital     "left eye"  . Anemia    Current Outpatient Prescriptions on File Prior to Visit  Medication Sig Dispense Refill  . albuterol (PROAIR HFA) 108 (90 BASE) MCG/ACT inhaler INHALE 2 PUFFS INTO THE LUNGS EVERY 4 (FOUR) HOURS AS NEEDED FOR WHEEZING OR SHORTNESS OF BREATH. 17 Inhaler 6  . aspirin 81 MG tablet Take 81 mg by mouth daily.      . carvedilol (COREG) 3.125 MG tablet Take 1 tablet (3.125 mg total) by mouth 2 (two) times daily with a meal. 60 tablet 0  . fluticasone (FLONASE) 50 MCG/ACT nasal spray Place 1 spray into both nostrils 2 (two) times daily as needed. 54 g 1  . Fluticasone-Salmeterol (ADVAIR) 500-50 MCG/DOSE AEPB Inhale 1 puff into the  lungs 2 (two) times daily. 180 each 2  . GuaiFENesin (MUCINEX PO) Take 400 mg by mouth 3 (three) times daily as needed (for congestion).     Marland Kitchen levothyroxine (SYNTHROID, LEVOTHROID) 88 MCG tablet Take 88 mcg by mouth daily before breakfast.    . lovastatin (MEVACOR) 20 MG tablet Take 20 mg by mouth at bedtime.      . nitroGLYCERIN (NITROSTAT) 0.4 MG SL tablet Place 1 tablet (0.4 mg total) under the tongue every 5 (five) minutes as needed for chest pain (up to 3 doses). 25 tablet 3  . predniSONE (DELTASONE) 10 MG tablet Take 1 tablet by mouth daily.     No current facility-administered medications on file prior to visit.     Review of Systems Constitutional:   No  weight loss, night sweats,  Fevers, chills, + fatigue, or  lassitude.  HEENT:   No headaches,  Difficulty swallowing,  Tooth/dental problems, or  Sore throat,                No sneezing, itching, ear ache, nasal congestion, post nasal drip,   CV:  No chest pain,  Orthopnea, PND, swelling in lower extremities, anasarca, dizziness, palpitations, syncope.   GI  No heartburn, indigestion, abdominal pain, nausea, vomiting, diarrhea, change in bowel habits, loss  of appetite, bloody stools.   Resp:   No chest wall deformity  Skin: no rash or lesions.  GU: no dysuria, change in color of urine, no urgency or frequency.  No flank pain, no hematuria   MS:  No joint pain or swelling.  No decreased range of motion.  No back pain.  Psych:  No change in mood or affect. No depression or anxiety.  No memory loss.         Objective:   Physical Exam   Filed Vitals:   11/09/15 0926  BP: 132/88  Pulse: 103  Temp: 98.3 F (36.8 C)  TempSrc: Oral  Height: 5\' 7"  (1.702 m)  Weight: 142 lb (64.411 kg)  SpO2: 96%    GEN: A/Ox3; pleasant , NAD, elderly on oxygen  HEENT:  [place]/AT,  EACs-clear, TMs-wnl, NOSE-clear, THROAT-clear, no lesions, no postnasal drip or exudate noted.   NECK:  Supple w/ fair ROM; no JVD; normal carotid  impulses w/o bruits; no thyromegaly or nodules palpated; no lymphadenopathy.  RESP  Decreased breath sounds in the bases no accessory muscle use, no dullness to percussion  CARD:  RRR, no m/r/g  , no peripheral edema, pulses intact, no cyanosis or clubbing.  GI:   Soft & nt; nml bowel sounds; no organomegaly or masses detected.  Musco: Warm bil, no deformities or joint swelling noted.   Neuro: alert, no focal deficits noted.    Skin: Warm, no lesions or rashes         Assessment & Plan:

## 2015-11-09 NOTE — Addendum Note (Signed)
Addended by: Osa Craver on: 11/09/2015 10:13 AM   Modules accepted: Orders

## 2015-11-12 DIAGNOSIS — J449 Chronic obstructive pulmonary disease, unspecified: Secondary | ICD-10-CM | POA: Diagnosis not present

## 2015-12-13 DIAGNOSIS — J441 Chronic obstructive pulmonary disease with (acute) exacerbation: Secondary | ICD-10-CM | POA: Diagnosis not present

## 2015-12-13 DIAGNOSIS — J449 Chronic obstructive pulmonary disease, unspecified: Secondary | ICD-10-CM | POA: Diagnosis not present

## 2015-12-21 DIAGNOSIS — C675 Malignant neoplasm of bladder neck: Secondary | ICD-10-CM | POA: Diagnosis not present

## 2015-12-21 DIAGNOSIS — N401 Enlarged prostate with lower urinary tract symptoms: Secondary | ICD-10-CM | POA: Diagnosis not present

## 2016-01-13 DIAGNOSIS — J449 Chronic obstructive pulmonary disease, unspecified: Secondary | ICD-10-CM | POA: Diagnosis not present

## 2016-01-31 DIAGNOSIS — J439 Emphysema, unspecified: Secondary | ICD-10-CM | POA: Diagnosis not present

## 2016-01-31 DIAGNOSIS — I951 Orthostatic hypotension: Secondary | ICD-10-CM | POA: Diagnosis not present

## 2016-01-31 DIAGNOSIS — I10 Essential (primary) hypertension: Secondary | ICD-10-CM | POA: Diagnosis not present

## 2016-01-31 DIAGNOSIS — R7301 Impaired fasting glucose: Secondary | ICD-10-CM | POA: Diagnosis not present

## 2016-01-31 DIAGNOSIS — E039 Hypothyroidism, unspecified: Secondary | ICD-10-CM | POA: Diagnosis not present

## 2016-01-31 DIAGNOSIS — J441 Chronic obstructive pulmonary disease with (acute) exacerbation: Secondary | ICD-10-CM | POA: Diagnosis not present

## 2016-01-31 DIAGNOSIS — E782 Mixed hyperlipidemia: Secondary | ICD-10-CM | POA: Diagnosis not present

## 2016-01-31 DIAGNOSIS — C679 Malignant neoplasm of bladder, unspecified: Secondary | ICD-10-CM | POA: Diagnosis not present

## 2016-02-10 DIAGNOSIS — J449 Chronic obstructive pulmonary disease, unspecified: Secondary | ICD-10-CM | POA: Diagnosis not present

## 2016-02-11 ENCOUNTER — Telehealth: Payer: Self-pay | Admitting: Pulmonary Disease

## 2016-02-11 ENCOUNTER — Ambulatory Visit (INDEPENDENT_AMBULATORY_CARE_PROVIDER_SITE_OTHER): Payer: Medicare HMO | Admitting: Pulmonary Disease

## 2016-02-11 ENCOUNTER — Encounter: Payer: Self-pay | Admitting: Pulmonary Disease

## 2016-02-11 ENCOUNTER — Ambulatory Visit: Payer: Medicare HMO | Admitting: Pulmonary Disease

## 2016-02-11 VITALS — BP 142/78 | HR 102 | Wt 144.2 lb

## 2016-02-11 DIAGNOSIS — J441 Chronic obstructive pulmonary disease with (acute) exacerbation: Secondary | ICD-10-CM

## 2016-02-11 DIAGNOSIS — J9611 Chronic respiratory failure with hypoxia: Secondary | ICD-10-CM

## 2016-02-11 MED ORDER — BUDESONIDE 0.5 MG/2ML IN SUSP: 0.5000 mg | Freq: Two times a day (BID) | RESPIRATORY_TRACT | Status: DC

## 2016-02-11 MED ORDER — ARFORMOTEROL TARTRATE 15 MCG/2ML IN NEBU: 15.0000 ug | INHALATION_SOLUTION | Freq: Two times a day (BID) | RESPIRATORY_TRACT | Status: DC

## 2016-02-11 MED ORDER — PREDNISONE 10 MG PO TABS: ORAL_TABLET | ORAL | Status: DC

## 2016-02-11 NOTE — Patient Instructions (Signed)
   Continue using your Spiriva as prescribed.  I'm starting you on Brovana & Budesonide to use in your nebulizer twice daily. Do not mix these medications. They replace your Advair. Call me if you feel they do not work as well as your Advair or you have any questions. Remember to rinse, gargle, & spit after the Budesonide to prevent thrush.  I'm starting you on a Prednisone taper  60mg  (6 tablets) daily x 3 days then  50mg  (5 tablets) daily x 3 days then  40mg  (4 tablets) daily x 3 days then  30mg  (3 tablets) daily x 3 days then  20mg  (2 tablets) daily x 3 days then resume your usual dose of Prednisone 10mg  daily   I will see you back in 2-3 weeks but please call me if you have any new breathing problems before then or get worse

## 2016-02-11 NOTE — Progress Notes (Signed)
Subjective:    Patient ID: Willie Williamson, male    DOB: 1931-07-19, 82 y.o.   MRN: [id number]  C.C.:  Follow-up for Moderate-Severe COPD & Chronic Hypoxic Respiratory Failure.  HPI Moderate-Severe COPD: Currently prescribed prednisone 10 mg daily, Spiriva, & Advair twice daily. Spiriva 1 inhalation daily was started at last appointment in December 2016. He reports his breathing did improve after starting Spiriva. He reports his last exacerbation was in January. He reports 2 weeks ago he did have a nebulizer treatment at his PCP's office along with IM steroids and 5 days of antibiotics for a "flareup". He reports he has been coughing more and productive of a clear to white mucus. No hemoptysis. He reports he is using his ProAir more frequently lately >4x/day when he is ill. No nocturnal awakenings with breathing problems. Previously tried on Daliresp but it made him "dizzy".   Chronic Hypoxic Respiratory Failure: Currently prescribed oxygen at 2-3 L/m 24 hours a day. Reports he is compliant with his oxygen therapy.   Review of Systems Denies any fever, chills, or sweats. He reports some mild tightness in his chest. No frank chest pain. No nausea, emesis, or diarrhea. No sore throat. Does have some sinus drainage but no congestion or pressure.   Allergies  Allergen Reactions  . Penicillins     REACTION: rash    Current Outpatient Prescriptions on File Prior to Visit  Medication Sig Dispense Refill  . albuterol (PROAIR HFA) 108 (90 BASE) MCG/ACT inhaler INHALE 2 PUFFS INTO THE LUNGS EVERY 4 (FOUR) HOURS AS NEEDED FOR WHEEZING OR SHORTNESS OF BREATH. 1 Inhaler 5  . aspirin 81 MG tablet Take 81 mg by mouth daily.      . carvedilol (COREG) 3.125 MG tablet Take 1 tablet (3.125 mg total) by mouth 2 (two) times daily with a meal. 60 tablet 0  . fluticasone (FLONASE) 50 MCG/ACT nasal spray Place 1 spray into both nostrils 2 (two) times daily as needed. 54 g 1  . Fluticasone-Salmeterol (ADVAIR)  500-50 MCG/DOSE AEPB Inhale 1 puff into the lungs 2 (two) times daily. 60 each 5  . GuaiFENesin (MUCINEX PO) Take 400 mg by mouth 3 (three) times daily as needed (for congestion).     Marland Kitchen levothyroxine (SYNTHROID, LEVOTHROID) 88 MCG tablet Take 88 mcg by mouth daily before breakfast.    . lovastatin (MEVACOR) 20 MG tablet Take 20 mg by mouth at bedtime.      . nitroGLYCERIN (NITROSTAT) 0.4 MG SL tablet Place 1 tablet (0.4 mg total) under the tongue every 5 (five) minutes as needed for chest pain (up to 3 doses). 25 tablet 3  . predniSONE (DELTASONE) 10 MG tablet Take 1 tablet by mouth daily.    Marland Kitchen tiotropium (SPIRIVA HANDIHALER) 18 MCG inhalation capsule Place 1 capsule (18 mcg total) into inhaler and inhale daily. 30 capsule 5   No current facility-administered medications on file prior to visit.    Past Medical History  Diagnosis Date  . HTN (hypertension)   . Hyperlipidemia   . CAD (coronary artery disease)     a. 03/2014 - s/p DES to RCA, nonobstructive disease in LAD/Cx, EF 60%.  Marland Kitchen COPD (chronic obstructive pulmonary disease) with emphysema ([place])     a. Chronic resp failure on home O2 (2L).  Marland Kitchen Hypothyroidism   . H/O hiatal hernia   . Malignant neoplasm of bladder, part unspecified     "it was cured"   . Cataract, congenital     "  left eye"  . Anemia     Past Surgical History  Procedure Laterality Date  . Turp vaporization    . Bladder surgery  ` 2006    "bladder polyps"  . Cardiac catheterization  2008  . Coronary angioplasty with stent placement  04/16/2014    "1"  . Tonsillectomy    . Left heart catheterization with coronary angiogram N/A 04/16/2014    Procedure: LEFT HEART CATHETERIZATION WITH CORONARY ANGIOGRAM;  Surgeon: [name], MD;  Location: [facility] CATH LAB;  Service: Cardiovascular;  Laterality: N/A;  . Percutaneous coronary stent intervention (pci-s)  04/16/2014    Procedure: PERCUTANEOUS CORONARY STENT INTERVENTION (PCI-S);  Surgeon: [name], MD;   Location: [facility] CATH LAB;  Service: Cardiovascular;;    Family History  Problem Relation Age of Onset  . Bone cancer Mother   . Hypertension Father   . Lung disease Neg Hx     Social History   Social History  . Marital Status: Widowed    Spouse Name: N/A  . Number of Children: N/A  . Years of Education: N/A   Occupational History  . retired    Social History Main Topics  . Smoking status: Former Smoker -- 1.50 packs/day for 48 years    Types: Cigarettes    Start date: 01/03/1949    Quit date: 11/20/1996  . Smokeless tobacco: Never Used  . Alcohol Use: No  . Drug Use: No  . Sexual Activity: Not Currently   Other Topics Concern  . None   Social History Narrative   Originally from [place]. In the [employer] he was in [place], [place], & [place]. 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 No pets currently. Remote bird exposure. No mold exposure.       Objective:   Physical Exam BP 142/78 mmHg  Pulse 102  Wt 144 lb 3.2 oz (65.409 kg)  SpO2 93% General:  Awake. Alert. No acute distress. Family at bedside.  Integument:  Warm & dry. No rash on exposed skin. No bruising. HEENT:  Moist mucus membranes. No oral ulcers. No scleral injection.  Cardiovascular:  Regular rate. No edema.   Pulmonary:  Symmetrically decreased aeration with mild end expiratory weighs in the apices. Prolonged exhalation phase. Mildly increased work of breathing on supplemental oxygen. Abdomen: Soft. Normal bowel sounds. Nondistended.  Musculoskeletal:  Normal bulk and tone. No joint deformity or effusion appreciated.  PFT 03/07/07: FVC 3.88 L (101%) FEV1 1.39 L (55%) FEV1/FVC 0.36 FEF 25-75 0.33 L (14%) positive bronchodilator response TLC 7.19 L (145%) this ERV 130% ERV 75% DLCO corrected 62%  6MWT 07/24/06: Walked 1140ft / Baseline Sat 94% on RA / Nadir Sat 91% on RA    Assessment & Plan:  82 year old Caucasian male with chronic  prednisone use & known underlying moderate-severe COPD. Patient's oxygenation today appears stable. It does appear as though he continues to recover from a COPD exacerbation and as he has no infectious symptoms at this time antibiotics are not necessary. I do feel that given the lack of airflow on physical exam he needs further steroid tapering. Given the severity of his lung disease it may be beneficial to switch him to an alternative inhaled LABA/inhaled corticosteroid to improve drug delivery. I instructed the patient to contact my office if he had any new breathing problems or questions before his next appointment.  1. Moderate-severe COPD with acute exacerbation:  Starting the patient on prednisone taper. Continuing Spiriva. He will resume his basal dose of prednisone at the end of his taper. Switching from Advair to Brovana & budesonide nebulized twice daily. Plan to repeat full pulmonary function testing once the patient recovers & checking alpha-1 antitrypsin phenotype once the patient recovers. 2. Chronic hypoxic respiratory failure: Continuing on oxygen as previously prescribed. Plan to check an oxygen titration walk after patient recovers. 3. Health maintenance: Patient received Prevnar vaccine September 2015, Pneumovax October 2011, & influenza vaccine September 2016. 4. Follow-up: Patient to return to clinic in 2-3 weeks or sooner if needed.  Sonia Baller Ashok Cordia, M.D. [facility] Pager:  [phone] After 3pm or if no response, call [phone] 10:12 AM 02/11/2016

## 2016-02-11 NOTE — Telephone Encounter (Signed)
PFT 03/07/07: FVC 3.88 L (101%) FEV1 1.39 L (85%) FEV1/FVC 0.36 FEF 25-75 0.33 L (14%) positive bronchodilator response TLC 7.19 L (145%) this ERV 130% ERV 75% DLCO corrected 62%  6MWT 07/24/06:  Walked 1144ft / Baseline Sat 94% on RA / Nadir Sat 91% on RA

## 2016-02-15 ENCOUNTER — Telehealth: Payer: Self-pay | Admitting: Pulmonary Disease

## 2016-02-15 MED ORDER — ARFORMOTEROL TARTRATE 15 MCG/2ML IN NEBU: 15.0000 ug | INHALATION_SOLUTION | Freq: Two times a day (BID) | RESPIRATORY_TRACT | Status: DC

## 2016-02-15 MED ORDER — BUDESONIDE 0.5 MG/2ML IN SUSP: 0.5000 mg | Freq: Two times a day (BID) | RESPIRATORY_TRACT | Status: DC

## 2016-02-15 NOTE — Addendum Note (Signed)
Addended by: Osa Craver on: 02/15/2016 05:28 PM   Modules accepted: Orders

## 2016-02-15 NOTE — Telephone Encounter (Signed)
Called and spoke with pt. Pt states that the Brovana and Pulmicort needs to be sent into his mail pharmacy [facility] due to the cost. I explained to him that the medication would be sent today. He voiced understanding and had no further questions. Both rx's have been sent. Nothing further needed.

## 2016-02-16 DIAGNOSIS — R69 Illness, unspecified: Secondary | ICD-10-CM | POA: Diagnosis not present

## 2016-03-01 ENCOUNTER — Ambulatory Visit (INDEPENDENT_AMBULATORY_CARE_PROVIDER_SITE_OTHER): Payer: Medicare HMO | Admitting: Acute Care

## 2016-03-01 ENCOUNTER — Encounter: Payer: Self-pay | Admitting: Acute Care

## 2016-03-01 VITALS — BP 142/86 | HR 90 | Ht 67.0 in | Wt 144.0 lb

## 2016-03-01 DIAGNOSIS — J439 Emphysema, unspecified: Secondary | ICD-10-CM | POA: Diagnosis not present

## 2016-03-01 DIAGNOSIS — J9611 Chronic respiratory failure with hypoxia: Secondary | ICD-10-CM | POA: Diagnosis not present

## 2016-03-01 NOTE — Progress Notes (Signed)
Subjective:    Patient ID: Willie Williamson, male    DOB: [birth year], 82 y.o.   MRN: [id number]  HPI   82 year old male with Gold C COPD with emphysema and chronic hypoxemic respiratory failure on oxygen, on 10 mg prednisone daily as maintenance.  03/01/2016: Follow Up Hospital Visit:  Pt. presents today for follow up of recent hospitalization for COPD Flare.He Reports he is back to his baseline, no fever, clear white secretions, wearing his oxygen at 2L , taking his maintenance prednisone at 10 mg daily, using his Spiriva, but had been taken off his Advair last visit with Dr. Ashok Cordia. Plan had been to replace it with Brovana and Pulmicort Nebs. His insurance does not cover the nebs, and the cost is prohibitive for him. I have asked him to check with the insurance company and to let us know which equivalent nebs they do cover, so we can send in a prescription. In the interim I have told him to resume his Advair until he has the neb treatments.He is not having to use his rescue inhaler at present.He has noticed that he is sensitive to seasonal allergies lately. He is taking Claritin daily.I reinforced that he needs to call us at the first signs of flare so we can be proactive in his treatment. He denies chest pain, orthopnea, hemoptysis, calf or leg pain.   Current outpatient prescriptions:  .  albuterol (PROAIR HFA) 108 (90 BASE) MCG/ACT inhaler, INHALE 2 PUFFS INTO THE LUNGS EVERY 4 (FOUR) HOURS AS NEEDED FOR WHEEZING OR SHORTNESS OF BREATH., Disp: 1 Inhaler, Rfl: 5 .  arformoterol (BROVANA) 15 MCG/2ML NEBU, Take 2 mLs (15 mcg total) by nebulization 2 (two) times daily., Disp: 120 mL, Rfl: 6 .  aspirin 81 MG tablet, Take 81 mg by mouth daily.  , Disp: , Rfl:  .  budesonide (PULMICORT) 0.5 MG/2ML nebulizer solution, Take 2 mLs (0.5 mg total) by nebulization 2 (two) times daily., Disp: 120 mL, Rfl: 6 .  carvedilol (COREG) 3.125 MG tablet, Take 1 tablet (3.125 mg total) by mouth 2 (two) times daily  with a meal., Disp: 60 tablet, Rfl: 0 .  fluticasone (FLONASE) 50 MCG/ACT nasal spray, Place 1 spray into both nostrils 2 (two) times daily as needed., Disp: 54 g, Rfl: 1 .  GuaiFENesin (MUCINEX PO), Take 400 mg by mouth 3 (three) times daily as needed (for congestion). , Disp: , Rfl:  .  levothyroxine (SYNTHROID, LEVOTHROID) 88 MCG tablet, Take 88 mcg by mouth daily before breakfast., Disp: , Rfl:  .  lovastatin (MEVACOR) 20 MG tablet, Take 20 mg by mouth at bedtime.  , Disp: , Rfl:  .  nitroGLYCERIN (NITROSTAT) 0.4 MG SL tablet, Place 1 tablet (0.4 mg total) under the tongue every 5 (five) minutes as needed for chest pain (up to 3 doses)., Disp: 25 tablet, Rfl: 3 .  predniSONE (DELTASONE) 10 MG tablet, Take 1 tablet by mouth daily., Disp: , Rfl:  .  tiotropium (SPIRIVA HANDIHALER) 18 MCG inhalation capsule, Place 1 capsule (18 mcg total) into inhaler and inhale daily., Disp: 30 capsule, Rfl: 5   Past Medical History  Diagnosis Date  . HTN (hypertension)   . Hyperlipidemia   . CAD (coronary artery disease)     a. 03/2014 - s/p DES to RCA, nonobstructive disease in LAD/Cx, EF 60%.  Marland Kitchen COPD (chronic obstructive pulmonary disease) with emphysema ([place])     a. Chronic resp failure on home O2 (2L).  Marland Kitchen Hypothyroidism   .  H/O hiatal hernia   . Malignant neoplasm of bladder, part unspecified     "it was cured"   . Cataract, congenital     "left eye"  . Anemia     Allergies  Allergen Reactions  . Penicillins     REACTION: rash   Review of Systems Constitutional:   No  weight loss, night sweats,  Fevers, chills, fatigue, or  lassitude.  HEENT:   No headaches,  Difficulty swallowing,  Tooth/dental problems, or  Sore throat,                No sneezing, itching, ear ache, nasal congestion, post nasal drip,   CV:  No chest pain,  Orthopnea, PND, swelling in lower extremities, anasarca, dizziness, palpitations, syncope.   GI  No heartburn, indigestion, abdominal pain, nausea, vomiting,  diarrhea, change in bowel habits, loss of appetite, bloody stools.   Resp: + shortness of breath with exertion not at rest.  No excess mucus, no productive cough,  No non-productive cough,  No coughing up of blood.  No change in color of mucus.  No wheezing.  No chest wall deformity  Skin: no rash or lesions.  GU: no dysuria, change in color of urine, no urgency or frequency.  No flank pain, no hematuria   MS:  No joint pain or swelling.  No decreased range of motion.  No back pain.  Psych:  No change in mood or affect. No depression or anxiety.  No memory loss.        Objective:   Physical Exam  BP 142/86 mmHg  Pulse 90  Ht 5\' 7"  (1.702 m)  Wt 144 lb (65.318 kg)  BMI 22.55 kg/m2  SpO2 97%  Physical Exam:  General- No distress,  A&Ox3,Using portable pulsed oxygen at 2 L nasal cannula ENT: No sinus tenderness, TM clear, pale nasal mucosa, no oral exudate,no post nasal drip, no LAN Cardiac: S1, S2, regular rate and rhythm, no murmur Chest: No wheeze/ rales/ dullness; no accessory muscle use, no nasal flaring, no sternal retractions Abd.: Soft Non-tender Ext: No clubbing cyanosis, edema Neuro:  normal strength Skin: No rashes, warm and dry Psych: normal mood and behavior    Magdalen Spatz, AGACNP-BC [facility]  03/01/2016 Assessment & Plan:

## 2016-03-01 NOTE — Patient Instructions (Addendum)
It is nice to meet you today. I am glad you are feeling better. Continue Spiriva Continue Advair until you have your neb treatments, then stop Advair. Continue prednisone at 10 mg daily as maintenance Use your Pro Air inhaler as your rescue medication Continue your oxygen at 2 L Saturation goals are 88-92%. Follow up with Dr. Ashok Cordia in 3 months Call if you need [place] sooner. Please contact office for sooner follow up if symptoms do not improve or worsen or seek emergency care

## 2016-03-01 NOTE — Assessment & Plan Note (Signed)
Compensated on oxygen Plan: Continue oxygen at 2L [place] saturations 88-92%

## 2016-03-01 NOTE — Assessment & Plan Note (Signed)
Recent COPD Flare requiring hospitalization Plan: Continue Spiriva Continue Advair until you have your neb treatments, then stop Advair. Continue prednisone at 10 mg daily as maintenance Use your Pro Air inhaler as your rescue medication Continue your oxygen at 2 L Saturation goals are 88-92%. Follow up with Dr. Ashok Cordia in 3 months Call if you need [place] sooner. Please contact office for sooner follow up if symptoms do not improve or worsen or seek emergency care

## 2016-03-02 NOTE — Progress Notes (Signed)
Note reviewed.  Sonia Baller Ashok Cordia, M.D. [facility] Pulmonary & Critical Care Pager:  [phone] After 3pm or if no response, call [phone] 2:09 AM 03/02/2016

## 2016-03-12 DIAGNOSIS — J449 Chronic obstructive pulmonary disease, unspecified: Secondary | ICD-10-CM | POA: Diagnosis not present

## 2016-03-17 DIAGNOSIS — J301 Allergic rhinitis due to pollen: Secondary | ICD-10-CM | POA: Diagnosis not present

## 2016-03-17 DIAGNOSIS — C679 Malignant neoplasm of bladder, unspecified: Secondary | ICD-10-CM | POA: Diagnosis not present

## 2016-03-17 DIAGNOSIS — I10 Essential (primary) hypertension: Secondary | ICD-10-CM | POA: Diagnosis not present

## 2016-03-17 DIAGNOSIS — J441 Chronic obstructive pulmonary disease with (acute) exacerbation: Secondary | ICD-10-CM | POA: Diagnosis not present

## 2016-03-23 ENCOUNTER — Telehealth: Payer: Self-pay | Admitting: Pulmonary Disease

## 2016-03-23 NOTE — Telephone Encounter (Signed)
Pt unable to afford neb meds prescribed but was told that he can get Symbicort through [place] for free.  Pt was told that Symbicort would be similar to the neb meds prescribed.   Pt states that the neb meds are too expensive at local pharmacy and also Mail order even when filed though Medicare Part B/D. Also, pt states that the [place] does not supply neb meds so getting them there is not an option.  If prescribed Symbicort, pt requests a prescription be mailed to him. Please advise Dr Ashok Cordia on medication alternatives. Thanks.   Patient Instructions 02/11/16      Continue using your Spiriva as prescribed.  I'm starting you on Brovana & Budesonide to use in your nebulizer twice daily. Do not mix these medications. They replace your Advair. Call me if you feel they do not work as well as your Advair or you have any questions. Remember to rinse, gargle, & spit after the Budesonide to prevent thrush.  I'm starting you on a Prednisone taper  60mg  (6 tablets) daily x 3 days then  50mg  (5 tablets) daily x 3 days then  40mg  (4 tablets) daily x 3 days then  30mg  (3 tablets) daily x 3 days then  20mg  (2 tablets) daily x 3 days then resume your usual dose of Prednisone 10mg  daily   I will see you back in 2-3 weeks but please call me if you have any new breathing problems before then or get worse

## 2016-03-23 NOTE — Telephone Encounter (Signed)
Let's start Symbicort 160/4.5 - 2 inhalations twice daily. #3 inhalers with 3 refills. Please also send in a Rx for a spacer with his Symbicort. Patient will need educated on proper technique with these.

## 2016-03-24 MED ORDER — BUDESONIDE-FORMOTEROL FUMARATE 160-4.5 MCG/ACT IN AERO: 2.0000 | INHALATION_SPRAY | Freq: Two times a day (BID) | RESPIRATORY_TRACT | Status: DC

## 2016-03-24 NOTE — Telephone Encounter (Signed)
rx signed and placed in outgoing mail to pt-verified address.  Nothing further needed.

## 2016-03-24 NOTE — Telephone Encounter (Signed)
Spoke with pt. He has been made aware of JN's recommendations. States that he already has a spacer at home. Rx for Symbicort has been printed and will need to be signed by JN. Pt wants this mailed to him so that he can take this to the [place] and get this for free. Will route to [place] as she is working with [employer] today.

## 2016-04-11 DIAGNOSIS — H538 Other visual disturbances: Secondary | ICD-10-CM | POA: Diagnosis not present

## 2016-04-11 DIAGNOSIS — H2511 Age-related nuclear cataract, right eye: Secondary | ICD-10-CM | POA: Diagnosis not present

## 2016-04-11 DIAGNOSIS — J449 Chronic obstructive pulmonary disease, unspecified: Secondary | ICD-10-CM | POA: Diagnosis not present

## 2016-05-06 ENCOUNTER — Other Ambulatory Visit: Payer: Self-pay | Admitting: Adult Health

## 2016-05-09 DIAGNOSIS — R7301 Impaired fasting glucose: Secondary | ICD-10-CM | POA: Diagnosis not present

## 2016-05-09 DIAGNOSIS — E782 Mixed hyperlipidemia: Secondary | ICD-10-CM | POA: Diagnosis not present

## 2016-05-09 DIAGNOSIS — E039 Hypothyroidism, unspecified: Secondary | ICD-10-CM | POA: Diagnosis not present

## 2016-05-09 DIAGNOSIS — I10 Essential (primary) hypertension: Secondary | ICD-10-CM | POA: Diagnosis not present

## 2016-05-11 DIAGNOSIS — R0602 Shortness of breath: Secondary | ICD-10-CM | POA: Diagnosis not present

## 2016-05-11 DIAGNOSIS — R7301 Impaired fasting glucose: Secondary | ICD-10-CM | POA: Diagnosis not present

## 2016-05-11 DIAGNOSIS — E039 Hypothyroidism, unspecified: Secondary | ICD-10-CM | POA: Diagnosis not present

## 2016-05-11 DIAGNOSIS — I10 Essential (primary) hypertension: Secondary | ICD-10-CM | POA: Diagnosis not present

## 2016-05-11 DIAGNOSIS — C679 Malignant neoplasm of bladder, unspecified: Secondary | ICD-10-CM | POA: Diagnosis not present

## 2016-05-11 DIAGNOSIS — J441 Chronic obstructive pulmonary disease with (acute) exacerbation: Secondary | ICD-10-CM | POA: Diagnosis not present

## 2016-05-11 DIAGNOSIS — E782 Mixed hyperlipidemia: Secondary | ICD-10-CM | POA: Diagnosis not present

## 2016-05-11 DIAGNOSIS — J439 Emphysema, unspecified: Secondary | ICD-10-CM | POA: Diagnosis not present

## 2016-05-12 DIAGNOSIS — J449 Chronic obstructive pulmonary disease, unspecified: Secondary | ICD-10-CM | POA: Diagnosis not present

## 2016-05-16 DIAGNOSIS — R8290 Unspecified abnormal findings in urine: Secondary | ICD-10-CM | POA: Diagnosis not present

## 2016-05-16 DIAGNOSIS — R3129 Other microscopic hematuria: Secondary | ICD-10-CM | POA: Diagnosis not present

## 2016-05-16 DIAGNOSIS — Z8551 Personal history of malignant neoplasm of bladder: Secondary | ICD-10-CM | POA: Diagnosis not present

## 2016-05-16 DIAGNOSIS — C675 Malignant neoplasm of bladder neck: Secondary | ICD-10-CM | POA: Diagnosis not present

## 2016-06-11 DIAGNOSIS — J449 Chronic obstructive pulmonary disease, unspecified: Secondary | ICD-10-CM | POA: Diagnosis not present

## 2016-06-14 ENCOUNTER — Other Ambulatory Visit: Payer: Medicare HMO

## 2016-06-14 ENCOUNTER — Ambulatory Visit (INDEPENDENT_AMBULATORY_CARE_PROVIDER_SITE_OTHER): Payer: Medicare HMO | Admitting: Pulmonary Disease

## 2016-06-14 ENCOUNTER — Encounter: Payer: Self-pay | Admitting: Pulmonary Disease

## 2016-06-14 VITALS — BP 138/82 | HR 87 | Ht 67.0 in | Wt 151.4 lb

## 2016-06-14 DIAGNOSIS — J449 Chronic obstructive pulmonary disease, unspecified: Secondary | ICD-10-CM | POA: Diagnosis not present

## 2016-06-14 DIAGNOSIS — J9611 Chronic respiratory failure with hypoxia: Secondary | ICD-10-CM | POA: Diagnosis not present

## 2016-06-14 MED ORDER — TIOTROPIUM BROMIDE MONOHYDRATE 18 MCG IN CAPS: ORAL_CAPSULE | RESPIRATORY_TRACT | 3 refills | Status: DC

## 2016-06-14 MED ORDER — BUDESONIDE-FORMOTEROL FUMARATE 160-4.5 MCG/ACT IN AERO: 2.0000 | INHALATION_SPRAY | Freq: Two times a day (BID) | RESPIRATORY_TRACT | 3 refills | Status: DC

## 2016-06-14 NOTE — Patient Instructions (Addendum)
   Continue using your Symbicort and Spiriva.  Call my office if you have any new breathing problems before your next appointment.  I will see you back in 6 months or sooner if needed.  TESTS ORDERED: 1. Alpha-1 Antitrypsin Phenotype today

## 2016-06-14 NOTE — Progress Notes (Signed)
Subjective:    Patient ID: Willie Williamson, male    DOB: Jul 04, 1931, 82 y.o.   MRN: [id number]  C.C.:  Follow-up for Moderate-Severe COPD & Chronic Hypoxic Respiratory Failure.  HPI Moderate-Severe COPD: Switched from Advair to [place] & Budesonide at last appointment with me but it was too expensive so we switched to Symbicort 160/4.5 with a spacer. On chronic Prednisone 10mg  daily along with Spiriva. He reports he feels "pretty good". He did feel his dyspnea began to worsen slightly a couple days ago. Denies any coughing or audible wheezing. He did have a flare since his last appointment treated with a steroid IM injection & a course of antibiotics.   Chronic Hypoxic Respiratory Failure: Currently prescribed oxygen at 2-3 L/m 24 hours a day. Compliant with oxygen therapy.   Review of Systems Denies any chest pain, pressure, or tightness. No fever, chills, or sweats. No abdominal pain or nausea. No reflux.   Allergies  Allergen Reactions  . Penicillins     REACTION: rash    Current Outpatient Prescriptions on File Prior to Visit  Medication Sig Dispense Refill  . albuterol (PROAIR HFA) 108 (90 BASE) MCG/ACT inhaler INHALE 2 PUFFS INTO THE LUNGS EVERY 4 (FOUR) HOURS AS NEEDED FOR WHEEZING OR SHORTNESS OF BREATH. 1 Inhaler 5  . aspirin 81 MG tablet Take 81 mg by mouth daily.      . carvedilol (COREG) 3.125 MG tablet Take 1 tablet (3.125 mg total) by mouth 2 (two) times daily with a meal. 60 tablet 0  . fluticasone (FLONASE) 50 MCG/ACT nasal spray Place 1 spray into both nostrils 2 (two) times daily as needed. 54 g 1  . GuaiFENesin (MUCINEX PO) Take 400 mg by mouth 3 (three) times daily as needed (for congestion).     Marland Kitchen levothyroxine (SYNTHROID, LEVOTHROID) 88 MCG tablet Take 88 mcg by mouth daily before breakfast.    . lovastatin (MEVACOR) 20 MG tablet Take 20 mg by mouth at bedtime.      . nitroGLYCERIN (NITROSTAT) 0.4 MG SL tablet Place 1 tablet (0.4 mg total) under the tongue every 5  (five) minutes as needed for chest pain (up to 3 doses). 25 tablet 3  . predniSONE (DELTASONE) 10 MG tablet Take 1 tablet by mouth daily.     No current facility-administered medications on file prior to visit.     Past Medical History:  Diagnosis Date  . Anemia   . CAD (coronary artery disease)    a. 03/2014 - s/p DES to RCA, nonobstructive disease in LAD/Cx, EF 60%.  . Cataract, congenital    "left eye"  . COPD (chronic obstructive pulmonary disease) with emphysema ([place])    a. Chronic resp failure on home O2 (2L).  . H/O hiatal hernia   . HTN (hypertension)   . Hyperlipidemia   . Hypothyroidism   . Malignant neoplasm of bladder, part unspecified    "it was cured"     Past Surgical History:  Procedure Laterality Date  . BLADDER SURGERY  ` 2006   "bladder polyps"  . CARDIAC CATHETERIZATION  2008  . CORONARY ANGIOPLASTY WITH STENT PLACEMENT  04/16/2014   "1"  . LEFT HEART CATHETERIZATION WITH CORONARY ANGIOGRAM N/A 04/16/2014   Procedure: LEFT HEART CATHETERIZATION WITH CORONARY ANGIOGRAM;  Surgeon: [name], MD;  Location: [facility] CATH LAB;  Service: Cardiovascular;  Laterality: N/A;  . PERCUTANEOUS CORONARY STENT INTERVENTION (PCI-S)  04/16/2014   Procedure: PERCUTANEOUS CORONARY STENT INTERVENTION (PCI-S);  Surgeon: Charlann Lange  Irish Lack, MD;  Location: [facility] CATH LAB;  Service: Cardiovascular;;  . TONSILLECTOMY    . TURP VAPORIZATION      Family History  Problem Relation Age of Onset  . Bone cancer Mother   . Hypertension Father   . Lung disease Neg Hx     Social History   Social History  . Marital status: Widowed    Spouse name: N/A  . Number of children: N/A  . Years of education: N/A   Occupational History  . retired    Social History Main Topics  . Smoking status: Former Smoker    Packs/day: 1.50    Years: 48.00    Types: Cigarettes    Start date: 01/03/1949    Quit date: 11/20/1996  . Smokeless tobacco: Never Used  . Alcohol use No  . Drug use: No    . Sexual activity: Not Currently   Other Topics Concern  . None   Social History Narrative   Originally from [place]. In the [employer] he was in [place], [place], & [place]. 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 No pets currently. Remote bird exposure. No mold exposure.       Objective:   Physical Exam BP 138/82 (BP Location: Left Arm, Cuff Size: Normal)   Pulse 87   Ht 5\' 7"  (1.702 m)   Wt 151 lb 6.4 oz (68.7 kg)   SpO2 95%   BMI 23.71 kg/m  General:  Awake. Nodistress. Comfotable failure, Integument:  Warm & dry. No rash on exposed skin. Bruising of various ages on bilateral upper extremities. HEENT:  Moist mucus membranes. No oral ulcers. No scleral icterus.  Cardiovascular:  Regular rate & rhythm. No edema.   Pulmonary:  Symmetrically decreased aeration with prolonged exhalation phase. Normal work of breathing on supplemental oxygen. Focal wheeze and left lower lung cleared with cough.  Abdomen: Soft. Normal bowel sounds. Nondistended.  Musculoskeletal:  Normal bulk and tone. No joint deformity or effusion appreciated.  PFT 03/07/07: FVC 3.88 L (101%) FEV1 1.39 L (55%) FEV1/FVC 0.36 FEF 25-75 0.33 L (14%) positive bronchodilator response TLC 7.19 L (145%) this ERV 130% ERV 75% DLCO corrected 62%  6MWT 07/24/06: Walked 1168ft / Baseline Sat 94% on RA / Nadir Sat 91% on RA    Assessment & Plan:  82 year old Caucasian male with chronic prednisone use & known underlying moderate-severe COPD. Patient seems to have recovered from his exacerbations since last appointment. I do feel that some of his slight dyspnea is being controlled with his new Symbicort. I do question whether or not he may have underlying alpha-1 antitrypsin deficiency and feel this needs to be checked today. He continues to use oxygen as prescribed. Given his excellent symptom control over the last 2 months despite increased heat and humidity I  feel it's reasonable to extend out his appointment times. I instructed the patient contact my office if he developed any new breathing problems as I would be happy to see him sooner.  1. Moderate-severe COPD: Screening for alpha-1 antitrypsin deficiency. Continuing prednisone 10 mg daily, Symbicort, & Spiriva. Patient provided with 3 month prescriptions for his mail order pharmacy through the [place]. 2. Chronic Hypoxic Respiratory Failure: Continuing on oxygen as previously prescribed.  3. Health maintenance: Patient received Prevnar vaccine September 2015 & Pneumovax October 2011. Recommended influenza vaccine in the Fall. 4. Follow-up: Patient to return to clinic in 6 months  or sooner if needed.  Sonia Baller Ashok Cordia, M.D. [facility] Pulmonary & Critical Care Pager:  [phone] After 3pm or if no response, call [phone] 10:05 AM 06/14/16

## 2016-06-16 LAB — ALPHA-1-ANTITRYPSIN: A-1 Antitrypsin, Ser: 152 mg/dL (ref 83–199)

## 2016-07-07 ENCOUNTER — Other Ambulatory Visit: Payer: Self-pay | Admitting: Critical Care Medicine

## 2016-07-12 DIAGNOSIS — J449 Chronic obstructive pulmonary disease, unspecified: Secondary | ICD-10-CM | POA: Diagnosis not present

## 2016-08-10 DIAGNOSIS — Z23 Encounter for immunization: Secondary | ICD-10-CM | POA: Diagnosis not present

## 2016-08-10 DIAGNOSIS — Z6823 Body mass index (BMI) 23.0-23.9, adult: Secondary | ICD-10-CM | POA: Diagnosis not present

## 2016-08-10 DIAGNOSIS — I951 Orthostatic hypotension: Secondary | ICD-10-CM | POA: Diagnosis not present

## 2016-08-10 DIAGNOSIS — C679 Malignant neoplasm of bladder, unspecified: Secondary | ICD-10-CM | POA: Diagnosis not present

## 2016-08-10 DIAGNOSIS — E782 Mixed hyperlipidemia: Secondary | ICD-10-CM | POA: Diagnosis not present

## 2016-08-10 DIAGNOSIS — E039 Hypothyroidism, unspecified: Secondary | ICD-10-CM | POA: Diagnosis not present

## 2016-08-10 DIAGNOSIS — I10 Essential (primary) hypertension: Secondary | ICD-10-CM | POA: Diagnosis not present

## 2016-08-10 DIAGNOSIS — J441 Chronic obstructive pulmonary disease with (acute) exacerbation: Secondary | ICD-10-CM | POA: Diagnosis not present

## 2016-08-12 DIAGNOSIS — J449 Chronic obstructive pulmonary disease, unspecified: Secondary | ICD-10-CM | POA: Diagnosis not present

## 2016-09-05 ENCOUNTER — Telehealth: Payer: Self-pay | Admitting: Pulmonary Disease

## 2016-09-05 NOTE — Telephone Encounter (Signed)
Attempted to contact pt. No answer, no option to leave a message. Will try back.  

## 2016-09-06 NOTE — Telephone Encounter (Signed)
Attempted to call pt x 2 It stated that the wireless caller you are calling is not available.  Try back later.

## 2016-09-07 NOTE — Telephone Encounter (Signed)
Attempted to contact pt x 3.  Will sign off of message and wait for a call back.

## 2016-09-11 DIAGNOSIS — J449 Chronic obstructive pulmonary disease, unspecified: Secondary | ICD-10-CM | POA: Diagnosis not present

## 2016-09-13 DIAGNOSIS — J441 Chronic obstructive pulmonary disease with (acute) exacerbation: Secondary | ICD-10-CM | POA: Diagnosis not present

## 2016-09-13 DIAGNOSIS — H6123 Impacted cerumen, bilateral: Secondary | ICD-10-CM | POA: Diagnosis not present

## 2016-09-13 DIAGNOSIS — Z6823 Body mass index (BMI) 23.0-23.9, adult: Secondary | ICD-10-CM | POA: Diagnosis not present

## 2016-09-26 DIAGNOSIS — H11821 Conjunctivochalasis, right eye: Secondary | ICD-10-CM | POA: Diagnosis not present

## 2016-09-26 DIAGNOSIS — H2511 Age-related nuclear cataract, right eye: Secondary | ICD-10-CM | POA: Diagnosis not present

## 2016-09-26 DIAGNOSIS — H43811 Vitreous degeneration, right eye: Secondary | ICD-10-CM | POA: Diagnosis not present

## 2016-10-03 ENCOUNTER — Ambulatory Visit: Payer: Medicare HMO | Admitting: Pulmonary Disease

## 2016-10-06 ENCOUNTER — Other Ambulatory Visit: Payer: Medicare HMO

## 2016-10-06 ENCOUNTER — Encounter: Payer: Self-pay | Admitting: Pulmonary Disease

## 2016-10-06 ENCOUNTER — Ambulatory Visit (INDEPENDENT_AMBULATORY_CARE_PROVIDER_SITE_OTHER): Payer: Medicare HMO | Admitting: Pulmonary Disease

## 2016-10-06 VITALS — BP 126/74 | HR 117 | Ht 67.0 in | Wt 156.0 lb

## 2016-10-06 DIAGNOSIS — J9611 Chronic respiratory failure with hypoxia: Secondary | ICD-10-CM | POA: Diagnosis not present

## 2016-10-06 DIAGNOSIS — J449 Chronic obstructive pulmonary disease, unspecified: Secondary | ICD-10-CM | POA: Diagnosis not present

## 2016-10-06 DIAGNOSIS — J302 Other seasonal allergic rhinitis: Secondary | ICD-10-CM

## 2016-10-06 DIAGNOSIS — Z7952 Long term (current) use of systemic steroids: Secondary | ICD-10-CM | POA: Diagnosis not present

## 2016-10-06 MED ORDER — MONTELUKAST SODIUM 5 MG PO CHEW: 5.0000 mg | CHEWABLE_TABLET | Freq: Every day | ORAL | 2 refills | Status: DC

## 2016-10-06 NOTE — Patient Instructions (Signed)
   Call me if you have any new breathing problems before your next appointment.  Remember to take the new allergy medicine at night before bed. Call me if you have any problems with the medication.  I will see you back in 3 months or sooner if needed.  TESTS ORDERED: 1. Serum Alpha-1 Antitrypsin Phenotype & RAST Panel today.

## 2016-10-06 NOTE — Progress Notes (Signed)
Subjective:    Patient ID: Willie Williamson, male    DOB: 10-Sep-1931, 82 y.o.   MRN: [id number]  C.C.:  Follow-up for Moderate-Severe COPD, Chronic Hypoxic Respiratory Failure, Chronic Steroid/Prednisone Use, Chronic Seasonal Allergic Rhinitis.  HPI  The patient reports he was seen by his PCP 3 weeks ago by his PCP for an "exacerbation". They gave him a Medrol Dosepak and an IM steroid injection along with an antibiotic:  Cipro.   Moderate-Severe COPD:  Prescribed Symbicort 160/4.5 with spacer & Spiriva. Chronically on Prednisone 10mg  daily. The patient reports his symptoms improved and he returned to his baseline breathing & coughing until recently. He reports his dyspnea seems to have worsened over the last couple of days. He reports his cough has improved as well. Denies any wheezing. He reports he is using his rescue inhaler varying amounts from none to up to 3 times daily. Denies any nocturnal awakenings with any coughing or wheezing. Compliant with his Symbicort & Spiriva.   Chronic Hypoxic Respiratory Failure: Currently prescribed oxygen at 2-3 L/m 24 hours a day. Reports he is still using his oxygen as prescribed.   Chronic Steroid/Prednisone Use:  Currently on Prednisone 10mg  daily. He has been on steroids for approximately 2 years.   Chronic Seasonal Allergic Rhinitis:  He reports he has had increasing problems with sinus congestion & drainage, especially in the Spring. This has been more in the last couple of years. He is using the [employer]. He reports he occasionally takes Claritin. He denies any recent sinus congestion or drainage but was recently having more symptoms.   Review of Systems  Denies any recent chest pain, pressure, or tightness. No fever, chills, or sweats. No abdominal pain, nausea, or emesis.  Allergies  Allergen Reactions  . Penicillins     REACTION: rash    Current Outpatient Prescriptions on File Prior to Visit  Medication Sig Dispense Refill  .  albuterol (PROAIR HFA) 108 (90 BASE) MCG/ACT inhaler INHALE 2 PUFFS INTO THE LUNGS EVERY 4 (FOUR) HOURS AS NEEDED FOR WHEEZING OR SHORTNESS OF BREATH. 1 Inhaler 5  . aspirin 81 MG tablet Take 81 mg by mouth daily.      . budesonide-formoterol (SYMBICORT) 160-4.5 MCG/ACT inhaler Inhale 2 puffs into the lungs 2 (two) times daily. 3 Inhaler 3  . carvedilol (COREG) 3.125 MG tablet Take 1 tablet (3.125 mg total) by mouth 2 (two) times daily with a meal. 60 tablet 0  . fluticasone (FLONASE) 50 MCG/ACT nasal spray PLACE 1 SPRAY INTO BOTH NOSTRILS 2 (TWO) TIMES DAILY AS NEEDED. 16 g 5  . GuaiFENesin (MUCINEX PO) Take 400 mg by mouth 3 (three) times daily as needed (for congestion).     Marland Kitchen levothyroxine (SYNTHROID, LEVOTHROID) 88 MCG tablet Take 88 mcg by mouth daily before breakfast.    . lovastatin (MEVACOR) 20 MG tablet Take 20 mg by mouth at bedtime.      . nitroGLYCERIN (NITROSTAT) 0.4 MG SL tablet Place 1 tablet (0.4 mg total) under the tongue every 5 (five) minutes as needed for chest pain (up to 3 doses). 25 tablet 3  . predniSONE (DELTASONE) 10 MG tablet Take 1 tablet by mouth daily.    Marland Kitchen tiotropium (SPIRIVA HANDIHALER) 18 MCG inhalation capsule PLACE 1 CAPSULE (18 MCG TOTAL) INTO INHALER AND INHALE DAILY. 90 capsule 3   No current facility-administered medications on file prior to visit.     Past Medical History:  Diagnosis Date  . Anemia   .  CAD (coronary artery disease)    a. 03/2014 - s/p DES to RCA, nonobstructive disease in LAD/Cx, EF 60%.  . Cataract, congenital    "left eye"  . COPD (chronic obstructive pulmonary disease) with emphysema ([place])    a. Chronic resp failure on home O2 (2L).  . H/O hiatal hernia   . HTN (hypertension)   . Hyperlipidemia   . Hypothyroidism   . Malignant neoplasm of bladder, part unspecified    "it was cured"     Past Surgical History:  Procedure Laterality Date  . BLADDER SURGERY  ` 2006   "bladder polyps"  . CARDIAC CATHETERIZATION  2008  .  CORONARY ANGIOPLASTY WITH STENT PLACEMENT  04/16/2014   "1"  . LEFT HEART CATHETERIZATION WITH CORONARY ANGIOGRAM N/A 04/16/2014   Procedure: LEFT HEART CATHETERIZATION WITH CORONARY ANGIOGRAM;  Surgeon: [name], MD;  Location: [facility] CATH LAB;  Service: Cardiovascular;  Laterality: N/A;  . PERCUTANEOUS CORONARY STENT INTERVENTION (PCI-S)  04/16/2014   Procedure: PERCUTANEOUS CORONARY STENT INTERVENTION (PCI-S);  Surgeon: [name], MD;  Location: [facility] CATH LAB;  Service: Cardiovascular;;  . TONSILLECTOMY    . TURP VAPORIZATION      Family History  Problem Relation Age of Onset  . Bone cancer Mother   . Hypertension Father   . Lung disease Neg Hx     Social History   Social History  . Marital status: Widowed    Spouse name: N/A  . Number of children: N/A  . Years of education: N/A   Occupational History  . retired    Social History Main Topics  . Smoking status: Former Smoker    Packs/day: 1.50    Years: 48.00    Types: Cigarettes    Start date: 01/03/1949    Quit date: 11/20/1996  . Smokeless tobacco: Never Used  . Alcohol use No  . Drug use: No  . Sexual activity: Not Currently   Other Topics Concern  . None   Social History Narrative   Originally from [place]. 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 No pets currently. Remote bird exposure. No mold exposure.       Objective:   Physical Exam BP 126/74 (BP Location: Right Arm, Cuff Size: Normal)   Pulse (!) 117   Ht 5\' 7"  (1.702 m)   Wt 156 lb (70.8 kg)   SpO2 97%   BMI 24.43 kg/m  General:  Awake. Son with patient today. Comfortable. Integument:  Warm & dry. No rash on exposed skin. HEENT:  Moist mucus membranes. Mild bilateral nasal turbinate swelling. No oral ulcers. Cardiovascular:  Regular rate & rhythm. Trace lower extremity edema bilaterally.  Pulmonary: Symmetrically  decreased breath sounds. Otherwise clear on auscultation. Normal work of breathing on supplemental oxygen.  Abdomen: Soft. Normal bowel sounds. Nondistended.  Musculoskeletal:  Normal bulk and tone. No joint deformity or effusion appreciated.  PFT 03/07/07: FVC 3.88 L (101%) FEV1 1.39 L (55%) FEV1/FVC 0.36 FEF 25-75 0.33 L (14%) positive bronchodilator response TLC 7.19 L (145%) this ERV 130% ERV 75% DLCO corrected 62%  6MWT 07/24/06: Walked 1139ft / Baseline Sat 94% on RA / Nadir Sat 91% on RA    Assessment & Plan:  82 y.o. Caucasian male with chronic hypoxic respiratory failure & moderate-severe COPD.Given his increasing seasonal allergic rhinitis I do feel there  may be some asthmatic overlap. He may benefit from the addition of Singulair to his regimen. He is continuing to use oxygen as previously prescribed. I do not feel that a steroid taper is necessary at this time nor do I feel antibiotics are warranted. I instructed the patient contact my office if he had any new breathing problems or questions before his next appointment.  1. Moderate-severe COPD:  Suspect there may be some asthmatic overlap. Starting Singulair. Continuing Symbicort and Spiriva. Also continuing daily prednisone. 2. Chronic Hypoxic Respiratory Failure: Continuing on oxygen as previously prescribed.  3. Chronic Seasonal Allergic Rhinitis:  Patient advised to try using a nasal saline rinse and Flonase for sinus congestion. Starting Singulair 5 mg by mouth daily at bedtime. Checking RAST panel. 4. Chronic Prednisone/Steroid Use: Patient continuing on chronic dose of 10 mg daily. 5. Health Maintenance: S/P Influenza Vaccine September 2017, Prevnar vaccine September 2015 & Pneumovax October 2011.  6. Follow-up: Patient to return to clinic in 3 months or sooner if needed.  Sonia Baller Ashok Cordia, M.D. [facility] Pulmonary & Critical Care Pager:  [phone] After 3pm or if no response, call [phone] 12:26 PM 10/06/16

## 2016-10-09 LAB — RESPIRATORY ALLERGY PROFILE REGION II ~~LOC~~
Allergen, C. Herbarum, M2: <0.1 kU/L
Allergen, Cottonwood, t14: <0.1 kU/L
Allergen, D pternoyssinus,d7: <0.1 kU/L
Allergen, Mulberry, t76: <0.1 kU/L
Bermuda Grass: <0.1 kU/L
Cat Dander: <0.1 kU/L
Cockroach: <0.1 kU/L
D. farinae: <0.1 kU/L
Dog Dander: <0.1 kU/L
Elm IgE: <0.1 kU/L
IGE (IMMUNOGLOBULIN E), SERUM: 81 kU/L (ref <115)
Johnson Grass: <0.1 kU/L
Pecan/Hickory Tree IgE: <0.1 kU/L

## 2016-10-10 ENCOUNTER — Other Ambulatory Visit: Payer: Self-pay | Admitting: Pulmonary Disease

## 2016-10-10 MED ORDER — TIOTROPIUM BROMIDE MONOHYDRATE 18 MCG IN CAPS: ORAL_CAPSULE | RESPIRATORY_TRACT | 3 refills | Status: DC

## 2016-10-12 DIAGNOSIS — J449 Chronic obstructive pulmonary disease, unspecified: Secondary | ICD-10-CM | POA: Diagnosis not present

## 2016-10-14 LAB — ALPHA-1 ANTITRYPSIN PHENOTYPE: A-1 Antitrypsin: 172 mg/dL (ref 83–199)

## 2016-11-07 DIAGNOSIS — J441 Chronic obstructive pulmonary disease with (acute) exacerbation: Secondary | ICD-10-CM | POA: Diagnosis not present

## 2016-11-07 DIAGNOSIS — E782 Mixed hyperlipidemia: Secondary | ICD-10-CM | POA: Diagnosis not present

## 2016-11-07 DIAGNOSIS — R7301 Impaired fasting glucose: Secondary | ICD-10-CM | POA: Diagnosis not present

## 2016-11-07 DIAGNOSIS — E039 Hypothyroidism, unspecified: Secondary | ICD-10-CM | POA: Diagnosis not present

## 2016-11-07 DIAGNOSIS — I10 Essential (primary) hypertension: Secondary | ICD-10-CM | POA: Diagnosis not present

## 2016-11-07 DIAGNOSIS — Z6823 Body mass index (BMI) 23.0-23.9, adult: Secondary | ICD-10-CM | POA: Diagnosis not present

## 2016-11-09 DIAGNOSIS — E782 Mixed hyperlipidemia: Secondary | ICD-10-CM | POA: Diagnosis not present

## 2016-11-09 DIAGNOSIS — Z6824 Body mass index (BMI) 24.0-24.9, adult: Secondary | ICD-10-CM | POA: Diagnosis not present

## 2016-11-09 DIAGNOSIS — I10 Essential (primary) hypertension: Secondary | ICD-10-CM | POA: Diagnosis not present

## 2016-11-09 DIAGNOSIS — J9611 Chronic respiratory failure with hypoxia: Secondary | ICD-10-CM | POA: Diagnosis not present

## 2016-11-09 DIAGNOSIS — I5032 Chronic diastolic (congestive) heart failure: Secondary | ICD-10-CM | POA: Diagnosis not present

## 2016-11-09 DIAGNOSIS — E039 Hypothyroidism, unspecified: Secondary | ICD-10-CM | POA: Diagnosis not present

## 2016-11-09 DIAGNOSIS — J439 Emphysema, unspecified: Secondary | ICD-10-CM | POA: Diagnosis not present

## 2016-11-09 DIAGNOSIS — R7301 Impaired fasting glucose: Secondary | ICD-10-CM | POA: Diagnosis not present

## 2016-11-11 DIAGNOSIS — J449 Chronic obstructive pulmonary disease, unspecified: Secondary | ICD-10-CM | POA: Diagnosis not present

## 2016-11-17 ENCOUNTER — Ambulatory Visit: Payer: Medicare HMO | Admitting: Cardiovascular Disease

## 2016-11-24 DIAGNOSIS — R0602 Shortness of breath: Secondary | ICD-10-CM | POA: Diagnosis not present

## 2016-11-24 DIAGNOSIS — Z6823 Body mass index (BMI) 23.0-23.9, adult: Secondary | ICD-10-CM | POA: Diagnosis not present

## 2016-11-24 DIAGNOSIS — I2510 Atherosclerotic heart disease of native coronary artery without angina pectoris: Secondary | ICD-10-CM | POA: Diagnosis not present

## 2016-11-24 DIAGNOSIS — I10 Essential (primary) hypertension: Secondary | ICD-10-CM | POA: Diagnosis not present

## 2016-11-24 DIAGNOSIS — J441 Chronic obstructive pulmonary disease with (acute) exacerbation: Secondary | ICD-10-CM | POA: Diagnosis not present

## 2016-11-24 DIAGNOSIS — J9611 Chronic respiratory failure with hypoxia: Secondary | ICD-10-CM | POA: Diagnosis not present

## 2016-11-24 DIAGNOSIS — J439 Emphysema, unspecified: Secondary | ICD-10-CM | POA: Diagnosis not present

## 2016-11-24 DIAGNOSIS — I5032 Chronic diastolic (congestive) heart failure: Secondary | ICD-10-CM | POA: Diagnosis not present

## 2016-12-11 ENCOUNTER — Encounter: Payer: Self-pay | Admitting: *Deleted

## 2016-12-12 ENCOUNTER — Ambulatory Visit (INDEPENDENT_AMBULATORY_CARE_PROVIDER_SITE_OTHER): Payer: Medicare HMO | Admitting: Cardiovascular Disease

## 2016-12-12 ENCOUNTER — Encounter: Payer: Self-pay | Admitting: Cardiovascular Disease

## 2016-12-12 ENCOUNTER — Ambulatory Visit: Payer: Medicare HMO | Admitting: Cardiovascular Disease

## 2016-12-12 VITALS — BP 158/100 | HR 113 | Ht 66.0 in | Wt 153.0 lb

## 2016-12-12 DIAGNOSIS — E7800 Pure hypercholesterolemia, unspecified: Secondary | ICD-10-CM

## 2016-12-12 DIAGNOSIS — J449 Chronic obstructive pulmonary disease, unspecified: Secondary | ICD-10-CM

## 2016-12-12 DIAGNOSIS — I2510 Atherosclerotic heart disease of native coronary artery without angina pectoris: Secondary | ICD-10-CM

## 2016-12-12 DIAGNOSIS — I10 Essential (primary) hypertension: Secondary | ICD-10-CM

## 2016-12-12 MED ORDER — NITROGLYCERIN 0.4 MG SL SUBL: 0.4000 mg | SUBLINGUAL_TABLET | SUBLINGUAL | 3 refills | Status: AC | PRN

## 2016-12-12 MED ORDER — CARVEDILOL 3.125 MG PO TABS: 3.1250 mg | ORAL_TABLET | Freq: Two times a day (BID) | ORAL | 11 refills | Status: DC

## 2016-12-12 NOTE — Progress Notes (Signed)
SUBJECTIVE: The patient presents for routine cardiovascular follow-up. He underwent RCA stenting in May 2015. He had nonobstructive disease in the LAD and circumflex with normal left ventricular systolic function, EF 123456. He has severe COPD and is followed by pulmonology.  He denies chest pains. He has not had to use sublingual nitroglycerin since his last visit with me.  He has been struggling with a flare of COPD over the past 2 days. He used a rescue inhaler this morning and is tachycardic. He normally uses 2 and up to 2.5 L of oxygen around-the-clock.  He checks his blood pressure every other day at home and it ranges from 128-130/82. His blood pressure at his PCPs office 2 weeks ago was 126/86.  ECG performed in the office today shows sinus tachycardia with a left anterior fascicular block, heart rate 111 bpm.   Review of Systems: As per "subjective", otherwise negative.  Allergies  Allergen Reactions  . Penicillins     REACTION: rash    Current Outpatient Prescriptions  Medication Sig Dispense Refill  . albuterol (PROAIR HFA) 108 (90 BASE) MCG/ACT inhaler INHALE 2 PUFFS INTO THE LUNGS EVERY 4 (FOUR) HOURS AS NEEDED FOR WHEEZING OR SHORTNESS OF BREATH. 1 Inhaler 5  . aspirin 81 MG tablet Take 81 mg by mouth daily.      . budesonide-formoterol (SYMBICORT) 160-4.5 MCG/ACT inhaler Inhale 2 puffs into the lungs 2 (two) times daily. 3 Inhaler 3  . carvedilol (COREG) 3.125 MG tablet Take 1 tablet (3.125 mg total) by mouth 2 (two) times daily with a meal. 60 tablet 0  . fluticasone (FLONASE) 50 MCG/ACT nasal spray PLACE 1 SPRAY INTO BOTH NOSTRILS 2 (TWO) TIMES DAILY AS NEEDED. 16 g 5  . GuaiFENesin (MUCINEX PO) Take 400 mg by mouth 3 (three) times daily as needed (for congestion).     Marland Kitchen levothyroxine (SYNTHROID, LEVOTHROID) 88 MCG tablet Take 88 mcg by mouth daily before breakfast.    . lovastatin (MEVACOR) 20 MG tablet Take 20 mg by mouth at bedtime.      . montelukast  (SINGULAIR) 5 MG chewable tablet Chew 1 tablet (5 mg total) by mouth at bedtime. 30 tablet 2  . nitroGLYCERIN (NITROSTAT) 0.4 MG SL tablet Place 1 tablet (0.4 mg total) under the tongue every 5 (five) minutes as needed for chest pain (up to 3 doses). 25 tablet 3  . predniSONE (DELTASONE) 10 MG tablet Take 1 tablet by mouth daily.    Marland Kitchen tiotropium (SPIRIVA HANDIHALER) 18 MCG inhalation capsule PLACE 1 CAPSULE (18 MCG TOTAL) INTO INHALER AND INHALE DAILY. 90 capsule 3   No current facility-administered medications for this visit.     Past Medical History:  Diagnosis Date  . Anemia   . CAD (coronary artery disease)    a. 03/2014 - s/p DES to RCA, nonobstructive disease in LAD/Cx, EF 60%.  . Cataract, congenital    "left eye"  . COPD (chronic obstructive pulmonary disease) with emphysema ([place])    a. Chronic resp failure on home O2 (2L).  . H/O hiatal hernia   . HTN (hypertension)   . Hyperlipidemia   . Hypothyroidism   . Malignant neoplasm of bladder, part unspecified    "it was cured"     Past Surgical History:  Procedure Laterality Date  . BLADDER SURGERY  ` 2006   "bladder polyps"  . CARDIAC CATHETERIZATION  2008  . CORONARY ANGIOPLASTY WITH STENT PLACEMENT  04/16/2014   "1"  . LEFT  HEART CATHETERIZATION WITH CORONARY ANGIOGRAM N/A 04/16/2014   Procedure: LEFT HEART CATHETERIZATION WITH CORONARY ANGIOGRAM;  Surgeon: [name], MD;  Location: [facility] CATH LAB;  Service: Cardiovascular;  Laterality: N/A;  . PERCUTANEOUS CORONARY STENT INTERVENTION (PCI-S)  04/16/2014   Procedure: PERCUTANEOUS CORONARY STENT INTERVENTION (PCI-S);  Surgeon: [name], MD;  Location: [facility] CATH LAB;  Service: Cardiovascular;;  . TONSILLECTOMY    . TURP VAPORIZATION      Social History   Social History  . Marital status: Widowed    Spouse name: N/A  . Number of children: N/A  . Years of education: N/A   Occupational History  . retired    Social History Main Topics  . Smoking  status: Former Smoker    Packs/day: 1.50    Years: 48.00    Types: Cigarettes    Start date: 01/03/1949    Quit date: 11/20/1996  . Smokeless tobacco: Never Used  . Alcohol use No  . Drug use: No  . Sexual activity: Not Currently   Other Topics Concern  . Not on file   Social History Narrative   Originally from [place]. 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 No pets currently. Remote bird exposure. No mold exposure.      Vitals:   12/12/16 1123  BP: (!) 158/100  Pulse: (!) 113  SpO2: 97%  Weight: 153 lb (69.4 kg)  Height: 5\' 6"  (1.676 m)    PHYSICAL EXAM General: NAD, using O2 by nasal cannula HEENT: Left eye cataract Neck: No JVD, no thyromegaly. Lungs: Diminished throughout, no rales or wheezes. Poor air movement. CV: Very distant heart tones, tachycardic, no murmur. No pretibial or periankle edema.    Abdomen: Soft, nontender, no distention.  Neurologic: Alert and oriented x 3.  Psych: Normal affect. Skin: Normal.     ECG: Most recent ECG reviewed.      ASSESSMENT AND PLAN:  1. CAD s/p RCA DES in 03/2014: Stable ischemic heart disease. Continue ASA, Coreg, and lovastatin.  2. Essential HTN: Elevated today but normal at home and at PCP's office. No changes.  3. COPD: Has a mild exacerbation. No prominent wheezes. Follows with pulmonary.  4. Hyperlipidemia: Will obtain copy of lipids from PCP. Continue statin therapy.  Dispo: f/u 1 year.  Kate Sable, M.D., F.A.C.C.

## 2016-12-12 NOTE — Patient Instructions (Signed)

## 2017-01-12 DIAGNOSIS — J449 Chronic obstructive pulmonary disease, unspecified: Secondary | ICD-10-CM | POA: Diagnosis not present

## 2017-01-16 DIAGNOSIS — J441 Chronic obstructive pulmonary disease with (acute) exacerbation: Secondary | ICD-10-CM | POA: Diagnosis not present

## 2017-01-16 DIAGNOSIS — I10 Essential (primary) hypertension: Secondary | ICD-10-CM | POA: Diagnosis not present

## 2017-01-16 DIAGNOSIS — J9611 Chronic respiratory failure with hypoxia: Secondary | ICD-10-CM | POA: Diagnosis not present

## 2017-01-16 DIAGNOSIS — I5032 Chronic diastolic (congestive) heart failure: Secondary | ICD-10-CM | POA: Diagnosis not present

## 2017-01-16 DIAGNOSIS — R0602 Shortness of breath: Secondary | ICD-10-CM | POA: Diagnosis not present

## 2017-01-16 DIAGNOSIS — I2510 Atherosclerotic heart disease of native coronary artery without angina pectoris: Secondary | ICD-10-CM | POA: Diagnosis not present

## 2017-01-16 DIAGNOSIS — Z6822 Body mass index (BMI) 22.0-22.9, adult: Secondary | ICD-10-CM | POA: Diagnosis not present

## 2017-01-18 DIAGNOSIS — R69 Illness, unspecified: Secondary | ICD-10-CM | POA: Diagnosis not present

## 2017-01-23 ENCOUNTER — Ambulatory Visit: Payer: Medicare HMO | Admitting: Pulmonary Disease

## 2017-01-30 ENCOUNTER — Telehealth: Payer: Self-pay | Admitting: Pulmonary Disease

## 2017-01-30 ENCOUNTER — Encounter: Payer: Self-pay | Admitting: Pulmonary Disease

## 2017-01-30 MED ORDER — ALBUTEROL SULFATE HFA 108 (90 BASE) MCG/ACT IN AERS: INHALATION_SPRAY | RESPIRATORY_TRACT | 1 refills | Status: AC

## 2017-01-30 MED ORDER — TIOTROPIUM BROMIDE MONOHYDRATE 18 MCG IN CAPS: ORAL_CAPSULE | RESPIRATORY_TRACT | 1 refills | Status: AC

## 2017-01-30 MED ORDER — BUDESONIDE-FORMOTEROL FUMARATE 160-4.5 MCG/ACT IN AERO: 2.0000 | INHALATION_SPRAY | Freq: Two times a day (BID) | RESPIRATORY_TRACT | 1 refills | Status: AC

## 2017-01-30 NOTE — Telephone Encounter (Signed)
Last ov w/ JN 11.17.17: Patient Instructions   Call me if you have any new breathing problems before your next appointment.  Remember to take the new allergy medicine at night before bed. Call me if you have any problems with the medication.  I will see you back in 3 months or sooner if needed.   TESTS ORDERED: 1. Serum Alpha-1 Antitrypsin Phenotype & RAST Panel today.    Pt noshowed for the 3.6.18 appt with JN and is scheduled to come to the office on 6.1.18 Pt stated that his insurance stopped paying for the Symbicort so he has decided to get all of his inhalers from the [place] since they will be free  Pt apologized for missing his previous appt - he relies on family for transportation Rx's printed for JN to sign - fax to:  [facility] Attn Dr Royce Macadamia F: [phone] P: [phone]

## 2017-01-31 DIAGNOSIS — E782 Mixed hyperlipidemia: Secondary | ICD-10-CM | POA: Diagnosis not present

## 2017-01-31 DIAGNOSIS — E039 Hypothyroidism, unspecified: Secondary | ICD-10-CM | POA: Diagnosis not present

## 2017-01-31 DIAGNOSIS — C679 Malignant neoplasm of bladder, unspecified: Secondary | ICD-10-CM | POA: Diagnosis not present

## 2017-01-31 DIAGNOSIS — J439 Emphysema, unspecified: Secondary | ICD-10-CM | POA: Diagnosis not present

## 2017-01-31 DIAGNOSIS — J9611 Chronic respiratory failure with hypoxia: Secondary | ICD-10-CM | POA: Diagnosis not present

## 2017-01-31 DIAGNOSIS — I10 Essential (primary) hypertension: Secondary | ICD-10-CM | POA: Diagnosis not present

## 2017-01-31 DIAGNOSIS — H269 Unspecified cataract: Secondary | ICD-10-CM | POA: Diagnosis not present

## 2017-01-31 DIAGNOSIS — I2510 Atherosclerotic heart disease of native coronary artery without angina pectoris: Secondary | ICD-10-CM | POA: Diagnosis not present

## 2017-01-31 NOTE — Telephone Encounter (Addendum)
Rx's signed by Durene Cal and faxed to the [facility] Pt is aware Nothing further needed; will sign off

## 2017-02-09 DIAGNOSIS — J449 Chronic obstructive pulmonary disease, unspecified: Secondary | ICD-10-CM | POA: Diagnosis not present

## 2017-03-12 DIAGNOSIS — J449 Chronic obstructive pulmonary disease, unspecified: Secondary | ICD-10-CM | POA: Diagnosis not present

## 2017-04-11 DIAGNOSIS — J449 Chronic obstructive pulmonary disease, unspecified: Secondary | ICD-10-CM | POA: Diagnosis not present

## 2017-04-20 ENCOUNTER — Telehealth: Payer: Self-pay | Admitting: Pulmonary Disease

## 2017-04-20 ENCOUNTER — Ambulatory Visit (INDEPENDENT_AMBULATORY_CARE_PROVIDER_SITE_OTHER)
Admission: RE | Admit: 2017-04-20 | Discharge: 2017-04-20 | Disposition: A | Discharge status: Home / Self Care | Payer: Medicare HMO | Source: Ambulatory Visit | Attending: Pulmonary Disease | Admitting: Pulmonary Disease

## 2017-04-20 ENCOUNTER — Ambulatory Visit (INDEPENDENT_AMBULATORY_CARE_PROVIDER_SITE_OTHER): Payer: Medicare HMO | Admitting: Pulmonary Disease

## 2017-04-20 ENCOUNTER — Encounter: Payer: Self-pay | Admitting: Pulmonary Disease

## 2017-04-20 VITALS — BP 140/70 | HR 100 | Ht 66.0 in | Wt 152.0 lb

## 2017-04-20 DIAGNOSIS — J9611 Chronic respiratory failure with hypoxia: Secondary | ICD-10-CM

## 2017-04-20 DIAGNOSIS — R0600 Dyspnea, unspecified: Secondary | ICD-10-CM

## 2017-04-20 DIAGNOSIS — J302 Other seasonal allergic rhinitis: Secondary | ICD-10-CM

## 2017-04-20 DIAGNOSIS — J449 Chronic obstructive pulmonary disease, unspecified: Secondary | ICD-10-CM | POA: Diagnosis not present

## 2017-04-20 DIAGNOSIS — R0609 Other forms of dyspnea: Secondary | ICD-10-CM | POA: Diagnosis not present

## 2017-04-20 MED ORDER — TIOTROPIUM BROMIDE MONOHYDRATE 2.5 MCG/ACT IN AERS: 2.0000 | INHALATION_SPRAY | Freq: Every day | RESPIRATORY_TRACT | 0 refills | Status: DC

## 2017-04-20 NOTE — Progress Notes (Signed)
Patient seen in the office today and instructed on use of Spiriva respimat 2.5.  Patient expressed understanding and demonstrated technique.

## 2017-04-20 NOTE — Addendum Note (Signed)
Addended by: Tyson Dense on: 04/20/2017 04:41 PM   Modules accepted: Orders

## 2017-04-20 NOTE — Progress Notes (Signed)
Subjective:    Patient ID: Willie Williamson, male    DOB: Apr 03, 1931, 83 y.o.   MRN: [id number]  C.C.:  Follow-up for Moderate-Severe COPD, Chronic Hypoxic Respiratory Failure, Chronic Steroid/Prednisone Use, Chronic Seasonal Allergic Rhinitis.  HPI  Moderate-severe COPD: Started on Singulair 5 mg at last appointment. Patient on chronic prednisone 10 mg daily along with Symbicort 160/4.5 with spacer and Spiriva. Suspected asthmatic component. He reports since his last appointment he has been seen at least twice for an exacerbation and was prescribed a "cortisone shot" and a Z-pak as well as a Medrol Dose Pak. He does feel his dyspnea is slowly worsening. He reports no significant improvement on Singulair after he took it for 2 months so he stopped taking it. He reports minimal wheezing. He reports intermittent coughing that is only intermittently productive of mucus. He is using his rescue medication sporadically. No nocturnal awakenings with any dyspnea.   Chronic hypoxic respiratory failure: Previously prescribed oxygen at 2-3 L/m 24 hours daily.  Chronic steroid/prednisone use: Patient has been on steroid therapy for approximately 2 years. On baseline dose of 10 mg daily.  Chronic seasonal allergic rhinitis: Started on Singulair at last appointment. Previously taking Claritin intermittently and Flonase seasonally. Recommended using nasal saline rinse and Flonase daily to help with increased sinus congestion at last appointment. He reports increased sinus congestion over the last 3-4 weeks but hasn't been using flonase.   Review of Systems  No chest tightness, pain, or pressure. Reports very rare reflux and no morning brash water taste. No fever or chills.   Allergies  Allergen Reactions  . Penicillins     REACTION: rash    Current Outpatient Prescriptions on File Prior to Visit  Medication Sig Dispense Refill  . albuterol (PROAIR HFA) 108 (90 Base) MCG/ACT inhaler INHALE 2 PUFFS INTO THE  LUNGS EVERY 4 (FOUR) HOURS AS NEEDED FOR WHEEZING OR SHORTNESS OF BREATH. 3 Inhaler 1  . aspirin 81 MG tablet Take 81 mg by mouth daily.      . budesonide-formoterol (SYMBICORT) 160-4.5 MCG/ACT inhaler Inhale 2 puffs into the lungs 2 (two) times daily. 3 Inhaler 1  . carvedilol (COREG) 3.125 MG tablet Take 1 tablet (3.125 mg total) by mouth 2 (two) times daily with a meal. 60 tablet 11  . fluticasone (FLONASE) 50 MCG/ACT nasal spray PLACE 1 SPRAY INTO BOTH NOSTRILS 2 (TWO) TIMES DAILY AS NEEDED. 16 g 5  . GuaiFENesin (MUCINEX PO) Take 400 mg by mouth 3 (three) times daily as needed (for congestion).     Marland Kitchen levothyroxine (SYNTHROID, LEVOTHROID) 88 MCG tablet Take 88 mcg by mouth daily before breakfast.    . lovastatin (MEVACOR) 20 MG tablet Take 20 mg by mouth at bedtime.      . montelukast (SINGULAIR) 5 MG chewable tablet Chew 1 tablet (5 mg total) by mouth at bedtime. 30 tablet 2  . nitroGLYCERIN (NITROSTAT) 0.4 MG SL tablet Place 1 tablet (0.4 mg total) under the tongue every 5 (five) minutes as needed for chest pain (up to 3 doses). 25 tablet 3  . predniSONE (DELTASONE) 10 MG tablet Take 1 tablet by mouth daily.    Marland Kitchen tiotropium (SPIRIVA HANDIHALER) 18 MCG inhalation capsule PLACE 1 CAPSULE (18 MCG TOTAL) INTO INHALER AND INHALE DAILY. 90 capsule 1   No current facility-administered medications on file prior to visit.     Past Medical History:  Diagnosis Date  . Anemia   . CAD (coronary artery disease)  a. 03/2014 - s/p DES to RCA, nonobstructive disease in LAD/Cx, EF 60%.  . Cataract, congenital    "left eye"  . COPD (chronic obstructive pulmonary disease) with emphysema ([place])    a. Chronic resp failure on home O2 (2L).  . H/O hiatal hernia   . HTN (hypertension)   . Hyperlipidemia   . Hypothyroidism   . Malignant neoplasm of bladder, part unspecified    "it was cured"     Past Surgical History:  Procedure Laterality Date  . BLADDER SURGERY  ` 2006   "bladder polyps"  .  CARDIAC CATHETERIZATION  2008  . CORONARY ANGIOPLASTY WITH STENT PLACEMENT  04/16/2014   "1"  . LEFT HEART CATHETERIZATION WITH CORONARY ANGIOGRAM N/A 04/16/2014   Procedure: LEFT HEART CATHETERIZATION WITH CORONARY ANGIOGRAM;  Surgeon: [name], MD;  Location[facility] CATH LAB;  Service: Cardiovascular;  Laterality: N/A;  . PERCUTANEOUS CORONARY STENT INTERVENTION (PCI-S)  04/16/2014   Procedure: PERCUTANEOUS CORONARY STENT INTERVENTION (PCI-S);  Surgeon: [name], MD;  Location: [facility] CATH LAB;  Service: Cardiovascular;;  . TONSILLECTOMY    . TURP VAPORIZATION      Family History  Problem Relation Age of Onset  . Bone cancer Mother   . Hypertension Father   . Lung disease Neg Hx     Social History   Social History  . Marital status: Widowed    Spouse name: N/A  . Number of children: N/A  . Years of education: N/A   Occupational History  . retired    Social History Main Topics  . Smoking status: Former Smoker    Packs/day: 1.50    Years: 48.00    Types: Cigarettes    Start date: 01/03/1949    Quit date: 11/20/1996  . Smokeless tobacco: Never Used  . Alcohol use No  . Drug use: No  . Sexual activity: Not Currently   Other Topics Concern  . None   Social History Narrative   Originally from [place]. 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 No pets currently. Remote bird exposure. No mold exposure.       Objective:   Physical Exam BP 140/70 (BP Location: Right Arm, Patient Position: Sitting, Cuff Size: Normal)   Pulse 100   Ht 5\' 6"  (1.676 m)   Wt 152 lb (68.9 kg)   SpO2 95%   BMI 24.53 kg/m   General:  Awake. Alert. No acute distress. Accompanied by a friend today. Integument:  Warm & dry. No rash on exposed skin.  Extremities:  No cyanosis or clubbing.  HEENT:  Moist mucus membranes. Minimal nasal turbinate swelling. No oral  ulcers. Cardiovascular:  Regular rate. No edema. Normal S1 & S2. Pulmonary:  Symmetrically decreased breath sounds that are unchanged. No wheezing appreciated. Normal work of breathing on supplemental oxygen. Abdomen: Soft. Normal bowel sounds. Nondistended.  Musculoskeletal:  Normal bulk and tone. No joint deformity or effusion appreciated.  PFT 03/07/07: FVC 3.88 L (101%) FEV1 1.39 L (55%) FEV1/FVC 0.36 FEF 25-75 0.33 L (14%) positive bronchodilator response TLC 7.19 L (145%) this ERV 130% ERV 75% DLCO corrected 62%  6MWT 07/24/06: Walked 1138ft / Baseline Sat 94% on RA / Nadir Sat 91% on RA  LABS 10/06/16 Alpha-1 antitrypsin: MM (172) IgE: 81 RAST panel: Negative    Assessment & Plan:  83 y.o.  male with moderate-severe COPD, chronic hypoxic respiratory failure, chronic seasonal allergic rhinitis, & chronic steroid use. Patient is experiencing worsening dyspnea which I feel is due to his frequent exacerbations the Spring. Certainly this is due to the increased environmental pollen counts. We did discuss the natural progression of his lung disease and aging of his lungs as possible contributing factors. I do suspect he may benefit from a longer steroid taper in the event of a flare. I am checking a chest x-ray as well as a walk test to ensure there is no other underlying parenchymal process that could be contributing to his symptoms. I'm also switching the patient over to a Respimat device to see if there may be better drug delivery given the severity of his lung disease. I instructed the patient contact my office if he had any further questions or concerns before his next appointment.  1. Dyspnea on exertion: Checking short walk test today. Checking chest x-ray PA/LAT today.  2. Moderate-severe COPD: Suspect asthma overlap. Continuing patient on Symbicort 160/4.5. Patient given sample of Spiriva Respimat to try in place of his HandiHaler. He will notify me for prescription if this seems to  be more effective. Discontinuing Singulair. 3. Chronic hypoxic respiratory failure: Continuing on oxygen at 2-3 L/m 24 hours daily as previously prescribed. 4. Chronic seasonal allergic rhinitis: Continuing patient on Flonase and Claritin. Recommended trying to use daily given increased symptoms. 5. Chronic prednisone/steroid use: Continuing prednisone 10 mg daily. Suspect patient may require a longer prednisone taper during his flares of his underlying COPD. 6. Health maintenance: Status post influenza vaccine September 2017, Prevnar September 2015, & Pneumovax October 2011. 7. Follow-up: Return to clinic in 3 months or sooner if needed.  Sonia Baller Ashok Cordia, M.D. [facility] Pulmonary & Critical Care Pager:  [phone] After 3pm or if no response, call [phone] 10:50 AM 04/20/17

## 2017-04-20 NOTE — Addendum Note (Signed)
Addended by: Tyson Dense on: 04/20/2017 11:40 AM   Modules accepted: Orders

## 2017-04-20 NOTE — Patient Instructions (Signed)
   Use the Spiriva Respimat we are giving you today in place of your HandiHaler. Inhale 2 twists off of the Respimat once daily. Call my office if this seems to work better and we will send in a prescription to the [facility] in [place] for you.  You may need a longer taper of steroids if you have another flare of your COPD. Call and let me know if this happens again.  TESTS ORDERED: 1. CXR PA/LAT TODAY

## 2017-04-20 NOTE — Telephone Encounter (Signed)
Pt was informed Dr. Ashok Cordia wanted him to have a 6MWT with oxygen titration prior to next appointment. Appointment was scheduled for 08/08/18 at 9 prior to his appointment at 1015 with JN. He was made aware an instructional sheet will be sent to his address on file regarding the 6MWT. He verbalized understanding and did not have any questions. Instructional sheet was placed in outgoing mail. Nothing further is needed.

## 2017-04-23 NOTE — Progress Notes (Signed)
Spoke with patient and informed him of results. Pt verbalized understanding and did not have any questions. Nothing further is needed.

## 2017-04-26 ENCOUNTER — Telehealth: Payer: Self-pay | Admitting: Pulmonary Disease

## 2017-04-26 MED ORDER — TIOTROPIUM BROMIDE MONOHYDRATE 2.5 MCG/ACT IN AERS: 2.0000 | INHALATION_SPRAY | Freq: Every day | RESPIRATORY_TRACT | 11 refills | Status: DC

## 2017-04-26 NOTE — Telephone Encounter (Signed)
Pt needing a prescription sent to the [facility] in [place], [place] for his Spiriva Respimat 2.60mcg Tolerated well and per last OV pt was advised that if tolerated we could send Rx to his [facility] in [place].  Fax # [phone] Attnl: Dr. Kellie Shropshire  Patient aware that we are faxing this today. Nothing further needed.

## 2017-05-08 DIAGNOSIS — E039 Hypothyroidism, unspecified: Secondary | ICD-10-CM | POA: Diagnosis not present

## 2017-05-08 DIAGNOSIS — I10 Essential (primary) hypertension: Secondary | ICD-10-CM | POA: Diagnosis not present

## 2017-05-08 DIAGNOSIS — R7301 Impaired fasting glucose: Secondary | ICD-10-CM | POA: Diagnosis not present

## 2017-05-08 DIAGNOSIS — I5032 Chronic diastolic (congestive) heart failure: Secondary | ICD-10-CM | POA: Diagnosis not present

## 2017-05-08 DIAGNOSIS — E782 Mixed hyperlipidemia: Secondary | ICD-10-CM | POA: Diagnosis not present

## 2017-05-11 DIAGNOSIS — C679 Malignant neoplasm of bladder, unspecified: Secondary | ICD-10-CM | POA: Diagnosis not present

## 2017-05-11 DIAGNOSIS — J9611 Chronic respiratory failure with hypoxia: Secondary | ICD-10-CM | POA: Diagnosis not present

## 2017-05-11 DIAGNOSIS — I10 Essential (primary) hypertension: Secondary | ICD-10-CM | POA: Diagnosis not present

## 2017-05-11 DIAGNOSIS — Z6823 Body mass index (BMI) 23.0-23.9, adult: Secondary | ICD-10-CM | POA: Diagnosis not present

## 2017-05-11 DIAGNOSIS — E782 Mixed hyperlipidemia: Secondary | ICD-10-CM | POA: Diagnosis not present

## 2017-05-11 DIAGNOSIS — E039 Hypothyroidism, unspecified: Secondary | ICD-10-CM | POA: Diagnosis not present

## 2017-05-11 DIAGNOSIS — I2510 Atherosclerotic heart disease of native coronary artery without angina pectoris: Secondary | ICD-10-CM | POA: Diagnosis not present

## 2017-05-11 DIAGNOSIS — J439 Emphysema, unspecified: Secondary | ICD-10-CM | POA: Diagnosis not present

## 2017-05-16 ENCOUNTER — Ambulatory Visit (INDEPENDENT_AMBULATORY_CARE_PROVIDER_SITE_OTHER): Payer: Medicare HMO | Admitting: Urology

## 2017-05-16 ENCOUNTER — Other Ambulatory Visit (HOSPITAL_COMMUNITY)
Admission: RE | Admit: 2017-05-16 | Discharge: 2017-05-16 | Disposition: A | Discharge status: Home / Self Care | Payer: Medicare HMO | Source: Other Acute Inpatient Hospital | Attending: Urology | Admitting: Urology

## 2017-05-16 DIAGNOSIS — R310 Gross hematuria: Secondary | ICD-10-CM | POA: Diagnosis not present

## 2017-05-16 DIAGNOSIS — C679 Malignant neoplasm of bladder, unspecified: Secondary | ICD-10-CM | POA: Diagnosis not present

## 2017-05-16 DIAGNOSIS — C678 Malignant neoplasm of overlapping sites of bladder: Secondary | ICD-10-CM | POA: Diagnosis not present

## 2017-05-19 LAB — URINE CULTURE

## 2017-06-11 DIAGNOSIS — J449 Chronic obstructive pulmonary disease, unspecified: Secondary | ICD-10-CM | POA: Diagnosis not present

## 2017-06-19 DIAGNOSIS — H43811 Vitreous degeneration, right eye: Secondary | ICD-10-CM | POA: Diagnosis not present

## 2017-06-19 DIAGNOSIS — H2511 Age-related nuclear cataract, right eye: Secondary | ICD-10-CM | POA: Diagnosis not present

## 2017-06-19 DIAGNOSIS — H40011 Open angle with borderline findings, low risk, right eye: Secondary | ICD-10-CM | POA: Diagnosis not present

## 2017-06-27 ENCOUNTER — Ambulatory Visit (INDEPENDENT_AMBULATORY_CARE_PROVIDER_SITE_OTHER): Payer: Medicare HMO | Admitting: Urology

## 2017-06-27 DIAGNOSIS — C678 Malignant neoplasm of overlapping sites of bladder: Secondary | ICD-10-CM

## 2017-07-18 DIAGNOSIS — Z6823 Body mass index (BMI) 23.0-23.9, adult: Secondary | ICD-10-CM | POA: Diagnosis not present

## 2017-07-18 DIAGNOSIS — J439 Emphysema, unspecified: Secondary | ICD-10-CM | POA: Diagnosis not present

## 2017-07-18 DIAGNOSIS — S29011A Strain of muscle and tendon of front wall of thorax, initial encounter: Secondary | ICD-10-CM | POA: Diagnosis not present

## 2017-07-24 DIAGNOSIS — R69 Illness, unspecified: Secondary | ICD-10-CM | POA: Diagnosis not present

## 2017-08-03 DIAGNOSIS — E782 Mixed hyperlipidemia: Secondary | ICD-10-CM | POA: Diagnosis not present

## 2017-08-03 DIAGNOSIS — E559 Vitamin D deficiency, unspecified: Secondary | ICD-10-CM | POA: Diagnosis not present

## 2017-08-03 DIAGNOSIS — I10 Essential (primary) hypertension: Secondary | ICD-10-CM | POA: Diagnosis not present

## 2017-08-06 ENCOUNTER — Telehealth: Payer: Self-pay | Admitting: Pulmonary Disease

## 2017-08-06 NOTE — Telephone Encounter (Signed)
Spoke with patient. He stated that he was able to find someone to get him here by 9am. No changes needed.

## 2017-08-07 DIAGNOSIS — E782 Mixed hyperlipidemia: Secondary | ICD-10-CM | POA: Diagnosis not present

## 2017-08-07 DIAGNOSIS — Z23 Encounter for immunization: Secondary | ICD-10-CM | POA: Diagnosis not present

## 2017-08-07 DIAGNOSIS — I2510 Atherosclerotic heart disease of native coronary artery without angina pectoris: Secondary | ICD-10-CM | POA: Diagnosis not present

## 2017-08-07 DIAGNOSIS — I5032 Chronic diastolic (congestive) heart failure: Secondary | ICD-10-CM | POA: Diagnosis not present

## 2017-08-07 DIAGNOSIS — J439 Emphysema, unspecified: Secondary | ICD-10-CM | POA: Diagnosis not present

## 2017-08-07 DIAGNOSIS — I10 Essential (primary) hypertension: Secondary | ICD-10-CM | POA: Diagnosis not present

## 2017-08-07 DIAGNOSIS — J9611 Chronic respiratory failure with hypoxia: Secondary | ICD-10-CM | POA: Diagnosis not present

## 2017-08-07 DIAGNOSIS — E559 Vitamin D deficiency, unspecified: Secondary | ICD-10-CM | POA: Diagnosis not present

## 2017-08-07 DIAGNOSIS — Z0001 Encounter for general adult medical examination with abnormal findings: Secondary | ICD-10-CM | POA: Diagnosis not present

## 2017-08-07 DIAGNOSIS — E039 Hypothyroidism, unspecified: Secondary | ICD-10-CM | POA: Diagnosis not present

## 2017-08-08 ENCOUNTER — Ambulatory Visit (INDEPENDENT_AMBULATORY_CARE_PROVIDER_SITE_OTHER): Payer: Medicare HMO | Admitting: Pulmonary Disease

## 2017-08-08 ENCOUNTER — Encounter: Payer: Self-pay | Admitting: Pulmonary Disease

## 2017-08-08 ENCOUNTER — Ambulatory Visit (INDEPENDENT_AMBULATORY_CARE_PROVIDER_SITE_OTHER): Payer: Medicare HMO | Admitting: *Deleted

## 2017-08-08 VITALS — BP 142/80 | HR 93 | Ht 67.0 in | Wt 152.4 lb

## 2017-08-08 DIAGNOSIS — R0600 Dyspnea, unspecified: Secondary | ICD-10-CM | POA: Diagnosis not present

## 2017-08-08 DIAGNOSIS — J302 Other seasonal allergic rhinitis: Secondary | ICD-10-CM | POA: Diagnosis not present

## 2017-08-08 DIAGNOSIS — J9611 Chronic respiratory failure with hypoxia: Secondary | ICD-10-CM

## 2017-08-08 DIAGNOSIS — J449 Chronic obstructive pulmonary disease, unspecified: Secondary | ICD-10-CM

## 2017-08-08 NOTE — Patient Instructions (Addendum)
   Continue using her inhalers as prescribed.  We are putting in an order for [facility] to evaluate you for a portable oxygen concentrator.  Continue using oxygen at 2 L/m whenever you are walking or exerting yourself. You do not need oxygen if you're just sitting or resting.  Call our office if you have any new breathing problems or questions before your next appointment.

## 2017-08-08 NOTE — Progress Notes (Signed)
SIX MIN WALK 08/08/2017 08/08/2017 04/20/2017 09/11/2012 09/11/2012  Medications - proair, synthroid and mevacor taken at 5:30 am - - -  Supplimental Oxygen during Test? (L/min) Yes No No - No  O2 Flow Rate 2 - - - -  Type Continuous - - - -  Laps 3 3 - - -  Partial Lap (in Meters) 0 0 - - -  Baseline BP (sitting) - 140/80 - - -  Baseline Heartrate - 100 - - -  Baseline Dyspnea (Borg Scale) - 2 - - -  Baseline Fatigue (Borg Scale) - 1 - - -  Baseline SPO2 - 95 - - -  BP (sitting) 160/94 - - - -  Heartrate 123 - - - -  Dyspnea (Borg Scale) 3 - - - -  Fatigue (Borg Scale) 3 - - - -  SPO2 94 - - - -  BP (sitting) 162/90 - - - -  Heartrate 108 - - - -  SPO2 98 - - - -  Stopped or Paused before Six Minutes Yes - - - -  Other Symptoms at end of Exercise stopped after 3:30 for rest with c/o SOB and CP. Rested x 2 min.  - - - -  Distance Completed 144 144 - - -  Tech Comments: pt maintained sats above 90% while on 2lpm cont. o2 for 2 min and 30 sec     total meters: 288  walked on ra x 3 laps then sats dropped to 86%-pt placed on o2 at 2lpm continuous flow and after approx 2 min sats increased to 99% Pt walked at moderate pace with steady gait.  addendom to below:  of 85% ra.  Pt placed on 2lpm with sat 94% Pt walked short distance to exam door and back to chair with sat

## 2017-08-08 NOTE — Progress Notes (Signed)
Subjective:    Patient ID: Willie Williamson, male    DOB: 20-Apr-1931, 83 y.o.   MRN: [id number]  C.C.:  Follow-up for Moderate-Severe COPD, Chronic Hypoxic Respiratory Failure, Chronic Steroid/Prednisone Use, Chronic Seasonal Allergic Rhinitis.  HPI  Moderate-severe COPD: Suspect asthma overlap. No symptomatic benefit from Singulair. Prescribed Symbicort. Given sample of Spiriva Respimat to try in place of HandiHaler at last appointment. He tried to get the Respimat device through the [facility] out of [place], [place],but was unable to coordinate it. He has continued using his HandiHaler. He did feel the Respimat was working better. He reports his baseline dyspnea. He denies any coughing. Minimal wheezing.   Chronic hypoxic respiratory failure: Patient Previously prescribed 2-3 L/m 24 hours a day. Demonstrated on oxygen requirement of 2 L/m with exertion today.  Chronic steroid/prednisone use: Patient has been on prednisone for approximately 2 years. Baseline dose of 10 mg daily. He reports his PCP increased his Prednisone to 15mg  daily yesterday but the patient isn't sure why.   Chronic seasonal allergic rhinitis: No symptomatically benefit to Singulair. Prescribed Claritin and Flonase for seasonal use. He denies any sinus congestion or drainage. He is using Flonase. He is also using Claritin.   Review of Systems  No chest pain or pressure. No fever or chills. No abdominal pain or nausea recently.   Allergies  Allergen Reactions  . Penicillins     REACTION: rash    Current Outpatient Prescriptions on File Prior to Visit  Medication Sig Dispense Refill  . albuterol (PROAIR HFA) 108 (90 Base) MCG/ACT inhaler INHALE 2 PUFFS INTO THE LUNGS EVERY 4 (FOUR) HOURS AS NEEDED FOR WHEEZING OR SHORTNESS OF BREATH. 3 Inhaler 1  . aspirin 81 MG tablet Take 81 mg by mouth daily.      . budesonide-formoterol (SYMBICORT) 160-4.5 MCG/ACT inhaler Inhale 2 puffs into the lungs 2 (two) times daily. 3 Inhaler 1  .  carvedilol (COREG) 3.125 MG tablet Take 1 tablet (3.125 mg total) by mouth 2 (two) times daily with a meal. 60 tablet 11  . fluticasone (FLONASE) 50 MCG/ACT nasal spray PLACE 1 SPRAY INTO BOTH NOSTRILS 2 (TWO) TIMES DAILY AS NEEDED. 16 g 5  . GuaiFENesin (MUCINEX PO) Take 400 mg by mouth 3 (three) times daily as needed (for congestion).     Marland Kitchen levothyroxine (SYNTHROID, LEVOTHROID) 88 MCG tablet Take 88 mcg by mouth daily before breakfast.    . lovastatin (MEVACOR) 20 MG tablet Take 20 mg by mouth at bedtime.      . nitroGLYCERIN (NITROSTAT) 0.4 MG SL tablet Place 1 tablet (0.4 mg total) under the tongue every 5 (five) minutes as needed for chest pain (up to 3 doses). 25 tablet 3  . predniSONE (DELTASONE) 10 MG tablet Take 1 tablet by mouth daily.    Marland Kitchen tiotropium (SPIRIVA HANDIHALER) 18 MCG inhalation capsule PLACE 1 CAPSULE (18 MCG TOTAL) INTO INHALER AND INHALE DAILY. 90 capsule 1  . Tiotropium Bromide Monohydrate (SPIRIVA RESPIMAT) 2.5 MCG/ACT AERS Inhale 2 puffs into the lungs daily. 1 Inhaler 11  . Tiotropium Bromide Monohydrate (SPIRIVA RESPIMAT) 2.5 MCG/ACT AERS Inhale 2 puffs into the lungs daily. 1 Inhaler 11   No current facility-administered medications on file prior to visit.     Past Medical History:  Diagnosis Date  . Anemia   . CAD (coronary artery disease)    a. 03/2014 - s/p DES to RCA, nonobstructive disease in LAD/Cx, EF 60%.  . Cataract, congenital    "left eye"  .  COPD (chronic obstructive pulmonary disease) with emphysema ([facility])    a. Chronic resp failure on home O2 (2L).  . H/O hiatal hernia   . HTN (hypertension)   . Hyperlipidemia   . Hypothyroidism   . Malignant neoplasm of bladder, part unspecified    "it was cured"     Past Surgical History:  Procedure Laterality Date  . BLADDER SURGERY  ` 2006   "bladder polyps"  . CARDIAC CATHETERIZATION  2008  . CORONARY ANGIOPLASTY WITH STENT PLACEMENT  04/16/2014   "1"  . LEFT HEART CATHETERIZATION WITH CORONARY  ANGIOGRAM N/A 04/16/2014   Procedure: LEFT HEART CATHETERIZATION WITH CORONARY ANGIOGRAM;  Surgeon: [name], MD;  Location: [facility] CATH LAB;  Service: Cardiovascular;  Laterality: N/A;  . PERCUTANEOUS CORONARY STENT INTERVENTION (PCI-S)  04/16/2014   Procedure: PERCUTANEOUS CORONARY STENT INTERVENTION (PCI-S);  Surgeon: [name], MD;  Location: [facility] CATH LAB;  Service: Cardiovascular;;  . TONSILLECTOMY    . TURP VAPORIZATION      Family History  Problem Relation Age of Onset  . Bone cancer Mother   . Hypertension Father   . Lung disease Neg Hx     Social History   Social History  . Marital status: Widowed    Spouse name: N/A  . Number of children: N/A  . Years of education: N/A   Occupational History  . retired    Social History Main Topics  . Smoking status: Former Smoker    Packs/day: 1.50    Years: 48.00    Types: Cigarettes    Start date: 01/03/1949    Quit date: 11/20/1996  . Smokeless tobacco: Never Used  . Alcohol use No  . Drug use: No  . Sexual activity: Not Currently   Other Topics Concern  . None   Social History Narrative   Originally from [place]. In the [employer] he was in [place], [place], & [place]. 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 No pets currently. Remote bird exposure. No mold exposure.       Objective:   Physical Exam BP (!) 142/80 (BP Location: Left Arm, Cuff Size: Normal)   Pulse 93   Ht 5\' 7"  (1.702 m)   Wt 152 lb 6 oz (69.1 kg)   SpO2 95%   BMI 23.87 kg/m   General:  Awake. Comfortable. No distress. Integument:  Warm & dry. No rash on exposed skin. Bruising of various ages on exposed skin of hands. Extremities:  No cyanosis.  Lymphatics: No appreciated cervical or supraclavicular lymphadenopathy. HEENT:  No nasal turbinate swelling. Mild dry mucosa in left nare. No oral ulcers. Cardiovascular:  Regular rate. No edema. Unable to appreciate  JVD. Pulmonary:  Symmetrically decreased breath sounds. Otherwise clear with auscultation. Normal work of breathing. Abdomen: Soft. Normal bowel sounds. Nondistended.  Musculoskeletal:  Normal bulk and tone. No joint effusion appreciated.  PFT 03/07/07: FVC 3.88 L (101%) FEV1 1.39 L (55%) FEV1/FVC 0.36 FEF 25-75 0.33 L (14%) positive bronchodilator response TLC 7.19 L (145%) this ERV 130% ERV 75% DLCO corrected 62%  6MWT 08/08/17:  Walked 288 meters / Baseline Sat 95% on RA / Nadir Sat 86% on RA @ 2:00 (required 2 L/m to maintain with ambulation) 07/24/06: Walked 1175ft / Baseline Sat 94% on RA / Nadir Sat 91% on RA  IMAGING CXR PA/LAT 04/20/17 (personally reviewed by me):   Hyperinflation suggested by flattening of  the diaphragms. No parenchymal mass or opacity appreciated. No pleural effusion. Heart normal in size & mediastinum normal in contour.  LABS 10/06/16 Alpha-1 antitrypsin: MM (172) IgE: 81 RAST panel: Negative    Assessment & Plan:  83 y.o. male with underlying moderate-severe COPD. Patient's walk test today shows good walk test distance but does demonstrate a significant desaturation. He only required oxygen at 2 L/m today during his walk test with exertion. I believe he would be a good candidate for a portable oxygen concentrator to improve his ability to travel and perform his activities of daily living. Overall his allergies seem to be well-controlled. It's unclear to me why his primary care physician increased his dose of prednisone as he does not seem to be in an exacerbation and the patient reports his symptoms are at his baseline. I will defer further management of prednisone to his PCP. I instructed the patient contact my office if he had any new breathing problems or questions before his next appointment.   1. Moderate-severe COPD: Continuing on Spiriva and Symbicort. Patient to address prescription for Spiriva Respimat with his [facility] at his next  appointment. 2. Chronic hypoxic respiratory failure: Placing an order for DME evaluation for portable oxygen concentrator. Continuing on 2 L/m with exertion. 3. Chronic seasonal allergic rhinitis: Continuing Claritin and Flonase. No changes. 4. Chronic prednisone/steroid use: Continuing patient on prednisone 15 mg daily managed by his PCP. 5. Health maintenance: Status post Prevnar September 2015 & Pneumovax October 2011. Reports he has the Flu Vaccine on 9/18. 6. Follow-up: Return to clinic in 6 months or sooner if needed.  Sonia Baller Ashok Cordia, M.D. [facility] Pager:  [phone] After 3pm or if no response, call [phone] 10:18 AM 08/08/17

## 2017-09-26 DIAGNOSIS — Z955 Presence of coronary angioplasty implant and graft: Secondary | ICD-10-CM | POA: Diagnosis not present

## 2017-09-26 DIAGNOSIS — J441 Chronic obstructive pulmonary disease with (acute) exacerbation: Secondary | ICD-10-CM | POA: Diagnosis not present

## 2017-09-26 DIAGNOSIS — J9611 Chronic respiratory failure with hypoxia: Secondary | ICD-10-CM | POA: Diagnosis not present

## 2017-09-26 DIAGNOSIS — J439 Emphysema, unspecified: Secondary | ICD-10-CM | POA: Diagnosis not present

## 2017-09-26 DIAGNOSIS — I5032 Chronic diastolic (congestive) heart failure: Secondary | ICD-10-CM | POA: Diagnosis not present

## 2017-09-26 DIAGNOSIS — Z6824 Body mass index (BMI) 24.0-24.9, adult: Secondary | ICD-10-CM | POA: Diagnosis not present

## 2017-10-12 DIAGNOSIS — J449 Chronic obstructive pulmonary disease, unspecified: Secondary | ICD-10-CM | POA: Diagnosis not present

## 2017-11-06 DIAGNOSIS — J441 Chronic obstructive pulmonary disease with (acute) exacerbation: Secondary | ICD-10-CM | POA: Diagnosis not present

## 2017-11-06 DIAGNOSIS — M545 Low back pain: Secondary | ICD-10-CM | POA: Diagnosis not present

## 2017-11-06 DIAGNOSIS — I10 Essential (primary) hypertension: Secondary | ICD-10-CM | POA: Diagnosis not present

## 2017-11-16 ENCOUNTER — Emergency Department (HOSPITAL_COMMUNITY): Payer: Medicare HMO

## 2017-11-16 ENCOUNTER — Encounter (HOSPITAL_COMMUNITY): Payer: Self-pay | Admitting: Cardiology

## 2017-11-16 ENCOUNTER — Other Ambulatory Visit: Payer: Self-pay

## 2017-11-16 ENCOUNTER — Inpatient Hospital Stay (HOSPITAL_COMMUNITY)
Admission: EM | Admit: 2017-11-16 | Discharge: 2017-11-22 | DRG: 871 | Disposition: A | Discharge status: Home Health | Payer: Medicare HMO | Attending: Internal Medicine | Admitting: Internal Medicine

## 2017-11-16 DIAGNOSIS — I4892 Unspecified atrial flutter: Secondary | ICD-10-CM | POA: Diagnosis not present

## 2017-11-16 DIAGNOSIS — Z87891 Personal history of nicotine dependence: Secondary | ICD-10-CM | POA: Diagnosis not present

## 2017-11-16 DIAGNOSIS — N401 Enlarged prostate with lower urinary tract symptoms: Secondary | ICD-10-CM | POA: Diagnosis not present

## 2017-11-16 DIAGNOSIS — R7881 Bacteremia: Secondary | ICD-10-CM | POA: Diagnosis present

## 2017-11-16 DIAGNOSIS — M6281 Muscle weakness (generalized): Secondary | ICD-10-CM | POA: Diagnosis not present

## 2017-11-16 DIAGNOSIS — J9611 Chronic respiratory failure with hypoxia: Secondary | ICD-10-CM | POA: Diagnosis present

## 2017-11-16 DIAGNOSIS — R531 Weakness: Secondary | ICD-10-CM

## 2017-11-16 DIAGNOSIS — S22000A Wedge compression fracture of unspecified thoracic vertebra, initial encounter for closed fracture: Secondary | ICD-10-CM | POA: Diagnosis not present

## 2017-11-16 DIAGNOSIS — H268 Other specified cataract: Secondary | ICD-10-CM | POA: Diagnosis present

## 2017-11-16 DIAGNOSIS — I4891 Unspecified atrial fibrillation: Secondary | ICD-10-CM | POA: Diagnosis present

## 2017-11-16 DIAGNOSIS — X58XXXA Exposure to other specified factors, initial encounter: Secondary | ICD-10-CM | POA: Diagnosis present

## 2017-11-16 DIAGNOSIS — J441 Chronic obstructive pulmonary disease with (acute) exacerbation: Secondary | ICD-10-CM | POA: Diagnosis not present

## 2017-11-16 DIAGNOSIS — Z808 Family history of malignant neoplasm of other organs or systems: Secondary | ICD-10-CM

## 2017-11-16 DIAGNOSIS — Z7982 Long term (current) use of aspirin: Secondary | ICD-10-CM

## 2017-11-16 DIAGNOSIS — S32000S Wedge compression fracture of unspecified lumbar vertebra, sequela: Secondary | ICD-10-CM

## 2017-11-16 DIAGNOSIS — I959 Hypotension, unspecified: Secondary | ICD-10-CM | POA: Diagnosis present

## 2017-11-16 DIAGNOSIS — J438 Other emphysema: Secondary | ICD-10-CM | POA: Diagnosis not present

## 2017-11-16 DIAGNOSIS — I10 Essential (primary) hypertension: Secondary | ICD-10-CM | POA: Diagnosis present

## 2017-11-16 DIAGNOSIS — R000 Tachycardia, unspecified: Secondary | ICD-10-CM | POA: Diagnosis present

## 2017-11-16 DIAGNOSIS — T380X5A Adverse effect of glucocorticoids and synthetic analogues, initial encounter: Secondary | ICD-10-CM | POA: Diagnosis present

## 2017-11-16 DIAGNOSIS — Z955 Presence of coronary angioplasty implant and graft: Secondary | ICD-10-CM | POA: Diagnosis not present

## 2017-11-16 DIAGNOSIS — N400 Enlarged prostate without lower urinary tract symptoms: Secondary | ICD-10-CM | POA: Diagnosis not present

## 2017-11-16 DIAGNOSIS — E039 Hypothyroidism, unspecified: Secondary | ICD-10-CM | POA: Diagnosis present

## 2017-11-16 DIAGNOSIS — Z79890 Hormone replacement therapy (postmenopausal): Secondary | ICD-10-CM

## 2017-11-16 DIAGNOSIS — M545 Low back pain: Secondary | ICD-10-CM | POA: Diagnosis not present

## 2017-11-16 DIAGNOSIS — R079 Chest pain, unspecified: Secondary | ICD-10-CM | POA: Diagnosis not present

## 2017-11-16 DIAGNOSIS — J439 Emphysema, unspecified: Secondary | ICD-10-CM | POA: Diagnosis present

## 2017-11-16 DIAGNOSIS — I339 Acute and subacute endocarditis, unspecified: Secondary | ICD-10-CM | POA: Diagnosis not present

## 2017-11-16 DIAGNOSIS — M79602 Pain in left arm: Secondary | ICD-10-CM | POA: Diagnosis present

## 2017-11-16 DIAGNOSIS — Z880 Allergy status to penicillin: Secondary | ICD-10-CM

## 2017-11-16 DIAGNOSIS — A4102 Sepsis due to Methicillin resistant Staphylococcus aureus: Principal | ICD-10-CM | POA: Diagnosis present

## 2017-11-16 DIAGNOSIS — E785 Hyperlipidemia, unspecified: Secondary | ICD-10-CM | POA: Diagnosis present

## 2017-11-16 DIAGNOSIS — Z66 Do not resuscitate: Secondary | ICD-10-CM | POA: Diagnosis present

## 2017-11-16 DIAGNOSIS — Z8249 Family history of ischemic heart disease and other diseases of the circulatory system: Secondary | ICD-10-CM

## 2017-11-16 DIAGNOSIS — I2510 Atherosclerotic heart disease of native coronary artery without angina pectoris: Secondary | ICD-10-CM | POA: Diagnosis present

## 2017-11-16 DIAGNOSIS — M4854XA Collapsed vertebra, not elsewhere classified, thoracic region, initial encounter for fracture: Secondary | ICD-10-CM | POA: Diagnosis present

## 2017-11-16 DIAGNOSIS — Z79899 Other long term (current) drug therapy: Secondary | ICD-10-CM

## 2017-11-16 DIAGNOSIS — S22080A Wedge compression fracture of T11-T12 vertebra, initial encounter for closed fracture: Secondary | ICD-10-CM | POA: Diagnosis not present

## 2017-11-16 DIAGNOSIS — B9562 Methicillin resistant Staphylococcus aureus infection as the cause of diseases classified elsewhere: Secondary | ICD-10-CM | POA: Diagnosis present

## 2017-11-16 DIAGNOSIS — Z9981 Dependence on supplemental oxygen: Secondary | ICD-10-CM | POA: Diagnosis not present

## 2017-11-16 DIAGNOSIS — M546 Pain in thoracic spine: Secondary | ICD-10-CM | POA: Diagnosis not present

## 2017-11-16 DIAGNOSIS — Y92009 Unspecified place in unspecified non-institutional (private) residence as the place of occurrence of the external cause: Secondary | ICD-10-CM | POA: Diagnosis not present

## 2017-11-16 DIAGNOSIS — Z8551 Personal history of malignant neoplasm of bladder: Secondary | ICD-10-CM

## 2017-11-16 DIAGNOSIS — Z7951 Long term (current) use of inhaled steroids: Secondary | ICD-10-CM

## 2017-11-16 DIAGNOSIS — J449 Chronic obstructive pulmonary disease, unspecified: Secondary | ICD-10-CM | POA: Diagnosis not present

## 2017-11-16 DIAGNOSIS — Z7952 Long term (current) use of systemic steroids: Secondary | ICD-10-CM

## 2017-11-16 DIAGNOSIS — R0602 Shortness of breath: Secondary | ICD-10-CM | POA: Diagnosis not present

## 2017-11-16 DIAGNOSIS — I330 Acute and subacute infective endocarditis: Secondary | ICD-10-CM | POA: Diagnosis not present

## 2017-11-16 DIAGNOSIS — R269 Unspecified abnormalities of gait and mobility: Secondary | ICD-10-CM | POA: Diagnosis not present

## 2017-11-16 DIAGNOSIS — S22050A Wedge compression fracture of T5-T6 vertebra, initial encounter for closed fracture: Secondary | ICD-10-CM | POA: Diagnosis not present

## 2017-11-16 DIAGNOSIS — R748 Abnormal levels of other serum enzymes: Secondary | ICD-10-CM | POA: Diagnosis present

## 2017-11-16 DIAGNOSIS — N200 Calculus of kidney: Secondary | ICD-10-CM | POA: Diagnosis not present

## 2017-11-16 LAB — URINALYSIS, ROUTINE W REFLEX MICROSCOPIC
Bilirubin Urine: NEGATIVE
Glucose, UA: NEGATIVE mg/dL
HGB URINE DIPSTICK: NEGATIVE
Ketones, ur: NEGATIVE mg/dL
Leukocytes, UA: NEGATIVE
Nitrite: NEGATIVE
PH: 6 (ref 5.0–8.0)
Protein, ur: NEGATIVE mg/dL
SPECIFIC GRAVITY, URINE: 1.021 (ref 1.005–1.030)

## 2017-11-16 LAB — CBC
HEMATOCRIT: 39.1 % (ref 39.0–52.0)
HEMOGLOBIN: 12.5 g/dL — AB (ref 13.0–17.0)
MCH: 30.8 pg (ref 26.0–34.0)
MCHC: 32 g/dL (ref 30.0–36.0)
MCV: 96.3 fL (ref 78.0–100.0)
Platelets: 237 10*3/uL (ref 150–400)
RBC: 4.06 MIL/uL — AB (ref 4.22–5.81)
RDW: 13.8 % (ref 11.5–15.5)
WBC: 15 10*3/uL — AB (ref 4.0–10.5)

## 2017-11-16 LAB — BASIC METABOLIC PANEL
ANION GAP: 11 (ref 5–15)
BUN: 19 mg/dL (ref 6–20)
CHLORIDE: 98 mmol/L — AB (ref 101–111)
CO2: 26 mmol/L (ref 22–32)
Calcium: 8.9 mg/dL (ref 8.9–10.3)
Creatinine, Ser: 0.82 mg/dL (ref 0.61–1.24)
Glucose, Bld: 106 mg/dL — ABNORMAL HIGH (ref 65–99)
POTASSIUM: 4.2 mmol/L (ref 3.5–5.1)
SODIUM: 135 mmol/L (ref 135–145)

## 2017-11-16 LAB — I-STAT CG4 LACTIC ACID, ED: Lactic Acid, Venous: 1.85 mmol/L (ref 0.5–1.9)

## 2017-11-16 LAB — HEPATIC FUNCTION PANEL
ALBUMIN: 3.7 g/dL (ref 3.5–5.0)
ALK PHOS: 110 U/L (ref 38–126)
ALT: 40 U/L (ref 17–63)
AST: 32 U/L (ref 15–41)
BILIRUBIN TOTAL: 1 mg/dL (ref 0.3–1.2)
Bilirubin, Direct: 0.2 mg/dL (ref 0.1–0.5)
Indirect Bilirubin: 0.8 mg/dL (ref 0.3–0.9)
TOTAL PROTEIN: 7.1 g/dL (ref 6.5–8.1)

## 2017-11-16 LAB — TSH: TSH: 1.778 u[IU]/mL (ref 0.350–4.500)

## 2017-11-16 LAB — BRAIN NATRIURETIC PEPTIDE: B NATRIURETIC PEPTIDE 5: 54 pg/mL (ref 0.0–100.0)

## 2017-11-16 LAB — T4, FREE: Free T4: 1.42 ng/dL — ABNORMAL HIGH (ref 0.61–1.12)

## 2017-11-16 LAB — TROPONIN I
TROPONIN I: 0.04 ng/mL — AB (ref <0.03)
Troponin I: <0.03 ng/mL (ref <0.03)

## 2017-11-16 LAB — INFLUENZA PANEL BY PCR (TYPE A & B)
INFLAPCR: NEGATIVE
Influenza B By PCR: NEGATIVE

## 2017-11-16 MED ORDER — METHYLPREDNISOLONE SODIUM SUCC 40 MG IJ SOLR
40.0000 mg | Freq: Two times a day (BID) | INTRAMUSCULAR | Status: DC
  Administered 2017-11-17 – 2017-11-21 (×9): 40 mg via INTRAVENOUS
  Filled 2017-11-16 (×10): qty 1

## 2017-11-16 MED ORDER — SODIUM CHLORIDE 0.9 % IV BOLUS (SEPSIS)
500.0000 mL | Freq: Once | INTRAVENOUS | Status: AC
  Administered 2017-11-16: 500 mL via INTRAVENOUS

## 2017-11-16 MED ORDER — IOPAMIDOL (ISOVUE-370) INJECTION 76%
100.0000 mL | Freq: Once | INTRAVENOUS | Status: AC | PRN
  Administered 2017-11-16: 100 mL via INTRAVENOUS

## 2017-11-16 MED ORDER — ONDANSETRON HCL 4 MG/2ML IJ SOLN
4.0000 mg | Freq: Once | INTRAMUSCULAR | Status: AC
  Administered 2017-11-16: 4 mg via INTRAVENOUS
  Filled 2017-11-16: qty 2

## 2017-11-16 MED ORDER — DILTIAZEM HCL-DEXTROSE 100-5 MG/100ML-% IV SOLN (PREMIX)
5.0000 mg/h | Freq: Once | INTRAVENOUS | Status: AC
  Administered 2017-11-16: 5 mg/h via INTRAVENOUS
  Filled 2017-11-16: qty 100

## 2017-11-16 MED ORDER — TIOTROPIUM BROMIDE MONOHYDRATE 18 MCG IN CAPS
1.0000 | ORAL_CAPSULE | Freq: Every day | RESPIRATORY_TRACT | Status: DC
  Filled 2017-11-16: qty 5

## 2017-11-16 MED ORDER — PRAVASTATIN SODIUM 10 MG PO TABS
10.0000 mg | ORAL_TABLET | Freq: Every day | ORAL | Status: DC
  Administered 2017-11-17 – 2017-11-22 (×6): 10 mg via ORAL
  Filled 2017-11-16 (×6): qty 1

## 2017-11-16 MED ORDER — IPRATROPIUM-ALBUTEROL 0.5-2.5 (3) MG/3ML IN SOLN
3.0000 mL | Freq: Four times a day (QID) | RESPIRATORY_TRACT | Status: DC
  Administered 2017-11-16: 3 mL via RESPIRATORY_TRACT
  Filled 2017-11-16: qty 3

## 2017-11-16 MED ORDER — ACETAMINOPHEN 325 MG PO TABS
650.0000 mg | ORAL_TABLET | Freq: Four times a day (QID) | ORAL | Status: DC | PRN
  Administered 2017-11-16: 650 mg via ORAL
  Filled 2017-11-16: qty 2

## 2017-11-16 MED ORDER — LEVOTHYROXINE SODIUM 88 MCG PO TABS
88.0000 ug | ORAL_TABLET | Freq: Every day | ORAL | Status: DC
  Administered 2017-11-17 – 2017-11-22 (×6): 88 ug via ORAL
  Filled 2017-11-16 (×6): qty 1

## 2017-11-16 MED ORDER — VANCOMYCIN HCL IN DEXTROSE 1-5 GM/200ML-% IV SOLN
1000.0000 mg | Freq: Once | INTRAVENOUS | Status: AC
  Administered 2017-11-16: 1000 mg via INTRAVENOUS
  Filled 2017-11-16: qty 200

## 2017-11-16 MED ORDER — FENTANYL CITRATE (PF) 100 MCG/2ML IJ SOLN
25.0000 ug | Freq: Once | INTRAMUSCULAR | Status: AC
  Administered 2017-11-16: 25 ug via INTRAVENOUS
  Filled 2017-11-16: qty 2

## 2017-11-16 MED ORDER — LEVOFLOXACIN IN D5W 750 MG/150ML IV SOLN
750.0000 mg | Freq: Once | INTRAVENOUS | Status: AC
  Administered 2017-11-16: 750 mg via INTRAVENOUS
  Filled 2017-11-16: qty 150

## 2017-11-16 MED ORDER — ASPIRIN EC 81 MG PO TBEC
81.0000 mg | DELAYED_RELEASE_TABLET | Freq: Every day | ORAL | Status: DC
  Administered 2017-11-17 – 2017-11-22 (×6): 81 mg via ORAL
  Filled 2017-11-16 (×6): qty 1

## 2017-11-16 MED ORDER — SODIUM CHLORIDE 0.9% FLUSH
3.0000 mL | Freq: Two times a day (BID) | INTRAVENOUS | Status: DC
  Administered 2017-11-16 – 2017-11-22 (×10): 3 mL via INTRAVENOUS

## 2017-11-16 MED ORDER — MOMETASONE FURO-FORMOTEROL FUM 200-5 MCG/ACT IN AERO
2.0000 | INHALATION_SPRAY | Freq: Two times a day (BID) | RESPIRATORY_TRACT | Status: DC
  Administered 2017-11-17 – 2017-11-22 (×11): 2 via RESPIRATORY_TRACT
  Filled 2017-11-16: qty 8.8

## 2017-11-16 MED ORDER — POLYETHYLENE GLYCOL 3350 17 G PO PACK
17.0000 g | PACK | Freq: Every day | ORAL | Status: DC | PRN
  Administered 2017-11-19 – 2017-11-21 (×2): 17 g via ORAL
  Filled 2017-11-16 (×2): qty 1

## 2017-11-16 MED ORDER — CARVEDILOL 3.125 MG PO TABS
3.1250 mg | ORAL_TABLET | Freq: Two times a day (BID) | ORAL | Status: DC
  Administered 2017-11-17 – 2017-11-21 (×9): 3.125 mg via ORAL
  Filled 2017-11-16 (×9): qty 1

## 2017-11-16 MED ORDER — ACETAMINOPHEN 650 MG RE SUPP
650.0000 mg | Freq: Once | RECTAL | Status: AC
  Administered 2017-11-16: 650 mg via RECTAL
  Filled 2017-11-16: qty 1

## 2017-11-16 MED ORDER — LEVALBUTEROL HCL 0.63 MG/3ML IN NEBU
0.6300 mg | INHALATION_SOLUTION | Freq: Once | RESPIRATORY_TRACT | Status: AC
  Administered 2017-11-16: 0.63 mg via RESPIRATORY_TRACT
  Filled 2017-11-16: qty 3

## 2017-11-16 MED ORDER — NITROGLYCERIN 0.4 MG SL SUBL: 0.4000 mg | SUBLINGUAL_TABLET | SUBLINGUAL | Status: DC | PRN

## 2017-11-16 MED ORDER — DILTIAZEM HCL 25 MG/5ML IV SOLN
10.0000 mg | Freq: Once | INTRAVENOUS | Status: AC
  Administered 2017-11-16: 10 mg via INTRAVENOUS
  Filled 2017-11-16: qty 5

## 2017-11-16 MED ORDER — METHYLPREDNISOLONE SODIUM SUCC 125 MG IJ SOLR
125.0000 mg | Freq: Once | INTRAMUSCULAR | Status: AC
  Administered 2017-11-16: 125 mg via INTRAVENOUS
  Filled 2017-11-16: qty 2

## 2017-11-16 MED ORDER — ACETAMINOPHEN 650 MG RE SUPP: 650.0000 mg | Freq: Four times a day (QID) | RECTAL | Status: DC | PRN

## 2017-11-16 MED ORDER — HEPARIN SODIUM (PORCINE) 5000 UNIT/ML IJ SOLN
5000.0000 [IU] | Freq: Three times a day (TID) | INTRAMUSCULAR | Status: DC
  Administered 2017-11-17 – 2017-11-22 (×17): 5000 [IU] via SUBCUTANEOUS
  Filled 2017-11-16 (×18): qty 1

## 2017-11-16 NOTE — ED Notes (Signed)
CRITICAL VALUE ALERT  Critical Value:  Troponin 0.04  Date & Time Notied:  11/16/2017 at [place]  Provider Notified: Dr. Denton Brick  Orders Received/Actions taken:

## 2017-11-16 NOTE — ED Notes (Signed)
sats on 2 liters 89%.  Increased oxygen to 3 liters.

## 2017-11-16 NOTE — ED Notes (Signed)
C/o nausea and vomiting.  Dr. Lita Mains notifed and orders received.

## 2017-11-16 NOTE — ED Provider Notes (Signed)
[facility] EMERGENCY DEPARTMENT Provider Note   CSN: [id number] Arrival date & time: 11/16/17  1025     History   Chief Complaint Chief Complaint  Patient presents with  . Back Pain    HPI Willie Williamson is a 83 y.o. male.  HPI Patient with history of COPD on 2 L of home O2 presents with 1 month history of right-sided low back pain.  This is worsened since last week.  Patient has had nonproductive cough.  Back pain is worsened with deep breathing and coughing.  Patient admits to subjective fevers and chills.  No chest pain.  Increased bilateral lower extremity swelling. Past Medical History:  Diagnosis Date  . Anemia   . CAD (coronary artery disease)    a. 03/2014 - s/p DES to RCA, nonobstructive disease in LAD/Cx, EF 60%.  . Cataract, congenital    "left eye"  . COPD (chronic obstructive pulmonary disease) with emphysema ([place])    a. Chronic resp failure on home O2 (2L).  . H/O hiatal hernia   . HTN (hypertension)   . Hyperlipidemia   . Hypothyroidism   . Malignant neoplasm of bladder, part unspecified    "it was cured"     Patient Active Problem List   Diagnosis Date Noted  . Dyspnea 08/08/2017  . Chronic respiratory failure with hypoxia ([place]) 11/09/2015  . Preoperative clearance 08/11/2015  . Unstable angina ([place]) 04/17/2014  . Nonspecific abnormal unspecified cardiovascular function study 04/16/2014  . TINEA CORPORIS 04/20/2010  . Essential hypertension 09/06/2009  . BLADDER CANCER 01/10/2008  . HYPERLIPIDEMIA 01/10/2008  . Coronary atherosclerosis 01/10/2008  . EMPHYSEMA 10/10/2007  . COPD with emphysema ([place]) 10/10/2007    Past Surgical History:  Procedure Laterality Date  . BLADDER SURGERY  ` 2006   "bladder polyps"  . CARDIAC CATHETERIZATION  2008  . CORONARY ANGIOPLASTY WITH STENT PLACEMENT  04/16/2014   "1"  . LEFT HEART CATHETERIZATION WITH CORONARY ANGIOGRAM N/A 04/16/2014   Procedure: LEFT HEART CATHETERIZATION WITH CORONARY ANGIOGRAM;   Surgeon: [name], MD;  Location: [facility] CATH LAB;  Service: Cardiovascular;  Laterality: N/A;  . PERCUTANEOUS CORONARY STENT INTERVENTION (PCI-S)  04/16/2014   Procedure: PERCUTANEOUS CORONARY STENT INTERVENTION (PCI-S);  Surgeon: [name], MD;  Location: [facility] CATH LAB;  Service: Cardiovascular;;  . TONSILLECTOMY    . TURP VAPORIZATION         Home Medications    Prior to Admission medications   Medication Sig Start Date End Date Taking? Authorizing Provider  carvedilol (COREG) 3.125 MG tablet Take 1 tablet (3.125 mg total) by mouth 2 (two) times daily with a meal. 12/12/16  Yes [name], MD  fluticasone (FLONASE) 50 MCG/ACT nasal spray PLACE 1 SPRAY INTO BOTH NOSTRILS 2 (TWO) TIMES DAILY AS NEEDED. 07/10/16  Yes Javier Glazier, MD  levothyroxine (SYNTHROID, LEVOTHROID) 88 MCG tablet Take 88 mcg by mouth daily before breakfast.   Yes [provider]  lovastatin (MEVACOR) 20 MG tablet Take 20 mg by mouth at bedtime.     Yes [provider]  nitroGLYCERIN (NITROSTAT) 0.4 MG SL tablet Place 1 tablet (0.4 mg total) under the tongue every 5 (five) minutes as needed for chest pain (up to 3 doses). 12/12/16  Yes [name], MD  predniSONE (DELTASONE) 10 MG tablet Take 15 mg by mouth daily.  10/29/15  Yes [provider]  albuterol (PROAIR HFA) 108 (90 Base) MCG/ACT inhaler INHALE 2 PUFFS INTO THE LUNGS EVERY 4 (  FOUR) HOURS AS NEEDED FOR WHEEZING OR SHORTNESS OF BREATH. 01/30/17   Javier Glazier, MD  aspirin 81 MG tablet Take 81 mg by mouth daily.      [provider]  budesonide-formoterol (SYMBICORT) 160-4.5 MCG/ACT inhaler Inhale 2 puffs into the lungs 2 (two) times daily. 01/30/17   Javier Glazier, MD  GuaiFENesin (MUCINEX PO) Take 400 mg by mouth 3 (three) times daily as needed (for congestion).     [provider]  tiotropium (SPIRIVA HANDIHALER) 18 MCG inhalation capsule PLACE 1 CAPSULE (18 MCG TOTAL) INTO  INHALER AND INHALE DAILY. 01/30/17   Javier Glazier, MD  Tiotropium Bromide Monohydrate (SPIRIVA RESPIMAT) 2.5 MCG/ACT AERS Inhale 2 puffs into the lungs daily. 04/26/17   Javier Glazier, MD    Family History Family History  Problem Relation Age of Onset  . Bone cancer Mother   . Hypertension Father   . Lung disease Neg Hx     Social History Social History   Tobacco Use  . Smoking status: Former Smoker    Packs/day: 1.50    Years: 48.00    Pack years: 72.00    Types: Cigarettes    Start date: 01/03/1949    Last attempt to quit: 11/20/1996    Years since quitting: 21.0  . Smokeless tobacco: Never Used  Substance Use Topics  . Alcohol use: No    Alcohol/week: 0.0 oz  . Drug use: No     Allergies   Penicillins   Review of Systems Review of Systems  Constitutional: Positive for chills, fatigue and fever.  HENT: Negative for sinus pressure, sore throat and trouble swallowing.   Respiratory: Positive for cough, shortness of breath and wheezing.   Cardiovascular: Positive for palpitations and leg swelling. Negative for chest pain.  Gastrointestinal: Negative for abdominal pain, constipation, diarrhea, nausea and vomiting.  Genitourinary: Negative for dysuria, flank pain and frequency.  Musculoskeletal: Positive for back pain and myalgias. Negative for neck pain and neck stiffness.  Skin: Negative for rash and wound.  Neurological: Negative for dizziness, weakness, light-headedness, numbness and headaches.  All other systems reviewed and are negative.    Physical Exam Updated Vital Signs BP (!) 100/52   Pulse (!) 132   Temp 100.1 F (37.8 C) (Oral)   Resp 19   Ht 5\' 7"  (1.702 m)   Wt 66.2 kg (146 lb)   SpO2 91%   BMI 22.87 kg/m   Physical Exam  Constitutional: He is oriented to person, place, and time. He appears well-developed and well-nourished.  HENT:  Head: Normocephalic and atraumatic.  Mouth/Throat: Oropharynx is clear and moist.  Eyes: EOM are  normal. Pupils are equal, round, and reactive to light.  Neck: Normal range of motion. Neck supple.  Cardiovascular: Regular rhythm.  Tachycardia  Pulmonary/Chest:  Increased respiratory effort.  Decreased air movement throughout.  Abdominal: Soft. Bowel sounds are normal. There is no tenderness. There is no rebound and no guarding.  Musculoskeletal: Normal range of motion. He exhibits tenderness. He exhibits no edema.  Patient does have some right-sided inferior thoracic back pain to palpation.  No midline thoracic or lumbar tenderness.  No definite CVA tenderness.  Right 3+ bilateral lower extremity pitting edema.  Distal pulses intact.  Neurological: He is alert and oriented to person, place, and time.  Is all extremities without focal deficit.  Sensation fully intact.  Skin: Skin is warm and dry. No rash noted. No erythema.  Psychiatric: He has a normal mood  and affect. His behavior is normal.  Nursing note and vitals reviewed.    ED Treatments / Results  Labs (all labs ordered are listed, but only abnormal results are displayed) Labs Reviewed  BASIC METABOLIC PANEL - Abnormal; Notable for the following components:      Result Value   Chloride 98 (*)    Glucose, Bld 106 (*)    All other components within normal limits  CBC - Abnormal; Notable for the following components:   WBC 15.0 (*)    RBC 4.06 (*)    Hemoglobin 12.5 (*)    All other components within normal limits  CULTURE, BLOOD (ROUTINE X 2)  CULTURE, BLOOD (ROUTINE X 2)  TROPONIN I  HEPATIC FUNCTION PANEL  URINALYSIS, ROUTINE W REFLEX MICROSCOPIC  TSH  BRAIN NATRIURETIC PEPTIDE  T4, FREE  I-STAT CG4 LACTIC ACID, ED    EKG  EKG Interpretation  Date/Time:  Friday November 16 2017 10:32:50 EST Ventricular Rate:  148 PR Interval:    QRS Duration: 95 QT Interval:  306 QTC Calculation: 481 R Axis:   -94 Text Interpretation:  Supraventricular tachycardia Inferolateral infarct, age indeterminate Baseline wander  in lead(s) V3 Confirmed by Julianne Rice [phone]) on 11/16/2017 10:46:27 AM       Radiology Dg Chest 2 View  Result Date: 11/16/2017 CLINICAL DATA:  Tachycardia, chest pain, shortness of breath EXAM: CHEST  2 VIEW COMPARISON:  04/20/2017 FINDINGS: Lungs are essentially clear. Bibasilar scarring. No focal consolidation. No pleural effusion or pneumothorax. The heart is normal in size. Moderate compression fracture deformity of a mid and lower vertebral body, possibly T5 and T11. Mild anterior wedging of an additional mid thoracic vertebral body, possibly T4. These findings are age indeterminate but new from the prior. IMPRESSION: No evidence of acute cardiopulmonary disease. Mild to moderate compression fracture deformities of three thoracic vertebral bodies, as described above. These findings are age indeterminate but new from 04/20/2017. Electronically Signed   By: Julian Hy M.D.   On: 11/16/2017 11:40   Ct Angio Chest/abd/pel For Dissection W And/or Wo Contrast  Result Date: 11/16/2017 CLINICAL DATA:  Chest and low back pain, initial encounter EXAM: CT ANGIOGRAPHY CHEST, ABDOMEN AND PELVIS TECHNIQUE: Multidetector CT imaging through the chest, abdomen and pelvis was performed using the standard protocol during bolus administration of intravenous contrast. Multiplanar reconstructed images and MIPs were obtained and reviewed to evaluate the vascular anatomy. CONTRAST:  1109mL ISOVUE-370 IOPAMIDOL (ISOVUE-370) INJECTION 76% COMPARISON:  None. FINDINGS: CTA CHEST FINDINGS Cardiovascular: The thoracic aorta and its branches demonstrate mild atherosclerotic changes. No aneurysmal dilatation or dissection is identified. Coronary calcifications are seen. No cardiac enlargement is seen. The pulmonary artery as visualized shows no large central embolus. Mediastinum/Nodes: Thoracic inlet is within normal limits. No hilar or mediastinal adenopathy is identified. The esophagus is within normal limits.  Lungs/Pleura: Diffuse emphysematous changes are identified. Mild atelectatic changes are noted in the right lower lobe and lingula. No focal infiltrate or sizable parenchymal nodule is noted. No pleural effusion is seen. Musculoskeletal: There compression deformities of T5-T6 and T12 identified. These are new from June of 2018 but unchanged from the recent chest x-ray. They have a chronic appearance given significant sclerosis. There is also a fracture of the T5 spinous process identified. Review of the MIP images confirms the above findings. CTA ABDOMEN AND PELVIS FINDINGS VASCULAR Aorta: Diffuse atherosclerotic changes of the aorta are noted without aneurysmal dilatation. Celiac: Mild narrowing is noted proximally with poststenotic dilatation. Atherosclerotic changes are  identified. SMA: Mild atherosclerotic changes are noted. No focal stenosis is seen. Renals: Proximal atherosclerotic changes are noted without focal stenosis. IMA: Patent without evidence of aneurysm, dissection, vasculitis or significant stenosis. Iliacs: Diffuse atherosclerotic changes are noted without aneurysmal dilatation or focal stenosis. Veins: No obvious venous abnormality within the limitations of this arterial phase study. Review of the MIP images confirms the above findings. NON-VASCULAR Hepatobiliary: Diffuse decreased attenuation is noted consistent with fatty infiltration. Gallbladder is well distended without cholelithiasis. Pancreas: Unremarkable. No pancreatic ductal dilatation or surrounding inflammatory changes. Spleen: Normal in size without focal abnormality. Adrenals/Urinary Tract: Adrenal glands are within normal limits. Nonobstructing 2 mm stone is noted in the lower pole of the right kidney. Bladder is well distended. Stomach/Bowel: The appendix is within normal limits without inflammatory change. No obstructive or inflammatory changes in the large and small bowel are noted. Lymphatic: No significant lymphadenopathy is  noted. Reproductive: Prostatic calcifications are seen. Other: No abdominal wall hernia or abnormality. No abdominopelvic ascites. Musculoskeletal: Degenerative changes of lumbar spine are noted. No acute compression deformity is seen. Review of the MIP images confirms the above findings. IMPRESSION: No evidence of acute aortic abnormality. Diffuse atherosclerotic changes are noted. Diffuse emphysematous changes. Multiple compression deformities which appear chronic in nature as described. A chronic appearing T5 spinous process fracture is noted as well. Nonobstructing right renal stone. Chronic changes as described above. Electronically Signed   By: Inez Catalina M.D.   On: 11/16/2017 15:19    Procedures Procedures (including critical care time)  Medications Ordered in ED Medications  vancomycin (VANCOCIN) IVPB 1000 mg/200 mL premix (1,000 mg Intravenous New Bag/Given 11/16/17 1528)  methylPREDNISolone sodium succinate (SOLU-MEDROL) 125 mg/2 mL injection 125 mg (not administered)  diltiazem (CARDIZEM) injection 10 mg (10 mg Intravenous Given 11/16/17 1111)  diltiazem (CARDIZEM) 100 mg in dextrose 5% 172mL (1 mg/mL) infusion (15 mg/hr Intravenous Rate/Dose Change 11/16/17 1506)  fentaNYL (SUBLIMAZE) injection 25 mcg (25 mcg Intravenous Given 11/16/17 1347)  ondansetron (ZOFRAN) injection 4 mg (4 mg Intravenous Given 11/16/17 1347)  acetaminophen (TYLENOL) suppository 650 mg (650 mg Rectal Given 11/16/17 1504)  levalbuterol (XOPENEX) nebulizer solution 0.63 mg (0.63 mg Nebulization Given 11/16/17 1434)  levofloxacin (LEVAQUIN) IVPB 750 mg (750 mg Intravenous New Bag/Given 11/16/17 1437)  sodium chloride 0.9 % bolus 500 mL (0 mLs Intravenous Stopped 11/16/17 1555)  iopamidol (ISOVUE-370) 76 % injection 100 mL (100 mLs Intravenous Contrast Given 11/16/17 1407)  sodium chloride 0.9 % bolus 500 mL (500 mLs Intravenous New Bag/Given 11/16/17 1557)   CRITICAL CARE Performed by: Julianne Rice Total  critical care time: 50 minutes Critical care time was exclusive of separately billable procedures and treating other patients. Critical care was necessary to treat or prevent imminent or life-threatening deterioration. Critical care was time spent personally by me on the following activities: development of treatment plan with patient and/or surrogate as well as nursing, discussions with consultants, evaluation of patient's response to treatment, examination of patient, obtaining history from patient or surrogate, ordering and performing treatments and interventions, ordering and review of laboratory studies, ordering and review of radiographic studies, pulse oximetry and re-evaluation of patient's condition.  Initial Impression / Assessment and Plan / ED Course  I have reviewed the triage vital signs and the nursing notes.  Pertinent labs & imaging results that were available during my care of the patient were reviewed by me and considered in my medical decision making (see chart for details).     Patient with initial tachycardia  and low-grade fever.  Also was short of breath requiring increasing amounts of oxygen.  Concern for possible pneumonia/respiratory illness.  X-ray without acute findings.  Given dose of Cardizem with some improvement of his tachycardia.  Question sinus tachycardia versus 2-1 a flutter.  Normal lactic acid.  Patient does have a mild elevation in his white blood cell count but is on chronic prednisone.  CT angio chest and abdomen pelvis demonstrate multiple compression fractures which is likely causing the patient's back pain.  Bladder is also distended.  Foley catheter placed.  Patient was also started on broad-spectrum antibiotics due to concern for possible sepsis.  He is been given multiple IV fluid boluses.  Is awake and alert and in no distress.  Has persistent tachycardia.  Will discuss with hospitalist regarding admission.  Final Clinical Impressions(s) / ED Diagnoses     Final diagnoses:  COPD with acute exacerbation ([place])  Thoracic compression fracture, closed, initial encounter [facility])  Tachycardia    ED Discharge Orders    None       Julianne Rice, MD 11/16/17 [phone]

## 2017-11-16 NOTE — ED Notes (Signed)
Pt denies any left arm pain at this time.

## 2017-11-16 NOTE — ED Notes (Signed)
After arrival to the ER pt states his left arm has started to hurt.

## 2017-11-16 NOTE — ED Notes (Signed)
Pt c/o left arm pain.  hospitalist at bedside.

## 2017-11-16 NOTE — ED Notes (Signed)
C/o increasing SOB.  Lung sounds diminished with wheezing.  Dr. Lita Mains notified and orders received.

## 2017-11-16 NOTE — ED Notes (Signed)
Pt resting quietly. No distress.

## 2017-11-16 NOTE — ED Triage Notes (Signed)
Lower back pain  Times one  Month.  Seen PCP last week and was prescribed prednisone.  Pt sob ,  But states he is at his baseline for breathing.  Pt states he has COPD and is 2 liters oxygen at home.

## 2017-11-16 NOTE — H&P (Addendum)
History and Physical    Willie Williamson VQQ:[id number] DOB: 05-06-1931 DOA: 11/16/2017  PCP: [name], MD   Patient coming from: Home  Chief Complaint: Back Pain  HPI: Willie Williamson is a 83 y.o. male with medical history significant for emphysema chronic respiratory failure on chronic home O2 2-3 L, CAD, HTN, bladder cancer, BPH,.  Patient presented to the ED with complaints of back pain present over the past year both significantly worsened about 2 days ago. Pt denies falls, no lower extremity weakness.  Patient also endorses worsening difficulty breathing over the past 2 days.  Chronic nonproductive cough unchanged.  No fever no chills at home.  Patient also complains of left arm pain, that started here in the ED. No chest pains. Patient son thinks patient was placed on prednisone about a week ago for COPD, and does not think patient is on chronic steroids.  ED Course: Blood pressure initially 133/83, dropped to 79/54, while on Cardizem drip.  Patient also tachycardic on admission pulse of 157.  Temperature max recorded in the ED 100.1.  Patient's O2 increased to 3 L [place], O2 sats greater than 87%.  Chest x-ray negative for acute abnormality, chest CT he negative for acute abnormality but showed multiple compression deformities which appear chronic.  EKG showed what appeared to be atrial flutter. Patient was given Cardizem 10 mg and subsequently placed on Cardizem drip.  Blood pressure dropped.  Patient was given 1 L normal saline bolus.  Patient's tachycardia improved.  Patient was also given 125 mg of Solu-Medrol, placed on antibiotics- vancomycin and Levaquin for fever , with ?focus of infection..  Review of Systems: As per HPI otherwise 10 point review of systems negative.   Past Medical History:  Diagnosis Date  . Anemia   . CAD (coronary artery disease)    a. 03/2014 - s/p DES to RCA, nonobstructive disease in LAD/Cx, EF 60%.  . Cataract, congenital    "left eye"  . COPD  (chronic obstructive pulmonary disease) with emphysema ([place])    a. Chronic resp failure on home O2 (2L).  . H/O hiatal hernia   . HTN (hypertension)   . Hyperlipidemia   . Hypothyroidism   . Malignant neoplasm of bladder, part unspecified    "it was cured"     Past Surgical History:  Procedure Laterality Date  . BLADDER SURGERY  ` 2006   "bladder polyps"  . CARDIAC CATHETERIZATION  2008  . CORONARY ANGIOPLASTY WITH STENT PLACEMENT  04/16/2014   "1"  . LEFT HEART CATHETERIZATION WITH CORONARY ANGIOGRAM N/A 04/16/2014   Procedure: LEFT HEART CATHETERIZATION WITH CORONARY ANGIOGRAM;  Surgeon: [name], MD;  Location[facility] C[facility] LAB;  Service: Cardiovascular;  Laterality: N/A;  . PERCUTANEOUS CORONARY STENT INTERVENTION (PCI-S)  04/16/2014   Procedure: PERCUTANEOUS CORONARY STENT INTERVENTION (PCI-S);  Surgeon: [name], MD;  Location: [facility] CATH LAB;  Service: Cardiovascular;;  . TONSILLECTOMY    . TURP VAPORIZATION      reports that he quit smoking about 21 years ago. His smoking use included cigarettes. He started smoking about 68 years ago. He has a 72.00 pack-year smoking history. he has never used smokeless tobacco. He reports that he does not drink alcohol or use drugs.  Allergies  Allergen Reactions  . Penicillins Rash    Has patient had a PCN reaction causing immediate rash, facial/tongue/throat swelling, SOB or lightheadedness with hypotension: Yes Has patient had a PCN reaction causing severe rash involving mucus membranes  or skin necrosis: No Has patient had a PCN reaction that required hospitalization: No Has patient had a PCN reaction occurring within the last 10 years: No If all of the above answers are "NO", then may proceed with Cephalosporin use.     Family History  Problem Relation Age of Onset  . Bone cancer Mother   . Hypertension Father   . Lung disease Neg Hx     Prior to Admission medications   Medication Sig Start Date End Date Taking?  Authorizing Provider  albuterol (PROAIR HFA) 108 (90 Base) MCG/ACT inhaler INHALE 2 PUFFS INTO THE LUNGS EVERY 4 (FOUR) HOURS AS NEEDED FOR WHEEZING OR SHORTNESS OF BREATH. 01/30/17  Yes Willie Glazier, MD  aspirin 81 MG tablet Take 81 mg by mouth daily.     Yes [provider]  budesonide-formoterol (SYMBICORT) 160-4.5 MCG/ACT inhaler Inhale 2 puffs into the lungs 2 (two) times daily. 01/30/17  Yes Willie Glazier, MD  carvedilol (COREG) 3.125 MG tablet Take 1 tablet (3.125 mg total) by mouth 2 (two) times daily with a meal. 12/12/16  Yes [name], MD  fluticasone (FLONASE) 50 MCG/ACT nasal spray PLACE 1 SPRAY INTO BOTH NOSTRILS 2 (TWO) TIMES DAILY AS NEEDED. 07/10/16  Yes Willie Glazier, MD  GuaiFENesin (MUCINEX PO) Take 400 mg by mouth 3 (three) times daily as needed (for congestion).    Yes [provider]  levothyroxine (SYNTHROID, LEVOTHROID) 88 MCG tablet Take 88 mcg by mouth daily before breakfast.   Yes [provider]  lovastatin (MEVACOR) 20 MG tablet Take 20 mg by mouth at bedtime.     Yes [provider]  nitroGLYCERIN (NITROSTAT) 0.4 MG SL tablet Place 1 tablet (0.4 mg total) under the tongue every 5 (five) minutes as needed for chest pain (up to 3 doses). 12/12/16  Yes [name], MD  predniSONE (DELTASONE) 10 MG tablet Take 15 mg by mouth daily.  10/29/15  Yes [provider]  tiotropium (SPIRIVA HANDIHALER) 18 MCG inhalation capsule PLACE 1 CAPSULE (18 MCG TOTAL) INTO INHALER AND INHALE DAILY. 01/30/17  Yes Willie Glazier, MD  Tiotropium Bromide Monohydrate (SPIRIVA RESPIMAT) 2.5 MCG/ACT AERS Inhale 2 puffs into the lungs daily. Patient not taking: Reported on 11/16/2017 04/26/17   Willie Glazier, MD    Physical Exam: Vitals:   11/16/17 1504 11/16/17 1530 11/16/17 1630 11/16/17 1651  BP: (!) 100/56 (!) 100/52 (!) 82/45 (!) 93/54  Pulse: (!) 138 (!) 132 (!) 121   Resp: (!) 21 19 (!) 28   Temp:        TempSrc:      SpO2: 92% 91% (!) 87%   Weight:      Height:        Constitutional: NAD, mod respiratory distress, on [place] Vitals:   11/16/17 1504 11/16/17 1530 11/16/17 1630 11/16/17 1651  BP: (!) 100/56 (!) 100/52 (!) 82/45 (!) 93/54  Pulse: (!) 138 (!) 132 (!) 121   Resp: (!) 21 19 (!) 28   Temp:      TempSrc:      SpO2: 92% 91% (!) 87%   Weight:      Height:       Eyes: PERRL, lids and conjunctivae normal ENMT: Mucous membranes dry. Posterior pharynx clear of any exudate or lesions.Normal dentition.  Neck: normal no masses, no thyromegaly Respiratory: Marked globally reduced breath sounds,  Cardiovascular: Tachycardia, Regular rate and rhythm, no murmurs / rubs / gallops. No extremity  edema. 2+ pedal pulses. No carotid bruits.  Abdomen: no tenderness, no masses palpated. No hepatosplenomegaly. Bowel sounds positive.  Musculoskeletal: no clubbing / cyanosis. No joint deformity upper and lower extremities. Good ROM, no contractures. Normal muscle tone.  Skin: no rashes, lesions, ulcers. No induration Neurologic: CN 2-12 grossly intact. Sensation intact, Strength 5/5 in all 4.  Psychiatric: Normal judgment and insight. Alert and oriented x 3. Normal mood.   Labs on Admission: I have personally reviewed following labs and imaging studies  CBC: Recent Labs  Lab 11/16/17 1039  WBC 15.0*  HGB 12.5*  HCT 39.1  MCV 96.3  PLT 166   Basic Metabolic Panel: Recent Labs  Lab 11/16/17 1039  NA 135  K 4.2  CL 98*  CO2 26  GLUCOSE 106*  BUN 19  CREATININE 0.82  CALCIUM 8.9   GFR: Estimated Creatinine Clearance: 62.7 mL/min (by C-G formula based on SCr of 0.82 mg/dL). Liver Function Tests: Recent Labs  Lab 11/16/17 1058  AST 32  ALT 40  ALKPHOS 110  BILITOT 1.0  PROT 7.1  ALBUMIN 3.7   Cardiac Enzymes: Recent Labs  Lab 11/16/17 1039  TROPONINI <0.03   Thyroid Function Tests: Recent Labs    11/16/17 1058  TSH 1.778   Urine analysis:    Component Value  Date/Time   COLORURINE YELLOW 11/16/2017 1500   APPEARANCEUR CLEAR 11/16/2017 1500   LABSPEC 1.021 11/16/2017 1500   PHURINE 6.0 11/16/2017 1500   GLUCOSEU NEGATIVE 11/16/2017 1500   HGBUR NEGATIVE 11/16/2017 1500   BILIRUBINUR NEGATIVE 11/16/2017 1500   KETONESUR NEGATIVE 11/16/2017 1500   PROTEINUR NEGATIVE 11/16/2017 1500   UROBILINOGEN 0.2 03/13/2014 1310   NITRITE NEGATIVE 11/16/2017 1500   LEUKOCYTESUR NEGATIVE 11/16/2017 1500    Radiological Exams on Admission: Dg Chest 2 View  Result Date: 11/16/2017 CLINICAL DATA:  Tachycardia, chest pain, shortness of breath EXAM: CHEST  2 VIEW COMPARISON:  04/20/2017 FINDINGS: Lungs are essentially clear. Bibasilar scarring. No focal consolidation. No pleural effusion or pneumothorax. The heart is normal in size. Moderate compression fracture deformity of a mid and lower vertebral body, possibly T5 and T11. Mild anterior wedging of an additional mid thoracic vertebral body, possibly T4. These findings are age indeterminate but new from the prior. IMPRESSION: No evidence of acute cardiopulmonary disease. Mild to moderate compression fracture deformities of three thoracic vertebral bodies, as described above. These findings are age indeterminate but new from 04/20/2017. Electronically Signed   By: Julian Hy M.D.   On: 11/16/2017 11:40   Ct Angio Chest/abd/pel For Dissection W And/or Wo Contrast  Result Date: 11/16/2017 CLINICAL DATA:  Chest and low back pain, initial encounter EXAM: CT ANGIOGRAPHY CHEST, ABDOMEN AND PELVIS TECHNIQUE: Multidetector CT imaging through the chest, abdomen and pelvis was performed using the standard protocol during bolus administration of intravenous contrast. Multiplanar reconstructed images and MIPs were obtained and reviewed to evaluate the vascular anatomy. CONTRAST:  132mL ISOVUE-370 IOPAMIDOL (ISOVUE-370) INJECTION 76% COMPARISON:  None. FINDINGS: CTA CHEST FINDINGS Cardiovascular: The thoracic aorta and its  branches demonstrate mild atherosclerotic changes. No aneurysmal dilatation or dissection is identified. Coronary calcifications are seen. No cardiac enlargement is seen. The pulmonary artery as visualized shows no large central embolus. Mediastinum/Nodes: Thoracic inlet is within normal limits. No hilar or mediastinal adenopathy is identified. The esophagus is within normal limits. Lungs/Pleura: Diffuse emphysematous changes are identified. Mild atelectatic changes are noted in the right lower lobe and lingula. No focal infiltrate or sizable parenchymal nodule is  noted. No pleural effusion is seen. Musculoskeletal: There compression deformities of T5-T6 and T12 identified. These are new from June of 2018 but unchanged from the recent chest x-ray. They have a chronic appearance given significant sclerosis. There is also a fracture of the T5 spinous process identified. Review of the MIP images confirms the above findings. CTA ABDOMEN AND PELVIS FINDINGS VASCULAR Aorta: Diffuse atherosclerotic changes of the aorta are noted without aneurysmal dilatation. Celiac: Mild narrowing is noted proximally with poststenotic dilatation. Atherosclerotic changes are identified. SMA: Mild atherosclerotic changes are noted. No focal stenosis is seen. Renals: Proximal atherosclerotic changes are noted without focal stenosis. IMA: Patent without evidence of aneurysm, dissection, vasculitis or significant stenosis. Iliacs: Diffuse atherosclerotic changes are noted without aneurysmal dilatation or focal stenosis. Veins: No obvious venous abnormality within the limitations of this arterial phase study. Review of the MIP images confirms the above findings. NON-VASCULAR Hepatobiliary: Diffuse decreased attenuation is noted consistent with fatty infiltration. Gallbladder is well distended without cholelithiasis. Pancreas: Unremarkable. No pancreatic ductal dilatation or surrounding inflammatory changes. Spleen: Normal in size without focal  abnormality. Adrenals/Urinary Tract: Adrenal glands are within normal limits. Nonobstructing 2 mm stone is noted in the lower pole of the right kidney. Bladder is well distended. Stomach/Bowel: The appendix is within normal limits without inflammatory change. No obstructive or inflammatory changes in the large and small bowel are noted. Lymphatic: No significant lymphadenopathy is noted. Reproductive: Prostatic calcifications are seen. Other: No abdominal wall hernia or abnormality. No abdominopelvic ascites. Musculoskeletal: Degenerative changes of lumbar spine are noted. No acute compression deformity is seen. Review of the MIP images confirms the above findings. IMPRESSION: No evidence of acute aortic abnormality. Diffuse atherosclerotic changes are noted. Diffuse emphysematous changes. Multiple compression deformities which appear chronic in nature as described. A chronic appearing T5 spinous process fracture is noted as well. Nonobstructing right renal stone. Chronic changes as described above. Electronically Signed   By: Inez Catalina M.D.   On: 11/16/2017 15:19    EKG: Independently reviewed. Tachycardia, but regular, rate- 128bpm, P waves visible- lead II, appears to be of slight differing morphology.  Assessment/Plan Principal Problem:   COPD with emphysema (HCC) Active Problems:   Essential hypertension   Chronic respiratory failure with hypoxia (HCC)   Tachycardia  Acute COPD exacerbation-with increased SOB 2 days, unchanged cough, no purulence.  WBC- 15.  CTA -negative for acute abnormality showed multiple compression deformities which appear chronic. lactic acid negative.  IV Solu-Medrol 125 given in ED. Advanced COPD, follows with pulmonology on chronic 2- 3L home O2.  Marked reduced air entry bilaterally.  BNP 54. -Will use ipratropium and levalbuterol, q6H, if it would help in reducing patient's tachycardia -IV Solu-Medrol 40 twice daily -Patient is DNR, has a living will stating this,  confirmed with patient and son at bedside.  Patient okay with BiPAP needed.  Reports he has never been intubated.  -O2 sats greater than 88%  Fever-T-max 100.1 recorded in ED WBC elevated at 15.  No lactic acidosis. hypotension, tachycardia.  Patient meets SIRS criteria. IV vancomycin and Levaquin started in the ED. UA clean.  No recent hospitalizations. -Flu check negative -Continue broad-spectrum antibiotics with IV vancomycin, IV Zosyn initially ordered discontinued due to penicillin allergy, will use IV aztreonam for gram-negative coverage, with high community resistance to quinolones. -Follow-up blood cultures drawn in ED -Will add on urine cultures  Tachycardia-initial concern for atrial flutter in ED with heart rates up to 150, and was started on IV Cardizem.  Repeat EKG, heart rates in the 120s shows P waves, regular rhythm.  Possibly multifocal atrial tachycardia, in this patient with advanced COPD. 1.5L bolus Ns total given.  TSH normal. -DC IV Cardizem -Echocardiogram - IVF ns at 75cc/hr X 10 hrs  Elevated troponin with hx of CAD- 0.04. NO chest pain.  likely troponin leak//demand in the setting of possible infection, COPD exacerbation, tachycardia.  EKG shows tachycardia. history of coronary artery disease follows with Dr. Bronson Ing, RCA stenting - May, 2015. -Trend troponin -Echocardiogram  Back pain-likely from multiple compression fractures.  No falls.  Home medication 15 mg daily prednisone- pts son thinks this was a new prescription started on Monday.  Compression fractures likely related to repeated steroid therapy. -Pain control -Follow-up as outpatient  DVT prophylaxis: Lovenox Code Status: DNR , confirmed with patient and son at bedside, patient has living will, patient's son brought documentation to the ED.  Family Communication: Psychologist, sport and exercise, son. Has signed documents.  Disposition Plan: to be determined Consults called: None  Admission status: Inpt, Step  down-for hypotension, tachycardia and respiratory distress.   Bethena Roys MD [facility] Pager [phone]  If 11PM-7AM, please contact night-coverage [email or web address] Password [facility]  11/16/2017, 5:26 PM

## 2017-11-16 NOTE — ED Notes (Signed)
Pt resting with eyes shut.  No distress.

## 2017-11-17 ENCOUNTER — Inpatient Hospital Stay (HOSPITAL_COMMUNITY): Payer: Medicare HMO

## 2017-11-17 DIAGNOSIS — R000 Tachycardia, unspecified: Secondary | ICD-10-CM

## 2017-11-17 DIAGNOSIS — J9611 Chronic respiratory failure with hypoxia: Secondary | ICD-10-CM

## 2017-11-17 DIAGNOSIS — R7881 Bacteremia: Secondary | ICD-10-CM

## 2017-11-17 DIAGNOSIS — S22000A Wedge compression fracture of unspecified thoracic vertebra, initial encounter for closed fracture: Secondary | ICD-10-CM

## 2017-11-17 DIAGNOSIS — I10 Essential (primary) hypertension: Secondary | ICD-10-CM

## 2017-11-17 DIAGNOSIS — B9562 Methicillin resistant Staphylococcus aureus infection as the cause of diseases classified elsewhere: Secondary | ICD-10-CM | POA: Diagnosis present

## 2017-11-17 LAB — ECHOCARDIOGRAM COMPLETE
AO mean calculated velocity dopler: 119 cm/s
AOPV: 0.61 m/s
AOVTI: 32.8 cm
AV Area VTI: 1.72 cm2
AV Peak grad: 11 mmHg
AV VEL mean LVOT/AV: 0.58
AV pk vel: 165 cm/s
AVAREAMEANV: 1.66 cm2
AVAREAMEANVIN: 0.94 cm2/m2
AVAREAVTIIND: 0.97 cm2/m2
AVG: 7 mmHg
CHL CUP AV PEAK INDEX: 0.98
CHL CUP AV VEL: 1.7
CHL CUP DOP CALC LVOT VTI: 19.6 cm
E decel time: 114 msec
EERAT: 11.62
FS: 29 % (ref 28–44)
HEIGHTINCHES: 67 in
IV/PV OW: 1.28
LA diam end sys: 31 mm
LA vol index: 17.7 mL/m2
LA vol: 31.2 mL
LADIAMINDEX: 1.76 cm/m2
LASIZE: 31 mm
LAVOLA4C: 31.3 mL
LV E/e'average: 11.62
LV SIMPSON'S DISK: 53
LV TDI E'MEDIAL: 7.18
LV sys vol index: 12 mL/m2
LVDIAVOL: 47 mL — AB (ref 62–150)
LVDIAVOLIN: 27 mL/m2
LVEEMED: 11.62
LVELAT: 6.85 cm/s
LVOT area: 2.84 cm2
LVOT diameter: 19 mm
LVOT peak grad rest: 4 mmHg
LVOTPV: 100 cm/s
LVOTSV: 56 mL
LVOTVTI: 0.6 cm
LVSYSVOL: 22 mL (ref 21–61)
MV Dec: 114
MV Peak grad: 3 mmHg
MV pk E vel: 79.6 m/s
MVPKAVEL: 143 m/s
PW: 12.1 mm — AB (ref 0.6–1.1)
RV LATERAL S' VELOCITY: 7.62 cm/s
RV TAPSE: 16 mm
Stroke v: 25 ml
TDI e' lateral: 6.85
Valve area index: 0.97
Valve area: 1.7 cm2
WEIGHTICAEL: 2313.95 [oz_av]

## 2017-11-17 LAB — BLOOD CULTURE ID PANEL (REFLEXED)
ACINETOBACTER BAUMANNII: NOT DETECTED
CANDIDA ALBICANS: NOT DETECTED
CANDIDA GLABRATA: NOT DETECTED
Candida krusei: NOT DETECTED
Candida parapsilosis: NOT DETECTED
Candida tropicalis: NOT DETECTED
ENTEROBACTER CLOACAE COMPLEX: NOT DETECTED
ENTEROBACTERIACEAE SPECIES: NOT DETECTED
Enterococcus species: NOT DETECTED
Escherichia coli: NOT DETECTED
HAEMOPHILUS INFLUENZAE: NOT DETECTED
KLEBSIELLA OXYTOCA: NOT DETECTED
KLEBSIELLA PNEUMONIAE: NOT DETECTED
Listeria monocytogenes: NOT DETECTED
METHICILLIN RESISTANCE: DETECTED — AB
NEISSERIA MENINGITIDIS: NOT DETECTED
PSEUDOMONAS AERUGINOSA: NOT DETECTED
Proteus species: NOT DETECTED
STREPTOCOCCUS PNEUMONIAE: NOT DETECTED
STREPTOCOCCUS PYOGENES: NOT DETECTED
STREPTOCOCCUS SPECIES: NOT DETECTED
Serratia marcescens: NOT DETECTED
Staphylococcus aureus (BCID): DETECTED — AB
Staphylococcus species: DETECTED — AB
Streptococcus agalactiae: NOT DETECTED

## 2017-11-17 LAB — TROPONIN I: Troponin I: 0.04 ng/mL (ref <0.03)

## 2017-11-17 LAB — MRSA PCR SCREENING: MRSA by PCR: POSITIVE — AB

## 2017-11-17 MED ORDER — HYDROCODONE-ACETAMINOPHEN 5-325 MG PO TABS
1.0000 | ORAL_TABLET | Freq: Four times a day (QID) | ORAL | Status: DC | PRN
  Administered 2017-11-17 – 2017-11-22 (×8): 1 via ORAL
  Filled 2017-11-17 (×9): qty 1

## 2017-11-17 MED ORDER — SODIUM CHLORIDE 0.9 % IV SOLN
INTRAVENOUS | Status: AC
  Administered 2017-11-17: 01:00:00 via INTRAVENOUS

## 2017-11-17 MED ORDER — IPRATROPIUM BROMIDE 0.02 % IN SOLN
0.5000 mg | Freq: Four times a day (QID) | RESPIRATORY_TRACT | Status: DC
  Administered 2017-11-17 – 2017-11-20 (×16): 0.5 mg via RESPIRATORY_TRACT
  Filled 2017-11-17 (×15): qty 2.5

## 2017-11-17 MED ORDER — DEXTROSE 5 % IV SOLN
2.0000 g | Freq: Once | INTRAVENOUS | Status: AC
  Administered 2017-11-17: 2 g via INTRAVENOUS
  Filled 2017-11-17 (×2): qty 2

## 2017-11-17 MED ORDER — VANCOMYCIN HCL IN DEXTROSE 750-5 MG/150ML-% IV SOLN
750.0000 mg | Freq: Two times a day (BID) | INTRAVENOUS | Status: DC
  Administered 2017-11-17 – 2017-11-19 (×4): 750 mg via INTRAVENOUS
  Filled 2017-11-17 (×8): qty 150

## 2017-11-17 MED ORDER — VANCOMYCIN HCL IN DEXTROSE 750-5 MG/150ML-% IV SOLN
750.0000 mg | Freq: Once | INTRAVENOUS | Status: AC
  Administered 2017-11-17: 750 mg via INTRAVENOUS
  Filled 2017-11-17 (×2): qty 150

## 2017-11-17 MED ORDER — LEVALBUTEROL HCL 0.63 MG/3ML IN NEBU
0.6300 mg | INHALATION_SOLUTION | Freq: Four times a day (QID) | RESPIRATORY_TRACT | Status: DC
  Administered 2017-11-17 – 2017-11-20 (×16): 0.63 mg via RESPIRATORY_TRACT
  Filled 2017-11-17 (×16): qty 3

## 2017-11-17 MED ORDER — DEXTROSE 5 % IV SOLN
1.0000 g | Freq: Three times a day (TID) | INTRAVENOUS | Status: DC
  Administered 2017-11-17 – 2017-11-19 (×6): 1 g via INTRAVENOUS
  Filled 2017-11-17 (×13): qty 1

## 2017-11-17 NOTE — Progress Notes (Signed)
  Echocardiogram 2D Echocardiogram has been performed.  Willie Williamson 11/17/2017, 9:54 AM

## 2017-11-17 NOTE — Progress Notes (Signed)
CRITICAL VALUE ALERT  Critical Value:  Blood cultures gram positive in both bottles  Date & Time Notied:  11/17/2017 0215  Provider Notified: Opyd MD  Orders Received/Actions taken: none yet

## 2017-11-17 NOTE — Progress Notes (Addendum)
PROGRESS NOTE    Willie Williamson  FYB:[id number] DOB: 07/03/1931 DOA: 11/16/2017 PCP: [name], MD   Brief Narrative:  Willie Williamson is a 83 y.o. male with medical history significant for emphysema chronic respiratory failure on chronic home O2 2-3 L, CAD, HTN, bladder cancer, BPH,.  Patient presented to the ED with complaints of back pain present over the past year both significantly worsened about 2 days ago. Pt denies falls, no lower extremity weakness.  Patient also endorses worsening difficulty breathing over the past 2 days.  Chronic nonproductive cough unchanged.  No fever no chills at home. Patient also complains of left arm pain, that started here in the ED. No chest pains. Patient son thinks patient was placed on prednisone about a week ago for COPD, and does not think patient is on chronic steroids.  ED Course: Blood pressure initially 133/83, dropped to 79/54, while on Cardizem drip.  Patient also tachycardic on admission pulse of 157.  Temperature max recorded in the ED 100.1.  Patient's O2 increased to 3 L [place], O2 sats greater than 87%.  Chest x-ray negative for acute abnormality, chest CT he negative for acute abnormality but showed multiple compression deformities which appear chronic.  EKG showed what appeared to be atrial flutter. Patient was given Cardizem 10 mg and subsequently placed on Cardizem drip.  Blood pressure dropped.  Patient was given 1 L normal saline bolus.  Patient's tachycardia improved.  Patient was also given 125 mg of Solu-Medrol, placed on antibiotics- vancomycin and Levaquin for fever , with ?focus of infection..   Blood cultures came back positive for MRSA on 11/17/2017.  Infectious disease was reflexively consulted.  Assessment & Plan:   Principal Problem:   COPD with emphysema ([place]) Active Problems:   Essential hypertension   Chronic respiratory failure with hypoxia (H[facility])   Tachycardia  Sepsis 2/2 MRSA bacteremia -No acute source  identified -Infectious disease reflex consultation -Patient will likely benefit from a TEE -Cardiology consult placed for TEE -MRI of the spine ordered -Continue vancomycin - Repeat blood cultures tomorrow -Will likely need PICC line placement and long-term IV antibiotics after second blood cultures negative - Echocardiogram showing There is an ECHO dense structure that is mildly   mobile that intermittently appears in the LVOT under the aortic   valve but not attached to the valve. Suggest TEE to further   define. The cavity size was normal. There was mild concentric   hypertrophy. Systolic function was normal. The estimated ejection   fraction was in the range of 55% to 60%. Wall motion was normal;   there were no regional wall motion abnormalities - called cardiology fellow on call as there is no cardiology service available over the weekend here- awaiting call back  Tachycardia -Likely due to sepsis -Was placed on IV Cardizem but became hypotensive in the emergency department -Cardizem discontinued  Elevated troponin with hx of CAD- 0.04.  -Troponins plateaued  Back pain -MRI of the lumbar spine ordered     DVT prophylaxis: Lovenox Code Status: DNR Family Communication: Son is bedside Disposition Plan: Pending further workup   Consultants:   Infectious disease  Cardiology  Procedures:   Echocardiogram  Antimicrobials:   Vancomycin and Levaquin   Subjective: Patient seen this morning.  His son is bedside.  He was getting his echocardiogram at time of evaluation.  Patient and son are very concerned about the positive MRSA and patient's blood cultures.  Patient's son requesting further workup of  patient's back pain.  Objective: Vitals:   11/17/17 1100 11/17/17 1200 11/17/17 1300 11/17/17 1437  BP: 120/80  124/88   Pulse: 90 (!) 108 (!) 101   Resp: 18 (!) 23 17   Temp:      TempSrc:      SpO2: 94% 95% 97% 97%  Weight:      Height:         Intake/Output Summary (Last 24 hours) at 11/17/2017 1640 Last data filed at 11/17/2017 1300 Gross per 24 hour  Intake 1209.08 ml  Output -  Net 1209.08 ml   Filed Weights   11/16/17 1029 11/16/17 2319 11/17/17 0400  Weight: 66.2 kg (146 lb) 65 kg (143 lb 4.8 oz) 65.6 kg (144 lb 10 oz)    Examination:  General exam: Appears calm and comfortable  Respiratory system: Clear to auscultation. Respiratory effort normal. Cardiovascular system: S1 & S2 heard, RRR. No JVD, murmurs, rubs, gallops or clicks. No pedal edema. Gastrointestinal system: Abdomen is nondistended, soft and nontender. No organomegaly or masses felt. Normal bowel sounds heard. Central nervous system: Alert and oriented. No focal neurological deficits. Extremities: Symmetric 5 x 5 power. Skin: No rashes, lesions or ulcers Psychiatry: Judgement and insight appear normal. Mood & affect appropriate.     Data Reviewed: I have personally reviewed following labs and imaging studies  CBC: Recent Labs  Lab 11/16/17 1039  WBC 15.0*  HGB 12.5*  HCT 39.1  MCV 96.3  PLT 630   Basic Metabolic Panel: Recent Labs  Lab 11/16/17 1039  NA 135  K 4.2  CL 98*  CO2 26  GLUCOSE 106*  BUN 19  CREATININE 0.82  CALCIUM 8.9   GFR: Estimated Creatinine Clearance: 62.2 mL/min (by C-G formula based on SCr of 0.82 mg/dL). Liver Function Tests: Recent Labs  Lab 11/16/17 1058  AST 32  ALT 40  ALKPHOS 110  BILITOT 1.0  PROT 7.1  ALBUMIN 3.7   No results for input(s): LIPASE, AMYLASE in the last 168 hours. No results for input(s): AMMONIA in the last 168 hours. Coagulation Profile: No results for input(s): INR, PROTIME in the last 168 hours. Cardiac Enzymes: Recent Labs  Lab 11/16/17 1039 11/16/17 1740 11/17/17 0026  TROPONINI <0.03 0.04* 0.04*   BNP (last 3 results) No results for input(s): PROBNP in the last 8760 hours. HbA1C: No results for input(s): HGBA1C in the last 72 hours. CBG: No results for  input(s): GLUCAP in the last 168 hours. Lipid Profile: No results for input(s): CHOL, HDL, LDLCALC, TRIG, CHOLHDL, LDLDIRECT in the last 72 hours. Thyroid Function Tests: Recent Labs    11/16/17 1058  TSH 1.778  FREET4 1.42*   Anemia Panel: No results for input(s): VITAMINB12, FOLATE, FERRITIN, TIBC, IRON, RETICCTPCT in the last 72 hours. Sepsis Labs: Recent Labs  Lab 11/16/17 1107  LATICACIDVEN 1.85    Recent Results (from the past 240 hour(s))  Culture, blood (Routine X 2) w Reflex to ID Panel     Status: None (Preliminary result)   Collection Time: 11/16/17 11:07 AM  Result Value Ref Range Status   Specimen Description   Final    LEFT ANTECUBITAL BOTTLES DRAWN AEROBIC AND ANAEROBIC   Special Requests Blood Culture adequate volume  Final   Culture  Setup Time   Final    GRAM POSITIVE COCCI Gram Stain Report Called to,Read Back By and Verified With: HOWARD,C @ 1601 ON 11/17/17 BY JUW GS DONE @ [facility] ANAEROBIC BOTTLE ONLY CRITICAL RESULT CALLED TO,  READ BACK BY AND VERIFIED WITH: S HALL,PHARMD AT 5366 11/17/17 BY L BENFIELD Performed at [facility], [place] [address], [place], [place] [address]    Culture GRAM POSITIVE COCCI  Final   Report Status PENDING  Incomplete  Blood Culture ID Panel (Reflexed)     Status: Abnormal   Collection Time: 11/16/17 11:07 AM  Result Value Ref Range Status   Enterococcus species NOT DETECTED NOT DETECTED Final   Listeria monocytogenes NOT DETECTED NOT DETECTED Final   Staphylococcus species DETECTED (A) NOT DETECTED Final    Comment: CRITICAL RESULT CALLED TO, READ BACK BY AND VERIFIED WITH: S HALL,PHARMD AT 7425 11/17/17 BY L BENFIELD    Staphylococcus aureus DETECTED (A) NOT DETECTED Final    Comment: Methicillin (oxacillin)-resistant Staphylococcus aureus (MRSA). MRSA is predictably resistant to beta-lactam antibiotics (except ceftaroline). Preferred therapy is vancomycin unless clinically contraindicated. Patient requires contact  precautions if  hospitalized. CRITICAL RESULT CALLED TO, READ BACK BY AND VERIFIED WITH: S HALL,PHARMD AT 0756 11/17/17 BY L BENFIELD    Methicillin resistance DETECTED (A) NOT DETECTED Final    Comment: CRITICAL RESULT CALLED TO, READ BACK BY AND VERIFIED WITH: S HALL,PHARMD AT 0756 11/17/17 BY L BENFIELD    Streptococcus species NOT DETECTED NOT DETECTED Final   Streptococcus agalactiae NOT DETECTED NOT DETECTED Final   Streptococcus pneumoniae NOT DETECTED NOT DETECTED Final   Streptococcus pyogenes NOT DETECTED NOT DETECTED Final   Acinetobacter baumannii NOT DETECTED NOT DETECTED Final   Enterobacteriaceae species NOT DETECTED NOT DETECTED Final   Enterobacter cloacae complex NOT DETECTED NOT DETECTED Final   Escherichia coli NOT DETECTED NOT DETECTED Final   Klebsiella oxytoca NOT DETECTED NOT DETECTED Final   Klebsiella pneumoniae NOT DETECTED NOT DETECTED Final   Proteus species NOT DETECTED NOT DETECTED Final   Serratia marcescens NOT DETECTED NOT DETECTED Final   Haemophilus influenzae NOT DETECTED NOT DETECTED Final   Neisseria meningitidis NOT DETECTED NOT DETECTED Final   Pseudomonas aeruginosa NOT DETECTED NOT DETECTED Final   Candida albicans NOT DETECTED NOT DETECTED Final   Candida glabrata NOT DETECTED NOT DETECTED Final   Candida krusei NOT DETECTED NOT DETECTED Final   Candida parapsilosis NOT DETECTED NOT DETECTED Final   Candida tropicalis NOT DETECTED NOT DETECTED Final    Comment: Performed at [facility], [place]. [address], [place], [place] [address]  Culture, blood (Routine X 2) w Reflex to ID Panel     Status: None (Preliminary result)   Collection Time: 11/16/17 11:08 AM  Result Value Ref Range Status   Specimen Description BLOOD LEFT ARM BOTTLES DRAWN AEROBIC AND ANAEROBIC  Final   Special Requests Blood Culture adequate volume  Final   Culture  Setup Time   Final    GRAM POSITIVE COCCI Gram Stain Report Called to,Read Back By and Verified With:  HOWARD,C @  7564 ON 11/17/17 BY JUW GS DONE @ [facility] ANAEROBIC BOTTLE ONLY    Culture GRAM POSITIVE COCCI  Final   Report Status PENDING  Incomplete  MRSA PCR Screening     Status: Abnormal   Collection Time: 11/16/17 11:05 PM  Result Value Ref Range Status   MRSA by PCR POSITIVE (A) NEGATIVE Final    Comment:        The GeneXpert MRSA Assay (FDA approved for NASAL specimens only), is one component of a comprehensive MRSA colonization surveillance program. It is not intended to diagnose MRSA infection nor to guide or monitor treatment for  MRSA infections. RESULT CALLED TO, READ BACK BY AND VERIFIED WITH: JEFFERSON V. @ 4098 ON [id number] BY HENDERSON L.          Radiology Studies: Dg Chest 2 View  Result Date: 11/16/2017 CLINICAL DATA:  Tachycardia, chest pain, shortness of breath EXAM: CHEST  2 VIEW COMPARISON:  04/20/2017 FINDINGS: Lungs are essentially clear. Bibasilar scarring. No focal consolidation. No pleural effusion or pneumothorax. The heart is normal in size. Moderate compression fracture deformity of a mid and lower vertebral body, possibly T5 and T11. Mild anterior wedging of an additional mid thoracic vertebral body, possibly T4. These findings are age indeterminate but new from the prior. IMPRESSION: No evidence of acute cardiopulmonary disease. Mild to moderate compression fracture deformities of three thoracic vertebral bodies, as described above. These findings are age indeterminate but new from 04/20/2017. Electronically Signed   By: Julian Hy M.D.   On: 11/16/2017 11:40   Ct Angio Chest/abd/pel For Dissection W And/or Wo Contrast  Result Date: 11/16/2017 CLINICAL DATA:  Chest and low back pain, initial encounter EXAM: CT ANGIOGRAPHY CHEST, ABDOMEN AND PELVIS TECHNIQUE: Multidetector CT imaging through the chest, abdomen and pelvis was performed using the standard protocol during bolus administration of intravenous contrast. Multiplanar reconstructed images  and MIPs were obtained and reviewed to evaluate the vascular anatomy. CONTRAST:  132mL ISOVUE-370 IOPAMIDOL (ISOVUE-370) INJECTION 76% COMPARISON:  None. FINDINGS: CTA CHEST FINDINGS Cardiovascular: The thoracic aorta and its branches demonstrate mild atherosclerotic changes. No aneurysmal dilatation or dissection is identified. Coronary calcifications are seen. No cardiac enlargement is seen. The pulmonary artery as visualized shows no large central embolus. Mediastinum/Nodes: Thoracic inlet is within normal limits. No hilar or mediastinal adenopathy is identified. The esophagus is within normal limits. Lungs/Pleura: Diffuse emphysematous changes are identified. Mild atelectatic changes are noted in the right lower lobe and lingula. No focal infiltrate or sizable parenchymal nodule is noted. No pleural effusion is seen. Musculoskeletal: There compression deformities of T5-T6 and T12 identified. These are new from June of 2018 but unchanged from the recent chest x-ray. They have a chronic appearance given significant sclerosis. There is also a fracture of the T5 spinous process identified. Review of the MIP images confirms the above findings. CTA ABDOMEN AND PELVIS FINDINGS VASCULAR Aorta: Diffuse atherosclerotic changes of the aorta are noted without aneurysmal dilatation. Celiac: Mild narrowing is noted proximally with poststenotic dilatation. Atherosclerotic changes are identified. SMA: Mild atherosclerotic changes are noted. No focal stenosis is seen. Renals: Proximal atherosclerotic changes are noted without focal stenosis. IMA: Patent without evidence of aneurysm, dissection, vasculitis or significant stenosis. Iliacs: Diffuse atherosclerotic changes are noted without aneurysmal dilatation or focal stenosis. Veins: No obvious venous abnormality within the limitations of this arterial phase study. Review of the MIP images confirms the above findings. NON-VASCULAR Hepatobiliary: Diffuse decreased attenuation is  noted consistent with fatty infiltration. Gallbladder is well distended without cholelithiasis. Pancreas: Unremarkable. No pancreatic ductal dilatation or surrounding inflammatory changes. Spleen: Normal in size without focal abnormality. Adrenals/Urinary Tract: Adrenal glands are within normal limits. Nonobstructing 2 mm stone is noted in the lower pole of the right kidney. Bladder is well distended. Stomach/Bowel: The appendix is within normal limits without inflammatory change. No obstructive or inflammatory changes in the large and small bowel are noted. Lymphatic: No significant lymphadenopathy is noted. Reproductive: Prostatic calcifications are seen. Other: No abdominal wall hernia or abnormality. No abdominopelvic ascites. Musculoskeletal: Degenerative changes of lumbar spine are noted. No acute compression deformity is seen. Review of the  MIP images confirms the above findings. IMPRESSION: No evidence of acute aortic abnormality. Diffuse atherosclerotic changes are noted. Diffuse emphysematous changes. Multiple compression deformities which appear chronic in nature as described. A chronic appearing T5 spinous process fracture is noted as well. Nonobstructing right renal stone. Chronic changes as described above. Electronically Signed   By: Inez Catalina M.D.   On: 11/16/2017 15:19        Scheduled Meds: . aspirin EC  81 mg Oral Daily  . carvedilol  3.125 mg Oral BID WC  . heparin  5,000 Units Subcutaneous Q8H  . ipratropium  0.5 mg Nebulization Q6H  . levalbuterol  0.63 mg Nebulization Q6H  . levothyroxine  88 mcg Oral QAC breakfast  . methylPREDNISolone (SOLU-MEDROL) injection  40 mg Intravenous Q12H  . mometasone-formoterol  2 puff Inhalation BID  . pravastatin  10 mg Oral q1800  . sodium chloride flush  3 mL Intravenous Q12H   Continuous Infusions: . aztreonam Stopped (11/17/17 1100)  . vancomycin       LOS: 1 day    Time spent: 45 minutes    Loretha Stapler, MD [facility] Pager [phone]  If 7PM-7AM, please contact night-coverage [email or web address] Password [email or web address] 11/17/2017, 4:40 PM

## 2017-11-17 NOTE — Progress Notes (Signed)
PHARMACY - PHYSICIAN COMMUNICATION CRITICAL VALUE ALERT - BLOOD CULTURE IDENTIFICATION (BCID)  Willie Williamson is an 83 y.o. male who presented to [facility] on 11/16/2017 with a chief complaint of Fever  Assessment:  Lab called with positive blood cultures  Name of physician (or Provider) Contacted: Dr Denton Brick  Current antibiotics: Vancomycin 750mg  IV q12hrs and Azactam 1gm IV q8h  Changes to prescribed antibiotics recommended:  Patient is on recommended antibiotics - No changes needed  Results for orders placed or performed during the hospital encounter of 11/16/17  Blood Culture ID Panel (Reflexed) (Collected: 11/16/2017 11:07 AM)  Result Value Ref Range   Enterococcus species NOT DETECTED NOT DETECTED   Listeria monocytogenes NOT DETECTED NOT DETECTED   Staphylococcus species DETECTED (A) NOT DETECTED   Staphylococcus aureus DETECTED (A) NOT DETECTED   Methicillin resistance DETECTED (A) NOT DETECTED   Streptococcus species NOT DETECTED NOT DETECTED   Streptococcus agalactiae NOT DETECTED NOT DETECTED   Streptococcus pneumoniae NOT DETECTED NOT DETECTED   Streptococcus pyogenes NOT DETECTED NOT DETECTED   Acinetobacter baumannii NOT DETECTED NOT DETECTED   Enterobacteriaceae species NOT DETECTED NOT DETECTED   Enterobacter cloacae complex NOT DETECTED NOT DETECTED   Escherichia coli NOT DETECTED NOT DETECTED   Klebsiella oxytoca NOT DETECTED NOT DETECTED   Klebsiella pneumoniae NOT DETECTED NOT DETECTED   Proteus species NOT DETECTED NOT DETECTED   Serratia marcescens NOT DETECTED NOT DETECTED   Haemophilus influenzae NOT DETECTED NOT DETECTED   Neisseria meningitidis NOT DETECTED NOT DETECTED   Pseudomonas aeruginosa NOT DETECTED NOT DETECTED   Candida albicans NOT DETECTED NOT DETECTED   Candida glabrata NOT DETECTED NOT DETECTED   Candida krusei NOT DETECTED NOT DETECTED   Candida parapsilosis NOT DETECTED NOT DETECTED   Candida tropicalis NOT DETECTED NOT DETECTED     [name] A 11/17/2017  8:10 AM

## 2017-11-17 NOTE — Plan of Care (Signed)
Care plans progressing

## 2017-11-17 NOTE — Consult Note (Signed)
[facility] for Infectious Disease  Total days of antibiotics 2  Reason for Consult: MRSA bacteremia  Referring Physician: autoconsult  Principal Problem:   COPD with emphysema ([place]) Active Problems:   Essential hypertension   Chronic respiratory failure with hypoxia (HCC)   Tachycardia    HPI: Willie Williamson is a 83 y.o. male with CAD, COPD, HTN, admitted on 12/28 for worsening shortness of breath, fevers, but also back pain. He was found to be febrile, hypotensive and labs showed WBC of 15K, but normal lactic acid. He had infectious work up and started to be treated for acute copd exacerbation. His blood cx returned with + MRSA  Past Medical History:  Diagnosis Date  . Anemia   . CAD (coronary artery disease)    a. 03/2014 - s/p DES to RCA, nonobstructive disease in LAD/Cx, EF 60%.  . Cataract, congenital    "left eye"  . COPD (chronic obstructive pulmonary disease) with emphysema ([place])    a. Chronic resp failure on home O2 (2L).  . H/O hiatal hernia   . HTN (hypertension)   . Hyperlipidemia   . Hypothyroidism   . Malignant neoplasm of bladder, part unspecified    "it was cured"     Allergies:  Allergies  Allergen Reactions  . Penicillins Rash    Has patient had a PCN reaction causing immediate rash, facial/tongue/throat swelling, SOB or lightheadedness with hypotension: Yes Has patient had a PCN reaction causing severe rash involving mucus membranes or skin necrosis: No Has patient had a PCN reaction that required hospitalization: No Has patient had a PCN reaction occurring within the last 10 years: No If all of the above answers are "NO", then may proceed with Cephalosporin use.     MEDICATIONS: . aspirin EC  81 mg Oral Daily  . carvedilol  3.125 mg Oral BID WC  . heparin  5,000 Units Subcutaneous Q8H  . ipratropium  0.5 mg Nebulization Q6H  . levalbuterol  0.63 mg Nebulization Q6H  . levothyroxine  88 mcg Oral QAC breakfast  . methylPREDNISolone  (SOLU-MEDROL) injection  40 mg Intravenous Q12H  . mometasone-formoterol  2 puff Inhalation BID  . pravastatin  10 mg Oral q1800  . sodium chloride flush  3 mL Intravenous Q12H    Social History   Tobacco Use  . Smoking status: Former Smoker    Packs/day: 1.50    Years: 48.00    Pack years: 72.00    Types: Cigarettes    Start date: 01/03/1949    Last attempt to quit: 11/20/1996    Years since quitting: 21.0  . Smokeless tobacco: Never Used  Substance Use Topics  . Alcohol use: No    Alcohol/week: 0.0 oz  . Drug use: No    Family History  Problem Relation Age of Onset  . Bone cancer Mother   . Hypertension Father   . Lung disease Neg Hx        LABS: Results for orders placed or performed during the hospital encounter of 11/16/17 (from the past 48 hour(s))  Basic metabolic panel     Status: Abnormal   Collection Time: 11/16/17 10:39 AM  Result Value Ref Range   Sodium 135 135 - 145 mmol/L   Potassium 4.2 3.5 - 5.1 mmol/L   Chloride 98 (L) 101 - 111 mmol/L   CO2 26 22 - 32 mmol/L   Glucose, Bld 106 (H) 65 - 99 mg/dL   BUN 19 6 - 20 mg/dL  Creatinine, Ser 0.82 0.61 - 1.24 mg/dL   Calcium 8.9 8.9 - 10.3 mg/dL   GFR calc non Af Amer >60 >60 mL/min   GFR calc Af Amer >60 >60 mL/min    Comment: (NOTE) The eGFR has been calculated using the CKD EPI equation. This calculation has not been validated in all clinical situations. eGFR's persistently <60 mL/min signify possible Chronic Kidney Disease.    Anion gap 11 5 - 15  CBC     Status: Abnormal   Collection Time: 11/16/17 10:39 AM  Result Value Ref Range   WBC 15.0 (H) 4.0 - 10.5 K/uL   RBC 4.06 (L) 4.22 - 5.81 MIL/uL   Hemoglobin 12.5 (L) 13.0 - 17.0 g/dL   HCT 39.1 39.0 - 52.0 %   MCV 96.3 78.0 - 100.0 fL   MCH 30.8 26.0 - 34.0 pg   MCHC 32.0 30.0 - 36.0 g/dL   RDW 13.8 11.5 - 15.5 %   Platelets 237 150 - 400 K/uL  Troponin I     Status: None   Collection Time: 11/16/17 10:39 AM  Result Value Ref Range    Troponin I <0.03 <0.03 ng/mL  Hepatic function panel     Status: None   Collection Time: 11/16/17 10:58 AM  Result Value Ref Range   Total Protein 7.1 6.5 - 8.1 g/dL   Albumin 3.7 3.5 - 5.0 g/dL   AST 32 15 - 41 U/L   ALT 40 17 - 63 U/L   Alkaline Phosphatase 110 38 - 126 U/L   Total Bilirubin 1.0 0.3 - 1.2 mg/dL   Bilirubin, Direct 0.2 0.1 - 0.5 mg/dL   Indirect Bilirubin 0.8 0.3 - 0.9 mg/dL  TSH     Status: None   Collection Time: 11/16/17 10:58 AM  Result Value Ref Range   TSH 1.778 0.350 - 4.500 uIU/mL    Comment: Performed by a 3rd Generation assay with a functional sensitivity of <=0.01 uIU/mL.  T4, free     Status: Abnormal   Collection Time: 11/16/17 10:58 AM  Result Value Ref Range   Free T4 1.42 (H) 0.61 - 1.12 ng/dL    Comment: (NOTE) Biotin ingestion may interfere with free T4 tests. If the results are inconsistent with the TSH level, previous test results, or the clinical presentation, then consider biotin interference. If needed, order repeat testing after stopping biotin. Performed at [facility], [place] [address], [place], [place] [address]   Brain natriuretic peptide     Status: None   Collection Time: 11/16/17 10:58 AM  Result Value Ref Range   B Natriuretic Peptide 54.0 0.0 - 100.0 pg/mL  Culture, blood (Routine X 2) w Reflex to ID Panel     Status: None (Preliminary result)   Collection Time: 11/16/17 11:07 AM  Result Value Ref Range   Specimen Description      LEFT ANTECUBITAL BOTTLES DRAWN AEROBIC AND ANAEROBIC   Special Requests Blood Culture adequate volume    Culture  Setup Time      GRAM POSITIVE COCCI Gram Stain Report Called to,Read Back By and Verified With: HOWARD,C @ 2549 ON 11/17/17 BY JUW GS DONE @ [facility] ANAEROBIC BOTTLE ONLY CRITICAL RESULT CALLED TO, READ BACK BY AND VERIFIED WITH: S HALL,PHARMD AT 8264 11/17/17 BY L BENFIELD Performed at [facility], [place] [address] [place], [place] [address]    Culture GRAM POSITIVE COCCI      Report Status PENDING   I-Stat CG4 Lactic  Acid, ED     Status: None   Collection Time: 11/16/17 11:07 AM  Result Value Ref Range   Lactic Acid, Venous 1.85 0.5 - 1.9 mmol/L  Blood Culture ID Panel (Reflexed)     Status: Abnormal   Collection Time: 11/16/17 11:07 AM  Result Value Ref Range   Enterococcus species NOT DETECTED NOT DETECTED   Listeria monocytogenes NOT DETECTED NOT DETECTED   Staphylococcus species DETECTED (A) NOT DETECTED    Comment: CRITICAL RESULT CALLED TO, READ BACK BY AND VERIFIED WITH: S HALL,PHARMD AT 3646 11/17/17 BY L BENFIELD    Staphylococcus aureus DETECTED (A) NOT DETECTED    Comment: Methicillin (oxacillin)-resistant Staphylococcus aureus (MRSA). MRSA is predictably resistant to beta-lactam antibiotics (except ceftaroline). Preferred therapy is vancomycin unless clinically contraindicated. Patient requires contact precautions if  hospitalized. CRITICAL RESULT CALLED TO, READ BACK BY AND VERIFIED WITH: S HALL,PHARMD AT 0756 11/17/17 BY L BENFIELD    Methicillin resistance DETECTED (A) NOT DETECTED    Comment: CRITICAL RESULT CALLED TO, READ BACK BY AND VERIFIED WITH: S HALL,PHARMD AT 0756 11/17/17 BY L BENFIELD    Streptococcus species NOT DETECTED NOT DETECTED   Streptococcus agalactiae NOT DETECTED NOT DETECTED   Streptococcus pneumoniae NOT DETECTED NOT DETECTED   Streptococcus pyogenes NOT DETECTED NOT DETECTED   Acinetobacter baumannii NOT DETECTED NOT DETECTED   Enterobacteriaceae species NOT DETECTED NOT DETECTED   Enterobacter cloacae complex NOT DETECTED NOT DETECTED   Escherichia coli NOT DETECTED NOT DETECTED   Klebsiella oxytoca NOT DETECTED NOT DETECTED   Klebsiella pneumoniae NOT DETECTED NOT DETECTED   Proteus species NOT DETECTED NOT DETECTED   Serratia marcescens NOT DETECTED NOT DETECTED   Haemophilus influenzae NOT DETECTED NOT DETECTED   Neisseria meningitidis NOT DETECTED NOT DETECTED   Pseudomonas aeruginosa NOT DETECTED NOT  DETECTED   Candida albicans NOT DETECTED NOT DETECTED   Candida glabrata NOT DETECTED NOT DETECTED   Candida krusei NOT DETECTED NOT DETECTED   Candida parapsilosis NOT DETECTED NOT DETECTED   Candida tropicalis NOT DETECTED NOT DETECTED    Comment: Performed at [facility], [address], [place], [place] [address]  Culture, blood (Routine X 2) w Reflex to ID Panel     Status: None (Preliminary result)   Collection Time: 11/16/17 11:08 AM  Result Value Ref Range   Specimen Description BLOOD LEFT ARM BOTTLES DRAWN AEROBIC AND ANAEROBIC    Special Requests Blood Culture adequate volume    Culture  Setup Time      GRAM POSITIVE COCCI Gram Stain Report Called to,Read Back By and Verified With: HOWARD,C @  2248 ON 11/17/17 BY JUW GS DONE @ [facility] ANAEROBIC BOTTLE ONLY    Culture GRAM POSITIVE COCCI    Report Status PENDING   Urinalysis, Routine w reflex microscopic     Status: None   Collection Time: 11/16/17  3:00 PM  Result Value Ref Range   Color, Urine YELLOW YELLOW   APPearance CLEAR CLEAR   Specific Gravity, Urine 1.021 1.005 - 1.030   pH 6.0 5.0 - 8.0   Glucose, UA NEGATIVE NEGATIVE mg/dL   Hgb urine dipstick NEGATIVE NEGATIVE   Bilirubin Urine NEGATIVE NEGATIVE   Ketones, ur NEGATIVE NEGATIVE mg/dL   Protein, ur NEGATIVE NEGATIVE mg/dL   Nitrite NEGATIVE NEGATIVE   Leukocytes, UA NEGATIVE NEGATIVE  Troponin I (q 6hr x 3)     Status: Abnormal   Collection Time: 11/16/17  5:40 PM  Result Value Ref Range  Troponin I 0.04 (HH) <0.03 ng/mL    Comment: CRITICAL RESULT CALLED TO, READ BACK BY AND VERIFIED WITH: JJ,RN @ 2440 ON 12.28.18 BY BOWMAN,L   Influenza panel by PCR (type A & B)     Status: None   Collection Time: 11/16/17  5:49 PM  Result Value Ref Range   Influenza A By PCR NEGATIVE NEGATIVE   Influenza B By PCR NEGATIVE NEGATIVE    Comment: (NOTE) The Xpert Xpress Flu assay is intended as an aid in the diagnosis of  influenza and should not be used as a  sole basis for treatment.  This  assay is FDA approved for nasopharyngeal swab specimens only. Nasal  washings and aspirates are unacceptable for Xpert Xpress Flu testing.   MRSA PCR Screening     Status: Abnormal   Collection Time: 11/16/17 11:05 PM  Result Value Ref Range   MRSA by PCR POSITIVE (A) NEGATIVE    Comment:        The GeneXpert MRSA Assay (FDA approved for NASAL specimens only), is one component of a comprehensive MRSA colonization surveillance program. It is not intended to diagnose MRSA infection nor to guide or monitor treatment for MRSA infections. RESULT CALLED TO, READ BACK BY AND VERIFIED WITH: JEFFERSON V. @ 1027 ON [id number] BY HENDERSON L.   Troponin I (q 6hr x 3)     Status: Abnormal   Collection Time: 11/17/17 12:26 AM  Result Value Ref Range   Troponin I 0.04 (HH) <0.03 ng/mL    Comment: CRITICAL VALUE NOTED.  VALUE IS CONSISTENT WITH PREVIOUSLY REPORTED AND CALLED VALUE.    MICRO: 12/28 blood cx MRSA in 2 sets IMAGING: Dg Chest 2 View  Result Date: 11/16/2017 CLINICAL DATA:  Tachycardia, chest pain, shortness of breath EXAM: CHEST  2 VIEW COMPARISON:  04/20/2017 FINDINGS: Lungs are essentially clear. Bibasilar scarring. No focal consolidation. No pleural effusion or pneumothorax. The heart is normal in size. Moderate compression fracture deformity of a mid and lower vertebral body, possibly T5 and T11. Mild anterior wedging of an additional mid thoracic vertebral body, possibly T4. These findings are age indeterminate but new from the prior. IMPRESSION: No evidence of acute cardiopulmonary disease. Mild to moderate compression fracture deformities of three thoracic vertebral bodies, as described above. These findings are age indeterminate but new from 04/20/2017. Electronically Signed   By: Julian Hy M.D.   On: 11/16/2017 11:40   Ct Angio Chest/abd/pel For Dissection W And/or Wo Contrast  Result Date: 11/16/2017 CLINICAL DATA:  Chest and low  back pain, initial encounter EXAM: CT ANGIOGRAPHY CHEST, ABDOMEN AND PELVIS TECHNIQUE: Multidetector CT imaging through the chest, abdomen and pelvis was performed using the standard protocol during bolus administration of intravenous contrast. Multiplanar reconstructed images and MIPs were obtained and reviewed to evaluate the vascular anatomy. CONTRAST:  135m ISOVUE-370 IOPAMIDOL (ISOVUE-370) INJECTION 76% COMPARISON:  None. FINDINGS: CTA CHEST FINDINGS Cardiovascular: The thoracic aorta and its branches demonstrate mild atherosclerotic changes. No aneurysmal dilatation or dissection is identified. Coronary calcifications are seen. No cardiac enlargement is seen. The pulmonary artery as visualized shows no large central embolus. Mediastinum/Nodes: Thoracic inlet is within normal limits. No hilar or mediastinal adenopathy is identified. The esophagus is within normal limits. Lungs/Pleura: Diffuse emphysematous changes are identified. Mild atelectatic changes are noted in the right lower lobe and lingula. No focal infiltrate or sizable parenchymal nodule is noted. No pleural effusion is seen. Musculoskeletal: There compression deformities of T5-T6 and T12 identified. These are  new from June of 2018 but unchanged from the recent chest x-ray. They have a chronic appearance given significant sclerosis. There is also a fracture of the T5 spinous process identified. Review of the MIP images confirms the above findings. CTA ABDOMEN AND PELVIS FINDINGS VASCULAR Aorta: Diffuse atherosclerotic changes of the aorta are noted without aneurysmal dilatation. Celiac: Mild narrowing is noted proximally with poststenotic dilatation. Atherosclerotic changes are identified. SMA: Mild atherosclerotic changes are noted. No focal stenosis is seen. Renals: Proximal atherosclerotic changes are noted without focal stenosis. IMA: Patent without evidence of aneurysm, dissection, vasculitis or significant stenosis. Iliacs: Diffuse  atherosclerotic changes are noted without aneurysmal dilatation or focal stenosis. Veins: No obvious venous abnormality within the limitations of this arterial phase study. Review of the MIP images confirms the above findings. NON-VASCULAR Hepatobiliary: Diffuse decreased attenuation is noted consistent with fatty infiltration. Gallbladder is well distended without cholelithiasis. Pancreas: Unremarkable. No pancreatic ductal dilatation or surrounding inflammatory changes. Spleen: Normal in size without focal abnormality. Adrenals/Urinary Tract: Adrenal glands are within normal limits. Nonobstructing 2 mm stone is noted in the lower pole of the right kidney. Bladder is well distended. Stomach/Bowel: The appendix is within normal limits without inflammatory change. No obstructive or inflammatory changes in the large and small bowel are noted. Lymphatic: No significant lymphadenopathy is noted. Reproductive: Prostatic calcifications are seen. Other: No abdominal wall hernia or abnormality. No abdominopelvic ascites. Musculoskeletal: Degenerative changes of lumbar spine are noted. No acute compression deformity is seen. Review of the MIP images confirms the above findings. IMPRESSION: No evidence of acute aortic abnormality. Diffuse atherosclerotic changes are noted. Diffuse emphysematous changes. Multiple compression deformities which appear chronic in nature as described. A chronic appearing T5 spinous process fracture is noted as well. Nonobstructing right renal stone. Chronic changes as described above. Electronically Signed   By: Inez Catalina M.D.   On: 11/16/2017 15:19     Assessment/Plan:  83yo M found to have MRSA bacteremia Would narrow abtx to vancomycin alone Appears to be spontaneous - no overt source. Recommend TEE if feasible, and MRI of spine, please repeat blood cx tomorrow       [facility] Staphylococcus aureus bacteremia   Staphylococcus aureus bacteremia  (SAB) is associated with a high rate of complications and mortality.  Specific aspects of clinical management are critical to optimizing the outcome of patients with SAB.  Therefore, the [facility] Antimicrobial Management Team [facility]) has initiated an intervention aimed at improving the management of SAB at [facility].  To do so, Infectious Diseases physicians are providing an evidence-based consult for the management of all patients with SAB.     Yes No Comments  Perform follow-up blood cultures (even if the patient is afebrile) to ensure clearance of bacteremia '[x]'$  '[]'$  Please repeat blood cx tomorrow am  Remove vascular catheter and obtain follow-up blood cultures after the removal of the catheter '[]'$  '[]'$  Do not place any picc line at this time until bacteremia is cleared  Perform echocardiography to evaluate for endocarditis (transthoracic ECHO is 40-50% sensitive, TEE is > 90% sensitive) '[x]'$  '[]'$  Please keep in mind, that neither test can definitively EXCLUDE endocarditis, and that should clinical suspicion remain high for endocarditis the patient should then still be treated with an "endocarditis" duration of therapy = 6 weeks       Ensure source control '[]'$  '[]'$  Have all abscesses been drained effectively? Have deep seeded infections (septic joints or osteomyelitis) had appropriate surgical debridement?  Investigate  for "metastatic" sites of infection '[x]'$  '[]'$  Recommend MRI of spine  Change antibiotic therapy to ____VANCOMYCIN________ '[]'$  '[]'$  Beta-lactam antibiotics are preferred for MSSA due to higher cure rates.   If on Vancomycin, goal trough should be 15 - 20 mcg/mL  Estimated duration of IV antibiotic therapy:  To be determined '[]'$  '[]'$  Consult case management for probably prolonged outpatient IV antibiotic therapy

## 2017-11-17 NOTE — Progress Notes (Signed)
ANTIBIOTIC CONSULT NOTE-Preliminary  Pharmacy Consult for Vancomycin and Aztreonam Indication: Fever (unknown source)  Allergies  Allergen Reactions  . Penicillins Rash    Has patient had a PCN reaction causing immediate rash, facial/tongue/throat swelling, SOB or lightheadedness with hypotension: Yes Has patient had a PCN reaction causing severe rash involving mucus membranes or skin necrosis: No Has patient had a PCN reaction that required hospitalization: No Has patient had a PCN reaction occurring within the last 10 years: No If all of the above answers are "NO", then may proceed with Cephalosporin use.     Patient Measurements: Height: 5\' 7"  (170.2 cm) Weight: 143 lb 4.8 oz (65 kg) IBW/kg (Calculated) : 66.1  Vital Signs: BP: 111/75 (12/29 0200) Pulse Rate: 95 (12/29 0200)  Labs: Recent Labs    11/16/17 1039  WBC 15.0*  HGB 12.5*  PLT 237  CREATININE 0.82    Estimated Creatinine Clearance: 61.7 mL/min (by C-G formula based on SCr of 0.82 mg/dL).  No results for input(s): VANCOTROUGH, VANCOPEAK, VANCORANDOM, GENTTROUGH, GENTPEAK, GENTRANDOM, TOBRATROUGH, TOBRAPEAK, TOBRARND, AMIKACINPEAK, AMIKACINTROU, AMIKACIN in the last 72 hours.   Microbiology: Recent Results (from the past 720 hour(s))  Culture, blood (Routine X 2) w Reflex to ID Panel     Status: None (Preliminary result)   Collection Time: 11/16/17 11:07 AM  Result Value Ref Range Status   Specimen Description   Final    LEFT ANTECUBITAL BOTTLES DRAWN AEROBIC AND ANAEROBIC   Special Requests Blood Culture adequate volume  Final   Culture  Setup Time   Final    GRAM POSITIVE COCCI Gram Stain Report Called to,Read Back By and Verified With: HOWARD,C @ 2376 ON 11/17/17 BY JUW GS DONE @ [facility]    Culture PENDING  Incomplete   Report Status PENDING  Incomplete  Culture, blood (Routine X 2) w Reflex to ID Panel     Status: None (Preliminary result)   Collection Time: 11/16/17 11:08 AM  Result Value Ref Range  Status   Specimen Description BLOOD LEFT ARM BOTTLES DRAWN AEROBIC AND ANAEROBIC  Final   Special Requests Blood Culture adequate volume  Final   Culture  Setup Time   Final    GRAM POSITIVE COCCI Gram Stain Report Called to,Read Back By and Verified With: HOWARD,C @  2831 ON 11/17/17 BY JUW GS DONE @ [facility]    Culture PENDING  Incomplete   Report Status PENDING  Incomplete    Medical History: Past Medical History:  Diagnosis Date  . Anemia   . CAD (coronary artery disease)    a. 03/2014 - s/p DES to RCA, nonobstructive disease in LAD/Cx, EF 60%.  . Cataract, congenital    "left eye"  . COPD (chronic obstructive pulmonary disease) with emphysema ([place])    a. Chronic resp failure on home O2 (2L).  . H/O hiatal hernia   . HTN (hypertension)   . Hyperlipidemia   . Hypothyroidism   . Malignant neoplasm of bladder, part unspecified    "it was cured"     Medications:  Levofloxacin 750 mg x 1 dose in the ED Vancomycin 1000 mg IV x 1 dose in the ED  Assessment: 83 yo male with hx of COPD seen in the ED for increased SOB x 2 days. WBCs are elevated. Blood cultures are now positive with GPC seen on gram stain. Patient meets SIRS diagnosis criteria. Pharmacy has been consulted for vancomycin and aztreonam dosing.  Goal of Therapy:  Vancomycin troughs 15-20 mcg/ml Eradicate infection  Plan:  Preliminary review of pertinent patient information completed.  Protocol will be initiated with doses of vancomycin 750 mg IV @12  hours after the initial ED dose, and aztreonam 2 Gm IV.  Forestine Na clinical pharmacist will complete review during morning rounds to assess patient and finalize treatment regimen if needed.  Norberto Sorenson, [facility] 11/17/2017,2:27 AM

## 2017-11-17 NOTE — Progress Notes (Signed)
Paged cardiology fellow twice concerning ECHO results- awaiting call back.

## 2017-11-17 NOTE — Progress Notes (Signed)
Pharmacy Antibiotic Note  Willie Williamson is a 83 y.o. male admitted on 11/16/2017 with fever, lab called with positive blood cultures.  Pharmacy has been consulted for Vancomycin and Aztreonam dosing.  Plan:  Vancomycin 750mg  IV q12h Check trough at steady state Aztreonam 1gm IV q8h Monitor labs, renal fxn, progress and c/s Deescalate ABX when improved / appropriate.    Height: 5\' 7"  (170.2 cm) Weight: 144 lb 10 oz (65.6 kg) IBW/kg (Calculated) : 66.1  Temp (24hrs), Avg:99.4 F (37.4 C), Min:98.1 F (36.7 C), Max:100.1 F (37.8 C)  Recent Labs  Lab 11/16/17 1039 11/16/17 1107  WBC 15.0*  --   CREATININE 0.82  --   LATICACIDVEN  --  1.85    Estimated Creatinine Clearance: 62.2 mL/min (by C-G formula based on SCr of 0.82 mg/dL).   Allergies  Allergen Reactions  . Penicillins Rash    Has patient had a PCN reaction causing immediate rash, facial/tongue/throat swelling, SOB or lightheadedness with hypotension: Yes Has patient had a PCN reaction causing severe rash involving mucus membranes or skin necrosis: No Has patient had a PCN reaction that required hospitalization: No Has patient had a PCN reaction occurring within the last 10 years: No If all of the above answers are "NO", then may proceed with Cephalosporin use.    Antimicrobials this admission: Vancomycin 12/28 >>  Aztreonam 12/28 >>  Levaquin 750mg  x 1 on 12/28  Dose adjustments this admission:  Microbiology results: 12/28 BCx: MRSA 12/28 UCx:   12/28 MRSA PCR:   Thank you for allowing pharmacy to be a part of this patient's care.  [name] A 11/17/2017 8:20 AM

## 2017-11-18 MED ORDER — CHLORHEXIDINE GLUCONATE CLOTH 2 % EX PADS
6.0000 | MEDICATED_PAD | Freq: Every day | CUTANEOUS | Status: AC
  Administered 2017-11-18 – 2017-11-22 (×5): 6 via TOPICAL

## 2017-11-18 MED ORDER — MUPIROCIN 2 % EX OINT
1.0000 "application " | TOPICAL_OINTMENT | Freq: Two times a day (BID) | CUTANEOUS | Status: DC
  Administered 2017-11-18 – 2017-11-22 (×9): 1 via NASAL
  Filled 2017-11-18 (×2): qty 22

## 2017-11-18 NOTE — Progress Notes (Signed)
      INFECTIOUS DISEASE ATTENDING ADDENDUM:   Date: 11/18/2017  Patient name: Willie Williamson  Medical record number: [id number]  Date of birth: 03/08/1931          [facility] Health Antimicrobial Management Team Staphylococcus aureus bacteremia   Staphylococcus aureus bacteremia (SAB) is associated with a high rate of complications and mortality.  Specific aspects of clinical management are critical to optimizing the outcome of patients with SAB.  Therefore, the [facility] Health Antimicrobial Management Team [facility]) has initiated an intervention aimed at improving the management of SAB at [facility].  To do so, Infectious Diseases physicians are providing an evidence-based consult for the management of all patients with SAB.     Yes No Comments  Perform follow-up blood cultures (even if the patient is afebrile) to ensure clearance of bacteremia [x]  []  Ordered today if still positive repeat again tomorrow  Remove vascular catheter and obtain follow-up blood cultures after the removal of the catheter [x]  []  DO NOT PLACE PICC LINE UNTIL CERTAIN BACTEREMIA CLEARING 72-96 hrs   Perform echocardiography to evaluate for endocarditis (transthoracic ECHO is 40-50% sensitive, TEE is > 90% sensitive) []  []  Please keep in mind, that neither test can definitively EXCLUDE endocarditis, and that should clinical suspicion remain high for endocarditis the patient should then still be treated with an "endocarditis" duration of therapy = 6 weeks  There is a lesion suspicious for vegetation  Consult electrophysiologist to evaluate implanted cardiac device (pacemaker, ICD) []  []    Ensure source control []  []  Have all abscesses been drained effectively? Have deep seeded infections (septic joints or osteomyelitis) had appropriate surgical debridement?  Investigate for "metastatic" sites of infection [x]  []  Does the patient have ANY symptom or physical exam finding that would suggest a deeper infection (back or neck pain  that may be suggestive of vertebral osteomyelitis or epidural abscess, muscle pain that could be a symptom of pyomyositis)?  Keep in mind that for deep seeded infections MRI imaging with contrast is preferred rather than other often insensitive tests such as plain x-rays, especially early in a patient's presentation.  MRI L spine is ordered for tomorrow  Change antibiotic therapy to vancomycin []  []  Beta-lactam antibiotics are preferred for MSSA due to higher cure rates.   If on Vancomycin, goal trough should be 15 - 20 mcg/mL  Estimated duration of IV antibiotic therapy:  6 -8 weeks of therapy []  []  Consult case management for probably prolonged outpatient IV antibiotic therapy   Case discussed with Dr. Adair Patter from [facility].   I think we do not need to pursue TEE since we can simply treat him for 6 weeks of IV abx.  Furthermore he may have another indication for long term abx in potential diskitis.  Alcide Evener 11/18/2017, 3:18 PM

## 2017-11-18 NOTE — Progress Notes (Signed)
PROGRESS NOTE    Willie Williamson  JXB:[id number] DOB: 01/28/1931 DOA: 11/16/2017 PCP: [name], MD   Brief Narrative:  Willie Williamson is a 83 y.o. male with medical history significant for emphysema chronic respiratory failure on chronic home O2 2-3 L, CAD, HTN, bladder cancer, BPH,.  Patient presented to the ED with complaints of back pain present over the past year both significantly worsened about 2 days ago. Pt denies falls, no lower extremity weakness.  Patient also endorses worsening difficulty breathing over the past 2 days.  Chronic nonproductive cough unchanged.  No fever no chills at home. Patient also complains of left arm pain, that started here in the ED. No chest pains. Patient son thinks patient was placed on prednisone about a week ago for COPD, and does not think patient is on chronic steroids.  ED Course: Blood pressure initially 133/83, dropped to 79/54, while on Cardizem drip.  Patient also tachycardic on admission pulse of 157.  Temperature max recorded in the ED 100.1.  Patient's O2 increased to 3 L [place], O2 sats greater than 87%.  Chest x-ray negative for acute abnormality, chest CT he negative for acute abnormality but showed multiple compression deformities which appear chronic.  EKG showed what appeared to be atrial flutter. Patient was given Cardizem 10 mg and subsequently placed on Cardizem drip.  Blood pressure dropped.  Patient was given 1 L normal saline bolus.  Patient's tachycardia improved.  Patient was also given 125 mg of Solu-Medrol, placed on antibiotics- vancomycin and Levaquin for fever , with ?focus of infection..   Blood cultures came back positive for MRSA on 11/17/2017.  Infectious disease was reflexively consulted.  Cardiology was consulted for TEE.  Assessment & Plan:   Principal Problem:   COPD with emphysema ([place]) Active Problems:   Essential hypertension   Chronic respiratory failure with hypoxia (HCC)   Tachycardia   MRSA  bacteremia  Sepsis 2/2 MRSA bacteremia -No acute source identified -Infectious disease reflex consultation -Patient will likely benefit from a TEE -Cardiology consult placed for TEE -MRI of the spine ordered -Continue vancomycin - Repeat blood cultures tomorrow -Will likely need PICC line placement and long-term IV antibiotics after second blood cultures negative - Echocardiogram showing There is an ECHO dense structure that is mildly   mobile that intermittently appears in the LVOT under the aortic valve but not attached to the valve. Suggest TEE to further define. The cavity size was normal. There was mild concentric hypertrophy. Systolic function was normal. The estimated ejection   fraction was in the range of 55% to 60%. Wall motion was normal; there were no regional wall motion abnormalities -Discussed on the phone with cardiologist Dr. Acie Fredrickson on-call at [place] having cardiology see and evaluate patient on 11/19/2017 at [facility] and if necessary transferring patient for TEE down to [facility]. -MRI of the lumbar spine has been ordered -Repeat blood cultures ordered for today  Tachycardia -Likely due to sepsis -Was placed on IV Cardizem but became hypotensive in the emergency department -Cardizem discontinued  Elevated troponin with hx of CAD- 0.04.  -Troponins plateaued  Back pain -MRI of the lumbar spine ordered     DVT prophylaxis: Lovenox Code Status: DNR Family Communication: No family is bedside Disposition Plan: Pending further workup   Consultants:   Infectious disease  Cardiology  Procedures:   Echocardiogram  Antimicrobials:   Vancomycin and Levaquin   Subjective: Patient seen this morning.  He voices his pain is somewhat better after having catheter placed.  He still endorses some lower back pain.  Objective: Vitals:   11/18/17 0200 11/18/17 0300 11/18/17 0400 11/18/17 0500  BP: (!) 140/93 (!)  143/75 (!) 147/90   Pulse: 95 94 99   Resp: 16 (!) 23 16   Temp:   98.1 F (36.7 C)   TempSrc:   Oral   SpO2: 96% 96% 97%   Weight:    66.1 kg (145 lb 11.6 oz)  Height:        Intake/Output Summary (Last 24 hours) at 11/18/2017 0752 Last data filed at 11/18/2017 0618 Gross per 24 hour  Intake 1395 ml  Output 2000 ml  Net -605 ml   Filed Weights   11/16/17 2319 11/17/17 0400 11/18/17 0500  Weight: 65 kg (143 lb 4.8 oz) 65.6 kg (144 lb 10 oz) 66.1 kg (145 lb 11.6 oz)    Examination:  Physical Exam  Constitutional: He is oriented to person, place, and time and well-developed, well-nourished, and in no distress.  Neck: Normal range of motion. Neck supple.  Cardiovascular: Normal rate, regular rhythm and normal heart sounds.  Pulmonary/Chest: Effort normal and breath sounds normal.  Abdominal: Soft. Bowel sounds are normal.  Musculoskeletal: Normal range of motion.  Neurological: He is alert and oriented to person, place, and time.  Psychiatric: Mood and affect normal.       Data Reviewed: I have personally reviewed following labs and imaging studies  CBC: Recent Labs  Lab 11/16/17 1039  WBC 15.0*  HGB 12.5*  HCT 39.1  MCV 96.3  PLT 176   Basic Metabolic Panel: Recent Labs  Lab 11/16/17 1039  NA 135  K 4.2  CL 98*  CO2 26  GLUCOSE 106*  BUN 19  CREATININE 0.82  CALCIUM 8.9   GFR: Estimated Creatinine Clearance: 62.7 mL/min (by C-G formula based on SCr of 0.82 mg/dL). Liver Function Tests: Recent Labs  Lab 11/16/17 1058  AST 32  ALT 40  ALKPHOS 110  BILITOT 1.0  PROT 7.1  ALBUMIN 3.7   No results for input(s): LIPASE, AMYLASE in the last 168 hours. No results for input(s): AMMONIA in the last 168 hours. Coagulation Profile: No results for input(s): INR, PROTIME in the last 168 hours. Cardiac Enzymes: Recent Labs  Lab 11/16/17 1039 11/16/17 1740 11/17/17 0026  TROPONINI <0.03 0.04* 0.04*   BNP (last 3 results) No results for input(s):  PROBNP in the last 8760 hours. HbA1C: No results for input(s): HGBA1C in the last 72 hours. CBG: No results for input(s): GLUCAP in the last 168 hours. Lipid Profile: No results for input(s): CHOL, HDL, LDLCALC, TRIG, CHOLHDL, LDLDIRECT in the last 72 hours. Thyroid Function Tests: Recent Labs    11/16/17 1058  TSH 1.778  FREET4 1.42*   Anemia Panel: No results for input(s): VITAMINB12, FOLATE, FERRITIN, TIBC, IRON, RETICCTPCT in the last 72 hours. Sepsis Labs: Recent Labs  Lab 11/16/17 1107  LATICACIDVEN 1.85    Recent Results (from the past 240 hour(s))  Culture, blood (Routine X 2) w Reflex to ID Panel     Status: None (Preliminary result)   Collection Time: 11/16/17 11:07 AM  Result Value Ref Range Status   Specimen Description   Final    LEFT ANTECUBITAL BOTTLES DRAWN AEROBIC AND ANAEROBIC   Special Requests Blood Culture adequate volume  Final   Culture  Setup Time   Final    GRAM POSITIVE COCCI Gram Stain Report  Called to,Read Back By and Verified With: HOWARD,C @ 0215 ON 11/17/17 BY JUW GS DONE @ [facility] ANAEROBIC BOTTLE ONLY CRITICAL RESULT CALLED TO, READ BACK BY AND VERIFIED WITH: S HALL,PHARMD AT 0737 11/17/17 BY L BENFIELD Performed at [facility], [place] [address], [place], [place] [address]    Culture GRAM POSITIVE COCCI  Final   Report Status PENDING  Incomplete  Blood Culture ID Panel (Reflexed)     Status: Abnormal   Collection Time: 11/16/17 11:07 AM  Result Value Ref Range Status   Enterococcus species NOT DETECTED NOT DETECTED Final   Listeria monocytogenes NOT DETECTED NOT DETECTED Final   Staphylococcus species DETECTED (A) NOT DETECTED Final    Comment: CRITICAL RESULT CALLED TO, READ BACK BY AND VERIFIED WITH: S HALL,PHARMD AT 9485 11/17/17 BY L BENFIELD    Staphylococcus aureus DETECTED (A) NOT DETECTED Final    Comment: Methicillin (oxacillin)-resistant Staphylococcus aureus (MRSA). MRSA is predictably resistant to beta-lactam antibiotics  (except ceftaroline). Preferred therapy is vancomycin unless clinically contraindicated. Patient requires contact precautions if  hospitalized. CRITICAL RESULT CALLED TO, READ BACK BY AND VERIFIED WITH: S HALL,PHARMD AT 0756 11/17/17 BY L BENFIELD    Methicillin resistance DETECTED (A) NOT DETECTED Final    Comment: CRITICAL RESULT CALLED TO, READ BACK BY AND VERIFIED WITH: S HALL,PHARMD AT 0756 11/17/17 BY L BENFIELD    Streptococcus species NOT DETECTED NOT DETECTED Final   Streptococcus agalactiae NOT DETECTED NOT DETECTED Final   Streptococcus pneumoniae NOT DETECTED NOT DETECTED Final   Streptococcus pyogenes NOT DETECTED NOT DETECTED Final   Acinetobacter baumannii NOT DETECTED NOT DETECTED Final   Enterobacteriaceae species NOT DETECTED NOT DETECTED Final   Enterobacter cloacae complex NOT DETECTED NOT DETECTED Final   Escherichia coli NOT DETECTED NOT DETECTED Final   Klebsiella oxytoca NOT DETECTED NOT DETECTED Final   Klebsiella pneumoniae NOT DETECTED NOT DETECTED Final   Proteus species NOT DETECTED NOT DETECTED Final   Serratia marcescens NOT DETECTED NOT DETECTED Final   Haemophilus influenzae NOT DETECTED NOT DETECTED Final   Neisseria meningitidis NOT DETECTED NOT DETECTED Final   Pseudomonas aeruginosa NOT DETECTED NOT DETECTED Final   Candida albicans NOT DETECTED NOT DETECTED Final   Candida glabrata NOT DETECTED NOT DETECTED Final   Candida krusei NOT DETECTED NOT DETECTED Final   Candida parapsilosis NOT DETECTED NOT DETECTED Final   Candida tropicalis NOT DETECTED NOT DETECTED Final    Comment: Performed at [facility], [place]. [address] [place], [place] [address]  Culture, blood (Routine X 2) w Reflex to ID Panel     Status: None (Preliminary result)   Collection Time: 11/16/17 11:08 AM  Result Value Ref Range Status   Specimen Description BLOOD LEFT ARM BOTTLES DRAWN AEROBIC AND ANAEROBIC  Final   Special Requests Blood Culture adequate volume  Final    Culture  Setup Time   Final    GRAM POSITIVE COCCI Gram Stain Report Called to,Read Back By and Verified With: HOWARD,C @  3500 ON 11/17/17 BY JUW GS DONE @ [facility] ANAEROBIC BOTTLE ONLY    Culture GRAM POSITIVE COCCI  Final   Report Status PENDING  Incomplete  MRSA PCR Screening     Status: Abnormal   Collection Time: 11/16/17 11:05 PM  Result Value Ref Range Status   MRSA by PCR POSITIVE (A) NEGATIVE Final    Comment:        The GeneXpert MRSA Assay (FDA approved for NASAL specimens only),  is one component of a comprehensive MRSA colonization surveillance program. It is not intended to diagnose MRSA infection nor to guide or monitor treatment for MRSA infections. RESULT CALLED TO, READ BACK BY AND VERIFIED WITH: JEFFERSON V. @ 7902 ON [id number] BY HENDERSON L.          Radiology Studies: Dg Chest 2 View  Result Date: 11/16/2017 CLINICAL DATA:  Tachycardia, chest pain, shortness of breath EXAM: CHEST  2 VIEW COMPARISON:  04/20/2017 FINDINGS: Lungs are essentially clear. Bibasilar scarring. No focal consolidation. No pleural effusion or pneumothorax. The heart is normal in size. Moderate compression fracture deformity of a mid and lower vertebral body, possibly T5 and T11. Mild anterior wedging of an additional mid thoracic vertebral body, possibly T4. These findings are age indeterminate but new from the prior. IMPRESSION: No evidence of acute cardiopulmonary disease. Mild to moderate compression fracture deformities of three thoracic vertebral bodies, as described above. These findings are age indeterminate but new from 04/20/2017. Electronically Signed   By: Julian Hy M.D.   On: 11/16/2017 11:40   Ct Angio Chest/abd/pel For Dissection W And/or Wo Contrast  Result Date: 11/16/2017 CLINICAL DATA:  Chest and low back pain, initial encounter EXAM: CT ANGIOGRAPHY CHEST, ABDOMEN AND PELVIS TECHNIQUE: Multidetector CT imaging through the chest, abdomen and pelvis was performed  using the standard protocol during bolus administration of intravenous contrast. Multiplanar reconstructed images and MIPs were obtained and reviewed to evaluate the vascular anatomy. CONTRAST:  12mL ISOVUE-370 IOPAMIDOL (ISOVUE-370) INJECTION 76% COMPARISON:  None. FINDINGS: CTA CHEST FINDINGS Cardiovascular: The thoracic aorta and its branches demonstrate mild atherosclerotic changes. No aneurysmal dilatation or dissection is identified. Coronary calcifications are seen. No cardiac enlargement is seen. The pulmonary artery as visualized shows no large central embolus. Mediastinum/Nodes: Thoracic inlet is within normal limits. No hilar or mediastinal adenopathy is identified. The esophagus is within normal limits. Lungs/Pleura: Diffuse emphysematous changes are identified. Mild atelectatic changes are noted in the right lower lobe and lingula. No focal infiltrate or sizable parenchymal nodule is noted. No pleural effusion is seen. Musculoskeletal: There compression deformities of T5-T6 and T12 identified. These are new from June of 2018 but unchanged from the recent chest x-ray. They have a chronic appearance given significant sclerosis. There is also a fracture of the T5 spinous process identified. Review of the MIP images confirms the above findings. CTA ABDOMEN AND PELVIS FINDINGS VASCULAR Aorta: Diffuse atherosclerotic changes of the aorta are noted without aneurysmal dilatation. Celiac: Mild narrowing is noted proximally with poststenotic dilatation. Atherosclerotic changes are identified. SMA: Mild atherosclerotic changes are noted. No focal stenosis is seen. Renals: Proximal atherosclerotic changes are noted without focal stenosis. IMA: Patent without evidence of aneurysm, dissection, vasculitis or significant stenosis. Iliacs: Diffuse atherosclerotic changes are noted without aneurysmal dilatation or focal stenosis. Veins: No obvious venous abnormality within the limitations of this arterial phase study.  Review of the MIP images confirms the above findings. NON-VASCULAR Hepatobiliary: Diffuse decreased attenuation is noted consistent with fatty infiltration. Gallbladder is well distended without cholelithiasis. Pancreas: Unremarkable. No pancreatic ductal dilatation or surrounding inflammatory changes. Spleen: Normal in size without focal abnormality. Adrenals/Urinary Tract: Adrenal glands are within normal limits. Nonobstructing 2 mm stone is noted in the lower pole of the right kidney. Bladder is well distended. Stomach/Bowel: The appendix is within normal limits without inflammatory change. No obstructive or inflammatory changes in the large and small bowel are noted. Lymphatic: No significant lymphadenopathy is noted. Reproductive: Prostatic calcifications are seen. Other: No  abdominal wall hernia or abnormality. No abdominopelvic ascites. Musculoskeletal: Degenerative changes of lumbar spine are noted. No acute compression deformity is seen. Review of the MIP images confirms the above findings. IMPRESSION: No evidence of acute aortic abnormality. Diffuse atherosclerotic changes are noted. Diffuse emphysematous changes. Multiple compression deformities which appear chronic in nature as described. A chronic appearing T5 spinous process fracture is noted as well. Nonobstructing right renal stone. Chronic changes as described above. Electronically Signed   By: Inez Catalina M.D.   On: 11/16/2017 15:19        Scheduled Meds: . aspirin EC  81 mg Oral Daily  . carvedilol  3.125 mg Oral BID WC  . Chlorhexidine Gluconate Cloth  6 each Topical Q0600  . heparin  5,000 Units Subcutaneous Q8H  . ipratropium  0.5 mg Nebulization Q6H  . levalbuterol  0.63 mg Nebulization Q6H  . levothyroxine  88 mcg Oral QAC breakfast  . methylPREDNISolone (SOLU-MEDROL) injection  40 mg Intravenous Q12H  . mometasone-formoterol  2 puff Inhalation BID  . mupirocin ointment  1 application Nasal BID  . pravastatin  10 mg Oral  q1800  . sodium chloride flush  3 mL Intravenous Q12H   Continuous Infusions: . aztreonam Stopped (11/18/17 0420)  . vancomycin 750 mg (11/18/17 0618)     LOS: 2 days    Time spent: 78minutes    Loretha Stapler, MD [facility] Pager [phone]  If 7PM-7AM, please contact night-coverage [email or web address] Password [email or web address] 11/18/2017, 7:52 AM

## 2017-11-18 NOTE — Plan of Care (Signed)
Progressing careplans

## 2017-11-19 ENCOUNTER — Inpatient Hospital Stay (HOSPITAL_COMMUNITY): Payer: Medicare HMO

## 2017-11-19 LAB — VANCOMYCIN, TROUGH: VANCOMYCIN TR: 10 ug/mL — AB (ref 15–20)

## 2017-11-19 LAB — BASIC METABOLIC PANEL
Anion gap: 10 (ref 5–15)
BUN: 24 mg/dL — AB (ref 6–20)
CALCIUM: 8.6 mg/dL — AB (ref 8.9–10.3)
CO2: 25 mmol/L (ref 22–32)
Chloride: 98 mmol/L — ABNORMAL LOW (ref 101–111)
Creatinine, Ser: 0.81 mg/dL (ref 0.61–1.24)
GFR calc Af Amer: >60 mL/min (ref >60)
GLUCOSE: 156 mg/dL — AB (ref 65–99)
POTASSIUM: 4 mmol/L (ref 3.5–5.1)
Sodium: 133 mmol/L — ABNORMAL LOW (ref 135–145)

## 2017-11-19 LAB — URINE CULTURE: CULTURE: NO GROWTH

## 2017-11-19 MED ORDER — HYDRALAZINE HCL 20 MG/ML IJ SOLN: 2.0000 mg | Freq: Four times a day (QID) | INTRAMUSCULAR | Status: DC | PRN

## 2017-11-19 MED ORDER — ORAL CARE MOUTH RINSE
15.0000 mL | Freq: Two times a day (BID) | OROMUCOSAL | Status: DC
  Administered 2017-11-19 – 2017-11-21 (×5): 15 mL via OROMUCOSAL

## 2017-11-19 MED ORDER — VANCOMYCIN HCL IN DEXTROSE 1-5 GM/200ML-% IV SOLN
1000.0000 mg | Freq: Two times a day (BID) | INTRAVENOUS | Status: DC
  Administered 2017-11-19 – 2017-11-22 (×7): 1000 mg via INTRAVENOUS
  Filled 2017-11-19 (×7): qty 200

## 2017-11-19 NOTE — Progress Notes (Signed)
      INFECTIOUS DISEASE ATTENDING ADDENDUM:   Date: 11/19/2017  Patient name: Willie Williamson  Medical record number: [id number]  Date of birth: February 13, 1931          [facility] Health Antimicrobial Management Team Staphylococcus aureus bacteremia   Staphylococcus aureus bacteremia (SAB) is associated with a high rate of complications and mortality.  Specific aspects of clinical management are critical to optimizing the outcome of patients with SAB.  Therefore, the [facility] Health Antimicrobial Management Team [facility]) has initiated an intervention aimed at improving the management of SAB at [facility].  To do so, Infectious Diseases physicians are providing an evidence-based consult for the management of all patients with SAB.     Yes No Comments  Perform follow-up blood cultures (even if the patient is afebrile) to ensure clearance of bacteremia [x]  []  BLOOD CULTURES PERSISTENTLY POSITIVE FROM YESTERDAY SO ORDERED A FRESH SET TODAY  Remove vascular catheter and obtain follow-up blood cultures after the removal of the catheter [x]  []  DO NOT PLACE PICC LINE UNTIL CERTAIN BACTEREMIA CLEARING 72-96 hrs from today  Perform echocardiography to evaluate for endocarditis (transthoracic ECHO is 40-50% sensitive, TEE is > 90% sensitive) []  []  Please keep in mind, that neither test can definitively EXCLUDE endocarditis, and that should clinical suspicion remain high for endocarditis the patient should then still be treated with an "endocarditis" duration of therapy = 6 weeks  There is a lesion suspicious for vegetation  Consult electrophysiologist to evaluate implanted cardiac device (pacemaker, ICD) []  []    Ensure source control []  []  Have all abscesses been drained effectively? Have deep seeded infections (septic joints or osteomyelitis) had appropriate surgical debridement?  Investigate for "metastatic" sites of infection [x]  []  Does the patient have ANY symptom or physical exam finding that would suggest  a deeper infection (back or neck pain that may be suggestive of vertebral osteomyelitis or epidural abscess, muscle pain that could be a symptom of pyomyositis)?  Keep in mind that for deep seeded infections MRI imaging with contrast is preferred rather than other often insensitive tests such as plain x-rays, especially early in a patient's presentation.  MRI L spine shows fractures but no diskitis mentioned  Change antibiotic therapy to vancomycin []  []  Beta-lactam antibiotics are preferred for MSSA due to higher cure rates.   If on Vancomycin, goal trough should be 15 - 20 mcg/mL  Estimated duration of IV antibiotic therapy:  6 -8 weeks of therapy []  []  Consult case management for probably prolonged outpatient IV antibiotic therapy    I think we do not need to pursue TEE since we can simply treat him for 6 weeks of IV abx.  I will check in with Dr. Adair Patter tomorrow via phone.  My pager is [phone] 11/19/2017, 5:22 PM

## 2017-11-19 NOTE — Care Management Note (Signed)
Case Management Note  Patient Details  Name: Willie Williamson MRN: [id number] Date of Birth: April 20, 1931  Subjective/Objective:                  Adm with COPD and bacteremia. Patient is from home. Lives alone, but reports grandson stays with him frequently and that son and DIL visit daily. Has cane and RW if needed. Has continuous oxygen provided by [facility]. Patient will need to DC home with IV antibiotics. He would like [facility]. Reports his daughter in law will administer antibiotics.   Action/Plan: DC home with home health RN and IV antibiotics. CM following. Vaughan Basta of [facility] aware of home health needs.   Expected Discharge Date:       11/21/2016 Expected Discharge Plan:  [place]  In-House Referral:     Discharge planning Services  CM Consult  Post Acute Care Choice:  Home Health Choice offered to:  Patient  DME Arranged:    DME Agency:     HH Arranged:  RN, IV Antibiotics HH Agency:  [facility]  Status of Service:  In process, will continue to follow  If discussed at Long Length of Stay Meetings, dates discussed:    Additional Comments:  Maureena Dabbs, Chauncey [place], RN 11/19/2017, 1:22 PM

## 2017-11-19 NOTE — Progress Notes (Signed)
Pharmacy Antibiotic Note  Willie Williamson is a 83 y.o. male admitted on 11/16/2017 with fever, lab called with positive blood cultures.  Pharmacy has been consulted for Vancomycin and Aztreonam dosing.  Trough below goal  Plan:  Increase Vancomycin to 1000mg  IV q12h Repeat trough at steady state Aztreonam 1gm IV q8h Monitor labs, renal fxn, progress and c/s Deescalate ABX when improved / appropriate.    Height: 5\' 7"  (170.2 cm) Weight: 145 lb 4.5 oz (65.9 kg) IBW/kg (Calculated) : 66.1  Temp (24hrs), Avg:98.3 F (36.8 C), Min:97.6 F (36.4 C), Max:98.8 F (37.1 C)  Recent Labs  Lab 11/16/17 1039 11/16/17 1107 11/19/17 0458  WBC 15.0*  --   --   CREATININE 0.82  --  0.81  LATICACIDVEN  --  1.85  --   VANCOTROUGH  --   --  10*    Estimated Creatinine Clearance: 63.3 mL/min (by C-G formula based on SCr of 0.81 mg/dL).   Allergies  Allergen Reactions  . Penicillins Rash    Has patient had a PCN reaction causing immediate rash, facial/tongue/throat swelling, SOB or lightheadedness with hypotension: Yes Has patient had a PCN reaction causing severe rash involving mucus membranes or skin necrosis: No Has patient had a PCN reaction that required hospitalization: No Has patient had a PCN reaction occurring within the last 10 years: No If all of the above answers are "NO", then may proceed with Cephalosporin use.    Antimicrobials this admission: Vancomycin 12/28 >>  Aztreonam 12/28 >>  Levaquin 750mg  x 1 on 12/28  Dose adjustments this admission:  Microbiology results: 12/30 Repeat BCx: Pending 12/28 BCx: MRSA 12/28 UCx:  (-) final 12/28 MRSA PCR: (+)  Thank you for allowing pharmacy to be a part of this patient's care.  Pricilla Larsson 11/19/2017 10:16 AM

## 2017-11-19 NOTE — Progress Notes (Signed)
PROGRESS NOTE    Willie Williamson  XBM:[id number] DOB: [birth year] DOA: 11/16/2017 PCP: [name], MD   Brief Narrative:  Willie Williamson is a 83 y.o. male with medical history significant for emphysema chronic respiratory failure on chronic home O2 2-3 L, CAD, HTN, bladder cancer, BPH,.  Patient presented to the ED with complaints of back pain present over the past year both significantly worsened about 2 days ago. Pt denies falls, no lower extremity weakness.  Patient also endorses worsening difficulty breathing over the past 2 days.  Chronic nonproductive cough unchanged.  No fever no chills at home. Patient also complains of left arm pain, that started here in the ED. No chest pains. Patient son thinks patient was placed on prednisone about a week ago for COPD, and does not think patient is on chronic steroids.  ED Course: Blood pressure initially 133/83, dropped to 79/54, while on Cardizem drip.  Patient also tachycardic on admission pulse of 157.  Temperature max recorded in the ED 100.1.  Patient's O2 increased to 3 L [place], O2 sats greater than 87%.  Chest x-ray negative for acute abnormality, chest CT he negative for acute abnormality but showed multiple compression deformities which appear chronic.  EKG showed what appeared to be atrial flutter. Patient was given Cardizem 10 mg and subsequently placed on Cardizem drip.  Blood pressure dropped.  Patient was given 1 L normal saline bolus.  Patient's tachycardia improved.  Patient was also given 125 mg of Solu-Medrol, placed on antibiotics- vancomycin and Levaquin for fever , with ?focus of infection..   Blood cultures came back positive for MRSA on 11/17/2017.  Infectious disease was reflexively consulted.  .  Assessment & Plan:   Principal Problem:   COPD with emphysema ([place]) Active Problems:   Essential hypertension   Chronic respiratory failure with hypoxia (H[facility])   Tachycardia   MRSA bacteremia  Sepsis 2/2 MRSA bacteremia -No  acute source identified -Infectious disease reflex consultation -discussed with ID- TEE not to add additional change in treatment therefore will discontinue consultation and treat patient with 6 weeks of IV abx -Continue vancomycin - repeat blood cultures showing NG <24 hours -Will likely need PICC line placement and long-term IV antibiotics after second blood cultures negative - Echocardiogram showing There is an ECHO dense structure that is mildly   mobile that intermittently appears in the LVOT under the aortic valve but not attached to the valve. Suggest TEE to further define. The cavity size was normal. There was mild concentric hypertrophy. Systolic function was normal. The estimated ejection   fraction was in the range of 55% to 60%. Wall motion was normal; there were no regional wall motion abnormalities - f/u lumbar spine today   Tachycardia -improved with treatment of infx - continue telemetry  Elevated troponin with hx of CAD- 0.04.  -Troponins plateaued  Back pain -MRI of the lumbar spine today     DVT prophylaxis: Lovenox Code Status: DNR Family Communication: No family is bedside- called patients son and gave update Disposition Plan: pending repeat blood cultures - if negative can consider d/c in the next 24-48 hours   Consultants:   Infectious disease  Cardiology  Procedures:   Echocardiogram  Antimicrobials:   Vancomycin  levaquin   Subjective: Patient seen after breakfast.  He reports no pain today and feels well.  Agrees that he would not want to pursue any invasive testing or a valve replacement  Objective: Vitals:   11/19/17 0747 11/19/17 0800  11/19/17 0847 11/19/17 0900  BP:  (!) 143/82  (!) 161/88  Pulse: 74 79  83  Resp: (!) 21 20  19   Temp: 98.3 F (36.8 C)     TempSrc: Oral     SpO2: 94% 95% 96% 96%  Weight:      Height:        Intake/Output Summary (Last 24 hours) at 11/19/2017 0957 Last data filed at 11/19/2017 0850 Gross  per 24 hour  Intake 900 ml  Output 1550 ml  Net -650 ml   Filed Weights   11/17/17 0400 11/18/17 0500 11/19/17 0500  Weight: 65.6 kg (144 lb 10 oz) 66.1 kg (145 lb 11.6 oz) 65.9 kg (145 lb 4.5 oz)    Examination:  Physical Exam  Constitutional: He is oriented to person, place, and time and well-developed, well-nourished, and in no distress.  Neck: Normal range of motion. Neck supple.  Cardiovascular: Normal rate, regular rhythm and normal heart sounds.  Pulmonary/Chest: Effort normal. No respiratory distress. He has wheezes. He exhibits no tenderness.  Abdominal: Soft. Bowel sounds are normal. He exhibits no distension. There is no tenderness.  Musculoskeletal: Normal range of motion.  Neurological: He is alert and oriented to person, place, and time.  Skin: Skin is warm and dry.  Psychiatric: Mood and affect normal.       Data Reviewed: I have personally reviewed following labs and imaging studies  CBC: Recent Labs  Lab 11/16/17 1039  WBC 15.0*  HGB 12.5*  HCT 39.1  MCV 96.3  PLT 671   Basic Metabolic Panel: Recent Labs  Lab 11/16/17 1039 11/19/17 0458  NA 135 133*  K 4.2 4.0  CL 98* 98*  CO2 26 25  GLUCOSE 106* 156*  BUN 19 24*  CREATININE 0.82 0.81  CALCIUM 8.9 8.6*   GFR: Estimated Creatinine Clearance: 63.3 mL/min (by C-G formula based on SCr of 0.81 mg/dL). Liver Function Tests: Recent Labs  Lab 11/16/17 1058  AST 32  ALT 40  ALKPHOS 110  BILITOT 1.0  PROT 7.1  ALBUMIN 3.7   No results for input(s): LIPASE, AMYLASE in the last 168 hours. No results for input(s): AMMONIA in the last 168 hours. Coagulation Profile: No results for input(s): INR, PROTIME in the last 168 hours. Cardiac Enzymes: Recent Labs  Lab 11/16/17 1039 11/16/17 1740 11/17/17 0026  TROPONINI <0.03 0.04* 0.04*   BNP (last 3 results) No results for input(s): PROBNP in the last 8760 hours. HbA1C: No results for input(s): HGBA1C in the last 72 hours. CBG: No results  for input(s): GLUCAP in the last 168 hours. Lipid Profile: No results for input(s): CHOL, HDL, LDLCALC, TRIG, CHOLHDL, LDLDIRECT in the last 72 hours. Thyroid Function Tests: Recent Labs    11/16/17 1058  TSH 1.778  FREET4 1.42*   Anemia Panel: No results for input(s): VITAMINB12, FOLATE, FERRITIN, TIBC, IRON, RETICCTPCT in the last 72 hours. Sepsis Labs: Recent Labs  Lab 11/16/17 1107  LATICACIDVEN 1.85    Recent Results (from the past 240 hour(s))  Culture, blood (Routine X 2) w Reflex to ID Panel     Status: Abnormal (Preliminary result)   Collection Time: 11/16/17 11:07 AM  Result Value Ref Range Status   Specimen Description   Final    LEFT ANTECUBITAL BOTTLES DRAWN AEROBIC AND ANAEROBIC   Special Requests Blood Culture adequate volume  Final   Culture  Setup Time   Final    GRAM POSITIVE COCCI Gram Stain Report Called to,Read Back  By and Verified With: HOWARD,C @ 0215 ON 11/17/17 BY JUW GS DONE @ [facility] ANAEROBIC BOTTLE ONLY CRITICAL RESULT CALLED TO, READ BACK BY AND VERIFIED WITH: S HALL,PHARMD AT 9563 11/17/17 BY L BENFIELD    Culture (A)  Final    STAPHYLOCOCCUS AUREUS SUSCEPTIBILITIES TO FOLLOW Performed at [facility], [address] [place], [place] [address]    Report Status PENDING  Incomplete  Blood Culture ID Panel (Reflexed)     Status: Abnormal   Collection Time: 11/16/17 11:07 AM  Result Value Ref Range Status   Enterococcus species NOT DETECTED NOT DETECTED Final   Listeria monocytogenes NOT DETECTED NOT DETECTED Final   Staphylococcus species DETECTED (A) NOT DETECTED Final    Comment: CRITICAL RESULT CALLED TO, READ BACK BY AND VERIFIED WITH: S HALL,PHARMD AT 0756 11/17/17 BY L BENFIELD    Staphylococcus aureus DETECTED (A) NOT DETECTED Final    Comment: Methicillin (oxacillin)-resistant Staphylococcus aureus (MRSA). MRSA is predictably resistant to beta-lactam antibiotics (except ceftaroline). Preferred therapy is vancomycin unless  clinically contraindicated. Patient requires contact precautions if  hospitalized. CRITICAL RESULT CALLED TO, READ BACK BY AND VERIFIED WITH: S HALL,PHARMD AT 0756 11/17/17 BY L BENFIELD    Methicillin resistance DETECTED (A) NOT DETECTED Final    Comment: CRITICAL RESULT CALLED TO, READ BACK BY AND VERIFIED WITH: S HALL,PHARMD AT 0756 11/17/17 BY L BENFIELD    Streptococcus species NOT DETECTED NOT DETECTED Final   Streptococcus agalactiae NOT DETECTED NOT DETECTED Final   Streptococcus pneumoniae NOT DETECTED NOT DETECTED Final   Streptococcus pyogenes NOT DETECTED NOT DETECTED Final   Acinetobacter baumannii NOT DETECTED NOT DETECTED Final   Enterobacteriaceae species NOT DETECTED NOT DETECTED Final   Enterobacter cloacae complex NOT DETECTED NOT DETECTED Final   Escherichia coli NOT DETECTED NOT DETECTED Final   Klebsiella oxytoca NOT DETECTED NOT DETECTED Final   Klebsiella pneumoniae NOT DETECTED NOT DETECTED Final   Proteus species NOT DETECTED NOT DETECTED Final   Serratia marcescens NOT DETECTED NOT DETECTED Final   Haemophilus influenzae NOT DETECTED NOT DETECTED Final   Neisseria meningitidis NOT DETECTED NOT DETECTED Final   Pseudomonas aeruginosa NOT DETECTED NOT DETECTED Final   Candida albicans NOT DETECTED NOT DETECTED Final   Candida glabrata NOT DETECTED NOT DETECTED Final   Candida krusei NOT DETECTED NOT DETECTED Final   Candida parapsilosis NOT DETECTED NOT DETECTED Final   Candida tropicalis NOT DETECTED NOT DETECTED Final    Comment: Performed at [facility], [place]. [address], [place], [place] [address]  Culture, blood (Routine X 2) w Reflex to ID Panel     Status: Abnormal (Preliminary result)   Collection Time: 11/16/17 11:08 AM  Result Value Ref Range Status   Specimen Description BLOOD LEFT ARM BOTTLES DRAWN AEROBIC AND ANAEROBIC  Final   Special Requests Blood Culture adequate volume  Final   Culture  Setup Time   Final    GRAM POSITIVE COCCI Gram  Stain Report Called to,Read Back By and Verified With: HOWARD,C @  1884 ON 11/17/17 BY JUW GS DONE @ [facility] ANAEROBIC BOTTLE ONLY    Culture STAPHYLOCOCCUS AUREUS (A)  Final   Report Status PENDING  Incomplete  Culture, Urine     Status: None   Collection Time: 11/16/17  3:11 PM  Result Value Ref Range Status   Specimen Description URINE, CLEAN CATCH  Final   Special Requests NONE  Final   Culture   Final    NO  GROWTH Performed at [facility], [place] [address] [place],  [address]    Report Status 11/19/2017 FINAL  Final  MRSA PCR Screening     Status: Abnormal   Collection Time: 11/16/17 11:05 PM  Result Value Ref Range Status   MRSA by PCR POSITIVE (A) NEGATIVE Final    Comment:        The GeneXpert MRSA Assay (FDA approved for NASAL specimens only), is one component of a comprehensive MRSA colonization surveillance program. It is not intended to diagnose MRSA infection nor to guide or monitor treatment for MRSA infections. RESULT CALLED TO, READ BACK BY AND VERIFIED WITH: JEFFERSON V. @ 9381 ON [id number] BY HENDERSON L.   Culture, blood (Routine X 2) w Reflex to ID Panel     Status: None (Preliminary result)   Collection Time: 11/18/17  2:26 PM  Result Value Ref Range Status   Specimen Description   Final    RIGHT ANTECUBITAL BOTTLES DRAWN AEROBIC AND ANAEROBIC   Special Requests Blood Culture adequate volume  Final   Culture PENDING  Incomplete   Report Status PENDING  Incomplete  Culture, blood (Routine X 2) w Reflex to ID Panel     Status: None (Preliminary result)   Collection Time: 11/18/17  2:26 PM  Result Value Ref Range Status   Specimen Description   Final    BLOOD RIGHT WRIST BOTTLES DRAWN AEROBIC AND ANAEROBIC   Special Requests Blood Culture adequate volume  Final   Culture PENDING  Incomplete   Report Status PENDING  Incomplete         Radiology Studies: No results found.      Scheduled Meds: . aspirin EC  81 mg Oral Daily  .  carvedilol  3.125 mg Oral BID WC  . Chlorhexidine Gluconate Cloth  6 each Topical Q0600  . heparin  5,000 Units Subcutaneous Q8H  . ipratropium  0.5 mg Nebulization Q6H  . levalbuterol  0.63 mg Nebulization Q6H  . levothyroxine  88 mcg Oral QAC breakfast  . mouth rinse  15 mL Mouth Rinse BID  . methylPREDNISolone (SOLU-MEDROL) injection  40 mg Intravenous Q12H  . mometasone-formoterol  2 puff Inhalation BID  . mupirocin ointment  1 application Nasal BID  . pravastatin  10 mg Oral q1800  . sodium chloride flush  3 mL Intravenous Q12H   Continuous Infusions: . aztreonam Stopped (11/19/17 0435)  . vancomycin Stopped (11/19/17 0810)     LOS: 3 days    Time spent: 35 minutes    Loretha Stapler, MD [facility] Pager [phone]  If 7PM-7AM, please contact night-coverage [email or web address] Password [email or web address] 11/19/2017, 9:57 AM

## 2017-11-20 LAB — CULTURE, BLOOD (ROUTINE X 2)
SPECIAL REQUESTS: ADEQUATE
SPECIAL REQUESTS: ADEQUATE
Special Requests: ADEQUATE

## 2017-11-20 MED ORDER — LEVALBUTEROL HCL 0.63 MG/3ML IN NEBU: 0.6300 mg | INHALATION_SOLUTION | RESPIRATORY_TRACT | Status: DC | PRN

## 2017-11-20 MED ORDER — LEVALBUTEROL HCL 0.63 MG/3ML IN NEBU
0.6300 mg | INHALATION_SOLUTION | Freq: Three times a day (TID) | RESPIRATORY_TRACT | Status: DC
  Administered 2017-11-21 – 2017-11-22 (×5): 0.63 mg via RESPIRATORY_TRACT
  Filled 2017-11-20 (×5): qty 3

## 2017-11-20 MED ORDER — IPRATROPIUM BROMIDE 0.02 % IN SOLN
0.5000 mg | Freq: Three times a day (TID) | RESPIRATORY_TRACT | Status: DC
  Administered 2017-11-21 – 2017-11-22 (×5): 0.5 mg via RESPIRATORY_TRACT
  Filled 2017-11-20 (×5): qty 2.5

## 2017-11-20 NOTE — Progress Notes (Signed)
Call from [place], pt's heart rate was up to 150.     Checked on patient who was sitting in bed eating, states, "I was trying to cut up this tough food".     Vitals are 160/87, pulse 112.  02 99%  Vitals entered. Pt complains of no dizziness or SHOB.    Will continue to monitor.

## 2017-11-20 NOTE — Progress Notes (Signed)
INFECTIOUS DISEASE ATTENDING ADDENDUM:   Date: 11/20/2017  Patient name: Willie Williamson  Medical record number: [id number]  Date of birth: Dec 31, 1930          [facility] Antimicrobial Management Team Staphylococcus aureus bacteremia   Staphylococcus aureus bacteremia (SAB) is associated with a high rate of complications and mortality.  Specific aspects of clinical management are critical to optimizing the outcome of patients with SAB.  Therefore, the [facility] Antimicrobial Management Team [facility]) has initiated an intervention aimed at improving the management of SAB at [facility].  To do so, Infectious Diseases physicians are providing an evidence-based consult for the management of all patients with SAB.     Yes No Comments  Perform follow-up blood cultures (even if the patient is afebrile) to ensure clearance of bacteremia [x]  []  BLOOD CULTURES from 12/31 NO growth but ONLY 24 hours  Remove vascular catheter and obtain follow-up blood cultures after the removal of the catheter [x]  []  DO NOT PLACE PICC LINE UNTIL CERTAIN BACTEREMIA CLEARING 72-96 hrs   Perform echocardiography to evaluate for endocarditis (transthoracic ECHO is 40-50% sensitive, TEE is > 90% sensitive) []  []  Please keep in mind, that neither test can definitively EXCLUDE endocarditis, and that should clinical suspicion remain high for endocarditis the patient should then still be treated with an "endocarditis" duration of therapy = 6 weeks  There is a lesion suspicious for vegetation  Consult electrophysiologist to evaluate implanted cardiac device (pacemaker, ICD) []  []    Ensure source control []  []  Have all abscesses been drained effectively? Have deep seeded infections (septic joints or osteomyelitis) had appropriate surgical debridement?  Investigate for "metastatic" sites of infection [x]  []  Does the patient have ANY symptom or physical exam finding that would suggest a deeper infection (back or neck pain  that may be suggestive of vertebral osteomyelitis or epidural abscess, muscle pain that could be a symptom of pyomyositis)?  Keep in mind that for deep seeded infections MRI imaging with contrast is preferred rather than other often insensitive tests such as plain x-rays, especially early in a patient's presentation.  MRI L spine shows fractures but no diskitis mentioned  Change antibiotic therapy to vancomycin []  []  Beta-lactam antibiotics are preferred for MSSA due to higher cure rates.   If on Vancomycin, goal trough should be 15 - 20 mcg/mL  Estimated duration of IV antibiotic therapy:  6 weeks of therapy []  []  Consult case management for probably prolonged outpatient IV antibiotic therapy    I think that daptomycin is a reasonable antibiotic IF insurance would cover it but I am going to assume that he will need to go home on IV vancomycin  Tentative plan IF cultures from yesterday would be as follows  Diagnosis: MRSA bacteremia and endocarditis  Culture Result: MRSA   Allergies  Allergen Reactions  . Penicillins Rash    Has patient had a PCN reaction causing immediate rash, facial/tongue/throat swelling, SOB or lightheadedness with hypotension: Yes Has patient had a PCN reaction causing severe rash involving mucus membranes or skin necrosis: No Has patient had a PCN reaction that required hospitalization: No Has patient had a PCN reaction occurring within the last 10 years: No If all of the above answers are "NO", then may proceed with Cephalosporin use.     OPAT Orders Discharge antibiotics: vancomycin Per pharmacy protocol vancomycin  Aim for Vancomycin trough 15-20 (unless otherwise indicated) Duration: 6 weeks End Date:  February 10th, 2019  [facility]  Per Protocol: BIWEEKLY LABS WHILE ON VANCOMYCIN:  _x_ BMP w GFR  weekly while on IV antibiotics:  _x_ CBC with differential  _x_ Vancomycin trough  _x_ Please pull PIC at completion of IV antibiotics __  Please leave PIC in place until doctor has seen patient or been notified  Fax weekly labs to [phone]  Clinic Follow Up Appt:  [facility] at [place] in next 3-4 weeks  I will arrange [place] 11/20/2017, 3:36 PM

## 2017-11-20 NOTE — Progress Notes (Signed)
PROGRESS NOTE    Willie Williamson  YNW:[id number] DOB: Sep 21, 1931 DOA: 11/16/2017 PCP: [name], MD   Brief Narrative:  Willie Williamson is a 84 y.o. male with medical history significant for emphysema chronic respiratory failure on chronic home O2 2-3 L, CAD, HTN, bladder cancer, BPH,.  Patient presented to the ED with complaints of back pain present over the past year both significantly worsened about 2 days ago. Pt denies falls, no lower extremity weakness.  Patient also endorses worsening difficulty breathing over the past 2 days.  Chronic nonproductive cough unchanged.  No fever no chills at home. Patient also complains of left arm pain, that started here in the ED. No chest pains. Patient son thinks patient was placed on prednisone about a week ago for COPD, and does not think patient is on chronic steroids.  ED Course: Blood pressure initially 133/83, dropped to 79/54, while on Cardizem drip.  Patient also tachycardic on admission pulse of 157.  Temperature max recorded in the ED 100.1.  Patient's O2 increased to 3 L [place], O2 sats greater than 87%.  Chest x-ray negative for acute abnormality, chest CT he negative for acute abnormality but showed multiple compression deformities which appear chronic.  EKG showed what appeared to be atrial flutter. Patient was given Cardizem 10 mg and subsequently placed on Cardizem drip.  Blood pressure dropped.  Patient was given 1 L normal saline bolus.  Patient's tachycardia improved.  Patient was also given 125 mg of Solu-Medrol, placed on antibiotics- vancomycin and Levaquin for fever , with ?focus of infection..   Blood cultures came back positive for MRSA on 11/17/2017.  Infectious disease was reflexively consulted.  .  Assessment & Plan:   Principal Problem:   COPD with emphysema ([facility]) Active Problems:   Essential hypertension   Chronic respiratory failure with hypoxia (H[facility])   Tachycardia   MRSA bacteremia  Sepsis 2/2 MRSA bacteremia -No  acute source identified -Infectious disease reflex consultation -discussed with ID- TEE not to add additional change in treatment therefore will discontinue consultation and treat patient with 6 weeks of IV abx -Continue vancomycin -Repeat blood cultures drawn on 11/18/2017 were once again positive for gram-positive cocci -Repeat blood cultures drawn on 11/19/2017: Will need to follow-up on these results -Will likely need PICC line placement and long-term IV antibiotics after second blood cultures negative - Echocardiogram showing There is an ECHO dense structure that is mildly   mobile that intermittently appears in the LVOT under the aortic valve but not attached to the valve. Suggest TEE to further define. The cavity size was normal. There was mild concentric hypertrophy. Systolic function was normal. The estimated ejection   fraction was in the range of 55% to 60%. Wall motion was normal; there were no regional wall motion abnormalities -Lumbar spine MRI not showing any source to explain bacteremia   Tachycardia -Heart rate in the 150s on telemetry today -Patient is currently tachycardic with a heart rate of low 100s -Continue Coreg -Continue telemetry and monitoring if patient's heart rate once again increases will need to further evaluate and possibly start her on a higher dose of beta-blocker  Elevated troponin with hx of CAD- 0.04.  -Troponins plateaued  Back pain -MRI of the lumbar spine to explain bacteremia -There is some question as to whether or not a protruding disc could be pressing on the L5 nerve however patient does not have any weakness or symptoms relevant to this     DVT prophylaxis:  Lovenox Code Status: DNR Family Communication: No family is bedside- called patients son and gave update Disposition Plan: Pending second set of repeat blood cultures   Consultants:   Infectious disease  Cardiology  Procedures:   Echocardiogram  Antimicrobials:    Vancomycin  levaquin   Subjective: Patient seen and evaluated.  He asks about his blood cultures done on 11/18/2017.  He understands that we had to draw blood cultures again yesterday as the ones from the 30th once again were positive for positive cocci.  Patient asked appropriate questions and all questions were answered.  Objective: Vitals:   11/20/17 0603 11/20/17 0820 11/20/17 0900 11/20/17 0957  BP: (!) 171/81 (!) 160/87    Pulse: 86 (!) 118 (!) 150   Resp: 18 18    Temp: 97.7 F (36.5 C)     TempSrc: Oral     SpO2: 99% 100% 96% 96%  Weight:      Height:        Intake/Output Summary (Last 24 hours) at 11/20/2017 1020 Last data filed at 11/20/2017 0604 Gross per 24 hour  Intake 803 ml  Output 2125 ml  Net -1322 ml   Filed Weights   11/18/17 0500 11/19/17 0500 11/20/17 0500  Weight: 66.1 kg (145 lb 11.6 oz) 65.9 kg (145 lb 4.5 oz) 64.1 kg (141 lb 4.8 oz)    Examination:  Physical Exam  Constitutional: He is oriented to person, place, and time and well-developed, well-nourished, and in no distress.  Neck: Normal range of motion. Neck supple.  Cardiovascular: Regular rhythm and normal heart sounds.  Tachycardia noted on telemetry  Pulmonary/Chest: Effort normal. No respiratory distress. He has wheezes. He exhibits no tenderness.  Abdominal: Soft. Bowel sounds are normal. He exhibits no distension. There is no tenderness.  Musculoskeletal: Normal range of motion.  Neurological: He is alert and oriented to person, place, and time.  Skin: Skin is warm and dry.  Ecchymoses on upper extremities bilaterally (see pictures)  Psychiatric: Mood and affect normal.               Data Reviewed: I have personally reviewed following labs and imaging studies  CBC: Recent Labs  Lab 11/16/17 1039  WBC 15.0*  HGB 12.5*  HCT 39.1  MCV 96.3  PLT 259   Basic Metabolic Panel: Recent Labs  Lab 11/16/17 1039 11/19/17 0458  NA 135 133*  K 4.2 4.0  CL 98* 98*   CO2 26 25  GLUCOSE 106* 156*  BUN 19 24*  CREATININE 0.82 0.81  CALCIUM 8.9 8.6*   GFR: Estimated Creatinine Clearance: 61.6 mL/min (by C-G formula based on SCr of 0.81 mg/dL). Liver Function Tests: Recent Labs  Lab 11/16/17 1058  AST 32  ALT 40  ALKPHOS 110  BILITOT 1.0  PROT 7.1  ALBUMIN 3.7   No results for input(s): LIPASE, AMYLASE in the last 168 hours. No results for input(s): AMMONIA in the last 168 hours. Coagulation Profile: No results for input(s): INR, PROTIME in the last 168 hours. Cardiac Enzymes: Recent Labs  Lab 11/16/17 1039 11/16/17 1740 11/17/17 0026  TROPONINI <0.03 0.04* 0.04*   BNP (last 3 results) No results for input(s): PROBNP in the last 8760 hours. HbA1C: No results for input(s): HGBA1C in the last 72 hours. CBG: No results for input(s): GLUCAP in the last 168 hours. Lipid Profile: No results for input(s): CHOL, HDL, LDLCALC, TRIG, CHOLHDL, LDLDIRECT in the last 72 hours. Thyroid Function Tests: No results for input(s): TSH, T4TOTAL,  FREET4, T3FREE, THYROIDAB in the last 72 hours. Anemia Panel: No results for input(s): VITAMINB12, FOLATE, FERRITIN, TIBC, IRON, RETICCTPCT in the last 72 hours. Sepsis Labs: Recent Labs  Lab 11/16/17 1107  LATICACIDVEN 1.85    Recent Results (from the past 240 hour(s))  Culture, blood (Routine X 2) w Reflex to ID Panel     Status: Abnormal   Collection Time: 11/16/17 11:07 AM  Result Value Ref Range Status   Specimen Description   Final    LEFT ANTECUBITAL BOTTLES DRAWN AEROBIC AND ANAEROBIC   Special Requests Blood Culture adequate volume  Final   Culture  Setup Time   Final    GRAM POSITIVE COCCI Gram Stain Report Called to,Read Back By and Verified With: HOWARD,C @ 9381 ON 11/17/17 BY JUW GS DONE @ [facility] ANAEROBIC BOTTLE ONLY CRITICAL RESULT CALLED TO, READ BACK BY AND VERIFIED WITH: S HALL,PHARMD AT 8299 11/17/17 BY L BENFIELD Performed at [facility], [place] [address], [place],  [place] [address]    Culture METHICILLIN RESISTANT STAPHYLOCOCCUS AUREUS (A)  Final   Report Status 11/20/2017 FINAL  Final   Organism ID, Bacteria METHICILLIN RESISTANT STAPHYLOCOCCUS AUREUS  Final      Susceptibility   Methicillin resistant staphylococcus aureus - MIC*    CIPROFLOXACIN <=0.5 SENSITIVE Sensitive     ERYTHROMYCIN >=8 RESISTANT Resistant     GENTAMICIN <=0.5 SENSITIVE Sensitive     OXACILLIN >=4 RESISTANT Resistant     TETRACYCLINE <=1 SENSITIVE Sensitive     VANCOMYCIN 1 SENSITIVE Sensitive     TRIMETH/SULFA <=10 SENSITIVE Sensitive     CLINDAMYCIN <=0.25 SENSITIVE Sensitive     RIFAMPIN <=0.5 SENSITIVE Sensitive     Inducible Clindamycin NEGATIVE Sensitive     * METHICILLIN RESISTANT STAPHYLOCOCCUS AUREUS  Blood Culture ID Panel (Reflexed)     Status: Abnormal   Collection Time: 11/16/17 11:07 AM  Result Value Ref Range Status   Enterococcus species NOT DETECTED NOT DETECTED Final   Listeria monocytogenes NOT DETECTED NOT DETECTED Final   Staphylococcus species DETECTED (A) NOT DETECTED Final    Comment: CRITICAL RESULT CALLED TO, READ BACK BY AND VERIFIED WITH: S HALL,PHARMD AT 6789 11/17/17 BY L BENFIELD    Staphylococcus aureus DETECTED (A) NOT DETECTED Final    Comment: Methicillin (oxacillin)-resistant Staphylococcus aureus (MRSA). MRSA is predictably resistant to beta-lactam antibiotics (except ceftaroline). Preferred therapy is vancomycin unless clinically contraindicated. Patient requires contact precautions if  hospitalized. CRITICAL RESULT CALLED TO, READ BACK BY AND VERIFIED WITH: S HALL,PHARMD AT 0756 11/17/17 BY L BENFIELD    Methicillin resistance DETECTED (A) NOT DETECTED Final    Comment: CRITICAL RESULT CALLED TO, READ BACK BY AND VERIFIED WITH: S HALL,PHARMD AT 0756 11/17/17 BY L BENFIELD    Streptococcus species NOT DETECTED NOT DETECTED Final   Streptococcus agalactiae NOT DETECTED NOT DETECTED Final   Streptococcus pneumoniae NOT DETECTED NOT  DETECTED Final   Streptococcus pyogenes NOT DETECTED NOT DETECTED Final   Acinetobacter baumannii NOT DETECTED NOT DETECTED Final   Enterobacteriaceae species NOT DETECTED NOT DETECTED Final   Enterobacter cloacae complex NOT DETECTED NOT DETECTED Final   Escherichia coli NOT DETECTED NOT DETECTED Final   Klebsiella oxytoca NOT DETECTED NOT DETECTED Final   Klebsiella pneumoniae NOT DETECTED NOT DETECTED Final   Proteus species NOT DETECTED NOT DETECTED Final   Serratia marcescens NOT DETECTED NOT DETECTED Final   Haemophilus influenzae NOT DETECTED NOT DETECTED Final   Neisseria meningitidis NOT DETECTED NOT DETECTED  Final   Pseudomonas aeruginosa NOT DETECTED NOT DETECTED Final   Candida albicans NOT DETECTED NOT DETECTED Final   Candida glabrata NOT DETECTED NOT DETECTED Final   Candida krusei NOT DETECTED NOT DETECTED Final   Candida parapsilosis NOT DETECTED NOT DETECTED Final   Candida tropicalis NOT DETECTED NOT DETECTED Final    Comment: Performed at [facility], [place] [address], [place], [place] [address]  Culture, blood (Routine X 2) w Reflex to ID Panel     Status: Abnormal   Collection Time: 11/16/17 11:08 AM  Result Value Ref Range Status   Specimen Description BLOOD LEFT ARM BOTTLES DRAWN AEROBIC AND ANAEROBIC  Final   Special Requests Blood Culture adequate volume  Final   Culture  Setup Time   Final    GRAM POSITIVE COCCI Gram Stain Report Called to,Read Back By and Verified With: HOWARD,C @  5397 ON 11/17/17 BY JUW GS DONE @ [facility] BOTH AEB + ANA BOTTLES    Culture (A)  Final    STAPHYLOCOCCUS AUREUS SUSCEPTIBILITIES PERFORMED ON PREVIOUS CULTURE WITHIN THE LAST 5 DAYS. Performed at [facility], [place] [address] [place], [place] [address]    Report Status 11/20/2017 FINAL  Final  Culture, Urine     Status: None   Collection Time: 11/16/17  3:11 PM  Result Value Ref Range Status   Specimen Description URINE, CLEAN CATCH  Final   Special Requests NONE   Final   Culture   Final    NO GROWTH Performed at [facility], [place] [address] [place], [place] [address]    Report Status 11/19/2017 FINAL  Final  MRSA PCR Screening     Status: Abnormal   Collection Time: 11/16/17 11:05 PM  Result Value Ref Range Status   MRSA by PCR POSITIVE (A) NEGATIVE Final    Comment:        The GeneXpert MRSA Assay (FDA approved for NASAL specimens only), is one component of a comprehensive MRSA colonization surveillance program. It is not intended to diagnose MRSA infection nor to guide or monitor treatment for MRSA infections. RESULT CALLED TO, READ BACK BY AND VERIFIED WITH: JEFFERSON V. @ 2409 ON [id number] BY [place] L.   Culture, blood (Routine X 2) w Reflex to ID Panel     Status: Abnormal   Collection Time: 11/18/17  2:26 PM  Result Value Ref Range Status   Specimen Description   Final    RIGHT ANTECUBITAL BOTTLES DRAWN AEROBIC AND ANAEROBIC   Special Requests Blood Culture adequate volume  Final   Culture  Setup Time   Final    GRAM POSITIVE COCCI RECOVERED FROM ANAEROBIC BOTTLE PREVIOUSLY CALLED POSITIVE AT 1030 11/19/17    Culture (A)  Final    STAPHYLOCOCCUS AUREUS SUSCEPTIBILITIES PERFORMED ON PREVIOUS CULTURE WITHIN THE LAST 5 DAYS. Performed at [facility], [place] [address] [place], [place] [address]    Report Status 11/20/2017 FINAL  Final  Culture, blood (Routine X 2) w Reflex to ID Panel     Status: None (Preliminary result)   Collection Time: 11/18/17  2:26 PM  Result Value Ref Range Status   Specimen Description   Final    BLOOD RIGHT WRIST BOTTLES DRAWN AEROBIC AND ANAEROBIC   Special Requests Blood Culture adequate volume  Final   Culture NO GROWTH 2 DAYS  Final   Report Status PENDING  Incomplete  Culture, blood (Routine X 2) w Reflex to ID Panel  Status: None (Preliminary result)   Collection Time: 11/19/17  4:09 PM  Result Value Ref Range Status   Specimen Description BLOOD LEFT FOREARM  Final    Special Requests   Final    BOTTLES DRAWN AEROBIC AND ANAEROBIC Blood Culture adequate volume   Culture NO GROWTH < 24 HOURS  Final   Report Status PENDING  Incomplete  Culture, blood (Routine X 2) w Reflex to ID Panel     Status: None (Preliminary result)   Collection Time: 11/19/17  4:09 PM  Result Value Ref Range Status   Specimen Description LEFT ANTECUBITAL  Final   Special Requests   Final    BOTTLES DRAWN AEROBIC AND ANAEROBIC Blood Culture adequate volume   Culture NO GROWTH < 24 HOURS  Final   Report Status PENDING  Incomplete         Radiology Studies: Mr Lumbar Spine Wo Contrast  Result Date: 11/19/2017 CLINICAL DATA:  Chronic low back pain. EXAM: MRI LUMBAR SPINE WITHOUT CONTRAST TECHNIQUE: Multiplanar, multisequence MR imaging of the lumbar spine was performed. No intravenous contrast was administered. COMPARISON:  CT scan abdomen and pelvis dated 08/04/2010 and CT scans dated 11/16/2017 and chest x-rays dated 04/20/2017 and 11/16/2017 FINDINGS: Segmentation:  Standard. Alignment:  Physiologic. Vertebrae: There are acute or subacute benign appearing compression fractures involving of T11 and T12. The fracture of the superior endplate of Q68 is subtle. The fracture of T12 is more prominent. Conus medullaris and cauda equina: Conus extends to the L1 level. Conus and cauda equina appear normal. Paraspinal and other soft tissues: Negative. Disc levels: T10-11: Minimal acute or subacute benign-appearing compression fracture of the superior endplate of T41. No disc bulging or protrusion. No protrusion of bone into the spinal canal. T11-12: Moderately severe benign-appearing compression fracture of T12. No protrusion of the T11-12 disc into the spinal canal. Slight protrusion of the posterior margin of T12 into the spinal canal without neural impingement. T12-L1: No bulging or protrusion of the disc into the spinal canal. The compression fracture of T12 involves the superior and inferior  endplates as well as the anterior and posterior margins of the vertebra. No neural impingement. L1-2:  Normal. L2-3: Disc desiccation with slight disc space narrowing with a small broad-based disc bulge without neural impingement. L3-4: Disc desiccation. Small broad-based disc protrusion slightly asymmetric to the right compressing the anterior aspect of the thecal sac but without focal neural impingement. L4-5: Small broad-based soft disc protrusion slightly asymmetric to the left. Combine with hypertrophy of the left ligamentum flavum the left lateral recess is encroached upon and could affect the left L5 nerve, best seen on image 36 of series 6. L5-S1: Small broad-based disc bulge. Small focal soft disc extrusion extending superiorly behind the body of L5 on the left best seen on images 40 and 41 of series 7. This could affect the left L5 nerve. IMPRESSION: 1. Acute or subacute benign-appearing compression fractures of T11 and T12. No neural impingement at either level. 2. Small broad-based soft disc protrusion at L4-5 with encroachment upon the left lateral recess which might affect the left L5 nerve. 3. Small soft disc extrusion to the left at L5-S1 above the disc space. This might affect the left L5 nerve. Electronically Signed   By: Lorriane Shire M.D.   On: 11/19/2017 10:52        Scheduled Meds: . aspirin EC  81 mg Oral Daily  . carvedilol  3.125 mg Oral BID WC  . Chlorhexidine Gluconate  Cloth  6 each Topical Q0600  . heparin  5,000 Units Subcutaneous Q8H  . ipratropium  0.5 mg Nebulization Q6H  . levalbuterol  0.63 mg Nebulization Q6H  . levothyroxine  88 mcg Oral QAC breakfast  . mouth rinse  15 mL Mouth Rinse BID  . methylPREDNISolone (SOLU-MEDROL) injection  40 mg Intravenous Q12H  . mometasone-formoterol  2 puff Inhalation BID  . mupirocin ointment  1 application Nasal BID  . pravastatin  10 mg Oral q1800  . sodium chloride flush  3 mL Intravenous Q12H   Continuous Infusions: .  vancomycin Stopped (11/20/17 0722)     LOS: 4 days    Time spent: 35 minutes    Loretha Stapler, MD [facility] Pager [phone]  If 7PM-7AM, please contact night-coverage [email or web address] Password [email or web address] 11/20/2017, 10:20 AM

## 2017-11-20 NOTE — Plan of Care (Signed)
  No Outcome Acute Rehab PT Goals(only PT should resolve) Pt Will Transfer Bed To Chair/Chair To Bed 11/20/2017 0911 by Geralyn Corwin, PT Flowsheets Taken 11/20/2017 0911  Pt will Transfer Bed to Chair/Chair to Bed with modified independence Pt Will Perform Standing Balance Or Pre-Gait 11/20/2017 0911 by Geralyn Corwin, PT Flowsheets Taken 11/20/2017 0911  Pt will perform standing balance or pre-gait  with Supervision Pt Will Ambulate 11/20/2017 0911 by Geralyn Corwin, PT Flowsheets Taken 11/20/2017 0911  Pt will Ambulate 50 feet;with modified independence;with least restrictive assistive device    Geraldine Solar PT, DPT

## 2017-11-20 NOTE — Evaluation (Signed)
Physical Therapy Evaluation Patient Details Name: Willie Williamson MRN: [id number] DOB: 05-16-1931 Today's Date: 11/20/2017   History of Present Illness  Willie Williamson is a 84 y.o. male with medical history significant for emphysema chronic respiratory failure on chronic home O2 2-3 L, CAD, HTN, bladder cancer, BPH,.  Patient presented to the ED with complaints of back pain present over the past year both significantly worsened about 2 days ago. Pt denies falls, no lower extremity weakness.  Patient also endorses worsening difficulty breathing over the past 2 days.  Chronic nonproductive cough unchanged.  No fever no chills at home.   Clinical Impression  Pt received lying in bed and was agreeable to PT evaluation. Pt from home alone where he was independent with all mobility, ADLs, and IADLs PTA, except for his laundry which his DIL does for him. Pt c/o LBP which started Thanksgiving night, which he attributes to lifting the [place] he cooked out of the oven. Pt mod I with bed mobility and transfers and required supervision for amb in room, mostly due to the IV pole. He was slightly unsteady with his gait but did not require any physical assistance or AD to complete task. He reported feeling generally weak since being admitted, but his MMT was grossly 4/5 throughout BLE. He reports that his family is going to stay with him overnight once he gets home so they will be able to assist in any way. PT recommending OPPT once ready for d/c to address pt's LBP and maximize pt's overall strength, balance, and gait to promote return to PLOF. Will continue to follow acutely.    Follow Up Recommendations Outpatient PT;Supervision - Intermittent    Equipment Recommendations  None recommended by PT    Recommendations for Other Services       Precautions / Restrictions Precautions Precautions: Fall Precaution Comments: due to IV pole and slowed gait speed Restrictions Weight Bearing Restrictions: No       Mobility  Bed Mobility Overal bed mobility: Modified Independent                Transfers Overall transfer level: Modified independent                  Ambulation/Gait Ambulation/Gait assistance: Supervision Ambulation Distance (Feet): 30 Feet Assistive device: None Gait Pattern/deviations: Decreased stride length;Shuffle   Gait velocity interpretation: <1.8 ft/sec, indicative of risk for recurrent falls General Gait Details: amb in room x 3 laps; generally slowed gait speed, slightly unsteady but able to maintain balance independently  Stairs            Wheelchair Mobility    Modified Rankin (Stroke Patients Only)       Balance Overall balance assessment: Needs assistance Sitting-balance support: Feet supported Sitting balance-Leahy Scale: Good     Standing balance support: No upper extremity supported Standing balance-Leahy Scale: Fair Standing balance comment: min unsteadiness with turns                             Pertinent Vitals/Pain Pain Assessment: No/denies pain    Home Living Family/patient expects to be discharged to:: Private residence Living Arrangements: Alone Available Help at Discharge: Family;Available 24 hours/day Type of Home: House Home Access: Stairs to enter Entrance Stairs-Rails: None Entrance Stairs-Number of Steps: 4 STE Home Layout: One level;Laundry or work area in basement(DIL comes and does his laundry which is in the basement) Home Equipment: Environmental consultant - 2 wheels;Cane -  single point;Bedside commode;Shower seat - built in      Prior Function Level of Independence: Independent         Comments: Pt is independent with all ADLs and IADLs, ambulation unlimited community distances without assistance; his DIL peforms his laundry for him     Hand Dominance   Dominant Hand: Right    Extremity/Trunk Assessment   Upper Extremity Assessment Upper Extremity Assessment: Overall WFL for tasks assessed     Lower Extremity Assessment Lower Extremity Assessment: Overall WFL for tasks assessed(grossly 4/5 MMT throughout BLE)    Cervical / Trunk Assessment Cervical / Trunk Assessment: Normal  Communication   Communication: No difficulties  Cognition Arousal/Alertness: Awake/alert Behavior During Therapy: WFL for tasks assessed/performed Overall Cognitive Status: Within Functional Limits for tasks assessed                                        General Comments      Exercises     Assessment/Plan    PT Assessment Patient needs continued PT services  PT Problem List Decreased strength;Decreased activity tolerance;Decreased balance;Decreased mobility;Cardiopulmonary status limiting activity;Pain       PT Treatment Interventions DME instruction;Gait training;Stair training;Functional mobility training;Therapeutic activities;Therapeutic exercise;Balance training;Neuromuscular re-education;Patient/family education;Manual techniques    PT Goals (Current goals can be found in the Care Plan section)  Acute Rehab PT Goals Patient Stated Goal: home PT Goal Formulation: With patient Time For Goal Achievement: 11/27/17 Potential to Achieve Goals: Good    Frequency Min 2X/week   Barriers to discharge        Co-evaluation               AM-PAC PT "6 Clicks" Daily Activity  Outcome Measure Difficulty turning over in bed (including adjusting bedclothes, sheets and blankets)?: None Difficulty moving from lying on back to sitting on the side of the bed? : A Little Difficulty sitting down on and standing up from a chair with arms (e.g., wheelchair, bedside commode, etc,.)?: None Help needed moving to and from a bed to chair (including a wheelchair)?: None Help needed walking in hospital room?: A Little Help needed climbing 3-5 steps with a railing? : A Lot 6 Click Score: 20    End of Session Equipment Utilized During Treatment: Gait belt Activity Tolerance:  Patient tolerated treatment well;Patient limited by fatigue Patient left: in bed;with call bell/phone within reach;with bed alarm set Nurse Communication: Mobility status PT Visit Diagnosis: Muscle weakness (generalized) (M62.81);Pain;Difficulty in walking, not elsewhere classified (R26.2);Other abnormalities of gait and mobility (R26.89) Pain - part of body: (LBP)    Time: 1194-1740 PT Time Calculation (min) (ACUTE ONLY): 20 min   Charges:   PT Evaluation $PT Eval Low Complexity: 1 Low     PT G Codes:   PT G-Codes **NOT FOR INPATIENT CLASS** Functional Assessment Tool Used: AM-PAC 6 Clicks Basic Mobility;Clinical judgement Functional Limitation: Mobility: Walking and moving around Mobility: Walking and Moving Around Current Status (C1448): At least 40 percent but less than 60 percent impaired, limited or restricted Mobility: Walking and Moving Around Goal Status [phone]): At least 40 percent but less than 60 percent impaired, limited or restricted      Geraldine Solar PT, DPT

## 2017-11-21 DIAGNOSIS — J441 Chronic obstructive pulmonary disease with (acute) exacerbation: Secondary | ICD-10-CM

## 2017-11-21 LAB — BASIC METABOLIC PANEL
Anion gap: 11 (ref 5–15)
BUN: 24 mg/dL — ABNORMAL HIGH (ref 6–20)
CHLORIDE: 100 mmol/L — AB (ref 101–111)
CO2: 24 mmol/L (ref 22–32)
CREATININE: 0.62 mg/dL (ref 0.61–1.24)
Calcium: 8.6 mg/dL — ABNORMAL LOW (ref 8.9–10.3)
GFR calc non Af Amer: >60 mL/min (ref >60)
Glucose, Bld: 132 mg/dL — ABNORMAL HIGH (ref 65–99)
Potassium: 4.1 mmol/L (ref 3.5–5.1)
Sodium: 135 mmol/L (ref 135–145)

## 2017-11-21 LAB — VANCOMYCIN, TROUGH: Vancomycin Tr: 15 ug/mL (ref 15–20)

## 2017-11-21 MED ORDER — CARVEDILOL 3.125 MG PO TABS
6.2500 mg | ORAL_TABLET | Freq: Two times a day (BID) | ORAL | Status: DC
  Administered 2017-11-21 – 2017-11-22 (×3): 6.25 mg via ORAL
  Filled 2017-11-21 (×3): qty 2

## 2017-11-21 MED ORDER — TAMSULOSIN HCL 0.4 MG PO CAPS
0.4000 mg | ORAL_CAPSULE | Freq: Every day | ORAL | Status: DC
  Administered 2017-11-21 – 2017-11-22 (×2): 0.4 mg via ORAL
  Filled 2017-11-21 (×3): qty 1

## 2017-11-21 MED ORDER — CARVEDILOL 3.125 MG PO TABS
3.1250 mg | ORAL_TABLET | Freq: Once | ORAL | Status: AC
  Administered 2017-11-21: 3.125 mg via ORAL
  Filled 2017-11-21: qty 1

## 2017-11-21 MED ORDER — PREDNISONE 20 MG PO TABS
40.0000 mg | ORAL_TABLET | Freq: Every day | ORAL | Status: DC
  Administered 2017-11-22: 40 mg via ORAL
  Filled 2017-11-21: qty 2

## 2017-11-21 NOTE — Progress Notes (Addendum)
Physical Therapy Treatment Patient Details Name: Willie Williamson MRN: [id number] DOB: 11-05-1931 Today's Date: 11/21/2017    History of Present Illness Willie Williamson is a 84 y.o. male with medical history significant for emphysema chronic respiratory failure on chronic home O2 2-3 L, CAD, HTN, bladder cancer, BPH,.  Patient presented to the ED with complaints of back pain present over the past year both significantly worsened about 2 days ago. Pt denies falls, no lower extremity weakness.  Patient also endorses worsening difficulty breathing over the past 2 days.  Chronic nonproductive cough unchanged.  No fever no chills at home.     PT Comments    Patient found in bed upon entering room. Patient reported increased fatigue today and 7/10 back pain. Patient performed bed mobility and transfers with modified independence. Patient required a few moments to feel settled before standing and once standing before ambulating. Patient ambulated 25 feet in the room with supervision and minimal guard and reached for wall 2 times. Patient reported fatigue and was returned to bed. Patient's O2 saturation found to be 99%. Patient would benefit from continued physical therapy to address weakness, activity intolerance, balance deficits, and improve functional mobility.   Patient suffers from COPD which impairs his ability to perform daily activities like walking, transferring, and prolonged standing in the home.  A walker alone will not resolve the issues with performing activities of daily living. A wheelchair will allow patient to safely perform daily activities.  The patient can self propel in the home.    Follow Up Recommendations  Outpatient PT;Supervision - Intermittent     Equipment Recommendations  Wheelchair and wheelchair cushion   Recommendations for Other Services       Precautions / Restrictions Precautions Precautions: Fall Restrictions Weight Bearing Restrictions: No    Mobility  Bed  Mobility Overal bed mobility: Modified Independent             General bed mobility comments: Patient was able to perform supine to sit at edge of bed with modified independence.   Transfers Overall transfer level: Modified independent Equipment used: None(3 L of Oxygen from in room)             General transfer comment: Patient performed sit to stand with modified independence. Patient needed a couple moments to feel balanced before ambulating. Patient denied any complaints of dizziness before ambulating.   Ambulation/Gait Ambulation/Gait assistance: Supervision;Min guard Ambulation Distance (Feet): 25 Feet Assistive device: None Gait Pattern/deviations: Decreased stride length;Decreased step length - right;Decreased step length - left   Gait velocity interpretation: Below normal speed for age/gender General Gait Details: Ambulated in room to the door and back 2 laps with slowed speed and some unsteadiness as noted by patient reaching for wall for balance 2 times.   Stairs            Wheelchair Mobility    Modified Rankin (Stroke Patients Only)       Balance Overall balance assessment: Needs assistance Sitting-balance support: Feet supported Sitting balance-Leahy Scale: Good Sitting balance - Comments: Patient was able to maintain sitting balance with feet supported and no signs of loss of balance.    Standing balance support: No upper extremity supported Standing balance-Leahy Scale: Fair Standing balance comment: Patient demonstrated minimal unsteadiness as he reached for the wall 2 times while ambulating within room.  Cognition Arousal/Alertness: Awake/alert Behavior During Therapy: WFL for tasks assessed/performed Overall Cognitive Status: Within Functional Limits for tasks assessed                                 General Comments: Patient stated he was feeling more tired than yesterday and was  having pain today.       Exercises      General Comments        Pertinent Vitals/Pain Pain Assessment: 0-10 Pain Score: 7  Pain Location: Patient stated pain in his back Pain Intervention(s): Limited activity within patient's tolerance;Monitored during session    Home Living                      Prior Function            PT Goals (current goals can now be found in the care plan section) Acute Rehab PT Goals Patient Stated Goal: home PT Goal Formulation: With patient Time For Goal Achievement: 11/27/17 Potential to Achieve Goals: Good Progress towards PT goals: Progressing toward goals    Frequency    Min 2X/week      PT Plan Current plan remains appropriate    Co-evaluation              AM-PAC PT "6 Clicks" Daily Activity  Outcome Measure  Difficulty turning over in bed (including adjusting bedclothes, sheets and blankets)?: None Difficulty moving from lying on back to sitting on the side of the bed? : A Little Difficulty sitting down on and standing up from a chair with arms (e.g., wheelchair, bedside commode, etc,.)?: None Help needed moving to and from a bed to chair (including a wheelchair)?: A Little Help needed walking in hospital room?: A Little Help needed climbing 3-5 steps with a railing? : A Lot 6 Click Score: 19    End of Session Equipment Utilized During Treatment: Gait belt Activity Tolerance: Patient tolerated treatment well;Patient limited by fatigue Patient left: in bed;with call bell/phone within reach;with bed alarm set Nurse Communication: Mobility status PT Visit Diagnosis: Muscle weakness (generalized) (M62.81);Pain;Difficulty in walking, not elsewhere classified (R26.2);Other abnormalities of gait and mobility (R26.89)     Time: 1410-1430 PT Time Calculation (min) (ACUTE ONLY): 20 min  Charges:  $Therapeutic Activity: 8-22 mins                    G Codes:      Clarene Critchley PT, DPT 4:18 PM,  11/21/17 [phone]

## 2017-11-21 NOTE — Plan of Care (Signed)
Pt had uneventful night; will continue to monitor.

## 2017-11-21 NOTE — Progress Notes (Signed)
Pt hasn't voided in approx. 6 hours. Bladder scan performed, 194 mL obtained. MD made aware. Order for Flomax to help urinary process return, and to continue to monitor patient before In and Out cath performed.

## 2017-11-21 NOTE — Progress Notes (Signed)
Pharmacy Antibiotic Note  Willie Williamson is a 84 y.o. male admitted on 11/16/2017 with fever and MRSA bacteremia. Pharmacy consulted for Vancomycin.  Vanc trough this am is 15 mcg/ml  Plan:  Continue Vancomycin 1000mg  IV q12h Monitor labs, renal fxn, progress and c/s     Height: 5\' 7"  (170.2 cm) Weight: 140 lb 3.2 oz (63.6 kg) IBW/kg (Calculated) : 66.1  Temp (24hrs), Avg:98.2 F (36.8 C), Min:97.4 F (36.3 C), Max:98.7 F (37.1 C)  Recent Labs  Lab 11/16/17 1039 11/16/17 1107 11/19/17 0458 11/21/17 0552 11/21/17 0556  WBC 15.0*  --   --   --   --   CREATININE 0.82  --  0.81 0.62  --   LATICACIDVEN  --  1.85  --   --   --   VANCOTROUGH  --   --  10*  --  15    Estimated Creatinine Clearance: 61.8 mL/min (by C-G formula based on SCr of 0.62 mg/dL).   Allergies  Allergen Reactions  . Penicillins Rash    Has patient had a PCN reaction causing immediate rash, facial/tongue/throat swelling, SOB or lightheadedness with hypotension: Yes Has patient had a PCN reaction causing severe rash involving mucus membranes or skin necrosis: No Has patient had a PCN reaction that required hospitalization: No Has patient had a PCN reaction occurring within the last 10 years: No If all of the above answers are "NO", then may proceed with Cephalosporin use.    Antimicrobials this admission: Vancomycin 12/28 >>  Aztreonam 12/28 >>  Levaquin 750mg  x 1 on 12/28  Dose adjustments this admission:  Microbiology results: 12/30 Repeat BCx: ngtd 12/28 BCx: MRSA 12/28 UCx:  (-) final 12/28 MRSA PCR: (+)  Thank you for allowing pharmacy to be a part of this patient's care.  Excell Seltzer [place] 11/21/2017 9:20 AM

## 2017-11-21 NOTE — Progress Notes (Signed)
PROGRESS NOTE                                                                                                                                                                                                             Patient Demographics:    Willie Williamson, is a 84 y.o. male, DOB - 1931/01/26, JWJ:[id number]  Admit date - 11/16/2017   Admitting Physician Ejiroghene Arlyce Dice, MD  Outpatient Primary MD for the patient is Burdine, Virgina Evener, MD  LOS - 5  Outpatient Specialists: None  Chief Complaint  Patient presents with  . Back Pain       Brief Narrative   84 year old male with history of emphysema with chronic respiratory failure on home O2 (2-3 L), CAD, hypertension, BPH history of bladder cancer presented to the ED with back pain for several months worsened over the past 2 days. No history of trauma or fall. Also reported worsened shortness of breath for the past 2 days with chronic nonproductive cough. Denied any fevers or chills at home. In the ED patient was in rapid A. fib and started on Cardizem drip but became hypotensive. He had low-grade fever with negative chest x-ray and CT chest for acute infection. Patient placed on empiric antibiotic for fever of unclear etiology. Blood culture positive for MRSA.   Subjective:   Patient reports his breathing to have improved. Remains afebrile.   Assessment  & Plan :   Principal problem Sepsis secondary to MRSA bacteremia. Source not clear. MRI of the lumbar spine negative for abscess or discitis. 2-D echo showing dense structure mildly mobile intermittently operating the LVOT under the aortic valve but not at rest of the valve. Showed normal EF. Repeat blood culture from 12/13 again positive for staph aureus. -ID (Dr Drucilla Schmidt) who recommended treating for infective endocarditis with IV vancomycin for at least 6 weeks (until 12/30/2017). Blood culture from 12/31 so far  negative and if no growth by tomorrow I will order for a PICC line. Weekly CBC, BMP and vancomycin and fax labs to [phone]   Follow-up in ID clinic in 3-4 weeks.  Sinus tachycardia Heart rate in 140s overnight. Increased dose of Coreg today.  COPD with mild exacerbation/ chronic hypoxic respiratory failure Continue scheduled nebs. Transition to oral prednisone.  Hypothyroidism Continue Synthroid.  Low back pain MRI of the  lumbar spine negative for discitis or abscess. Shows acute versus subacute compression fracture of T11-T12 without nerve impingement. PT recommended outpatient PT   Code Status : DO NOT RESUSCITATE  Family Communication  : None at bedside  Disposition Plan  : Home once PICC line placed, possibly 1-2 days  Barriers For Discharge : Active symptoms  Consults  :  ID  Procedures  : 2-D echo  DVT Prophylaxis  :  Lovenox -   Lab Results  Component Value Date   PLT 237 11/16/2017    Antibiotics  :   Anti-infectives (From admission, onward)   Start     Dose/Rate Route Frequency Ordered Stop   11/19/17 1800  vancomycin (VANCOCIN) IVPB 1000 mg/200 mL premix     1,000 mg 200 mL/hr over 60 Minutes Intravenous Every 12 hours 11/19/17 1021     11/17/17 1800  vancomycin (VANCOCIN) IVPB 750 mg/150 ml premix  Status:  Discontinued     750 mg 150 mL/hr over 60 Minutes Intravenous Every 12 hours 11/17/17 0738 11/19/17 1021   11/17/17 1100  aztreonam (AZACTAM) 1 g in dextrose 5 % 50 mL IVPB  Status:  Discontinued     1 g 100 mL/hr over 30 Minutes Intravenous Every 8 hours 11/17/17 0736 11/19/17 1020   11/17/17 0300  vancomycin (VANCOCIN) IVPB 750 mg/150 ml premix     750 mg 150 mL/hr over 60 Minutes Intravenous  Once 11/17/17 0217 11/17/17 0504   11/17/17 0200  aztreonam (AZACTAM) 2 g in dextrose 5 % 50 mL IVPB     2 g 100 mL/hr over 30 Minutes Intravenous  Once 11/17/17 0159 11/17/17 0336   11/16/17 1400  levofloxacin (LEVAQUIN) IVPB 750 mg     750  mg 100 mL/hr over 90 Minutes Intravenous  Once 11/16/17 1358 11/16/17 1642   11/16/17 1400  vancomycin (VANCOCIN) IVPB 1000 mg/200 mL premix     1,000 mg 200 mL/hr over 60 Minutes Intravenous  Once 11/16/17 1358 11/16/17 1642        Objective:   Vitals:   11/20/17 2107 11/21/17 0550 11/21/17 1021 11/21/17 1623  BP: (!) 154/84 (!) 159/87    Pulse: 77 81    Resp: 20 18    Temp: (!) 97.4 F (36.3 C) 98.7 F (37.1 C)    TempSrc: Oral Oral    SpO2: 99% 97% 96% 97%  Weight:  63.6 kg (140 lb 3.2 oz)    Height:        Wt Readings from Last 3 Encounters:  11/21/17 63.6 kg (140 lb 3.2 oz)  08/08/17 69.1 kg (152 lb 6 oz)  04/20/17 68.9 kg (152 lb)     Intake/Output Summary (Last 24 hours) at 11/21/2017 1709 Last data filed at 11/21/2017 1100 Gross per 24 hour  Intake 120 ml  Output 1600 ml  Net -1480 ml     Physical Exam  Gen: not in distress HEENT:moist mucosa, supple neck Chest: Few scattered rhonchi, CVS: S1 and S2 tachycardic, no murmurs GI: soft, NT, ND, BS+ Musculoskeletal: warm, no edema     Data Review:    CBC Recent Labs  Lab 11/16/17 1039  WBC 15.0*  HGB 12.5*  HCT 39.1  PLT 237  MCV 96.3  MCH 30.8  MCHC 32.0  RDW 13.8    Chemistries  Recent Labs  Lab 11/16/17 1039 11/16/17 1058 11/19/17 0458 11/21/17 0552  NA 135  --  133* 135  K 4.2  --  4.0 4.1  CL 98*  --  98* 100*  CO2 26  --  25 24  GLUCOSE 106*  --  156* 132*  BUN 19  --  24* 24*  CREATININE 0.82  --  0.81 0.62  CALCIUM 8.9  --  8.6* 8.6*  AST  --  32  --   --   ALT  --  40  --   --   ALKPHOS  --  110  --   --   BILITOT  --  1.0  --   --    ------------------------------------------------------------------------------------------------------------------ No results for input(s): CHOL, HDL, LDLCALC, TRIG, CHOLHDL, LDLDIRECT in the last 72 hours.  No results found for:  HGBA1C ------------------------------------------------------------------------------------------------------------------ No results for input(s): TSH, T4TOTAL, T3FREE, THYROIDAB in the last 72 hours.  Invalid input(s): FREET3 ------------------------------------------------------------------------------------------------------------------ No results for input(s): VITAMINB12, FOLATE, FERRITIN, TIBC, IRON, RETICCTPCT in the last 72 hours.  Coagulation profile No results for input(s): INR, PROTIME in the last 168 hours.  No results for input(s): DDIMER in the last 72 hours.  Cardiac Enzymes Recent Labs  Lab 11/16/17 1039 11/16/17 1740 11/17/17 0026  TROPONINI <0.03 0.04* 0.04*   ------------------------------------------------------------------------------------------------------------------    Component Value Date/Time   BNP 54.0 11/16/2017 1058    Inpatient Medications  Scheduled Meds: . aspirin EC  81 mg Oral Daily  . carvedilol  6.25 mg Oral BID WC  . Chlorhexidine Gluconate Cloth  6 each Topical Q0600  . heparin  5,000 Units Subcutaneous Q8H  . ipratropium  0.5 mg Nebulization TID  . levalbuterol  0.63 mg Nebulization TID  . levothyroxine  88 mcg Oral QAC breakfast  . mouth rinse  15 mL Mouth Rinse BID  . methylPREDNISolone (SOLU-MEDROL) injection  40 mg Intravenous Q12H  . mometasone-formoterol  2 puff Inhalation BID  . mupirocin ointment  1 application Nasal BID  . pravastatin  10 mg Oral q1800  . sodium chloride flush  3 mL Intravenous Q12H   Continuous Infusions: . vancomycin Stopped (11/21/17 0723)   PRN Meds:.acetaminophen **OR** acetaminophen, hydrALAZINE, HYDROcodone-acetaminophen, levalbuterol, nitroGLYCERIN, polyethylene glycol  Micro Results Recent Results (from the past 240 hour(s))  Culture, blood (Routine X 2) w Reflex to ID Panel     Status: Abnormal   Collection Time: 11/16/17 11:07 AM  Result Value Ref Range Status   Specimen Description    Final    LEFT ANTECUBITAL BOTTLES DRAWN AEROBIC AND ANAEROBIC   Special Requests Blood Culture adequate volume  Final   Culture  Setup Time   Final    GRAM POSITIVE COCCI Gram Stain Report Called to,Read Back By and Verified With: HOWARD,C @ 8295 ON 11/17/17 BY JUW GS DONE @ [facility] ANAEROBIC BOTTLE ONLY CRITICAL RESULT CALLED TO, READ BACK BY AND VERIFIED WITH: S HALL,PHARMD AT 6213 11/17/17 BY L BENFIELD Performed at [facility], [place] [address] [place], [place] [address]    Culture METHICILLIN RESISTANT STAPHYLOCOCCUS AUREUS (A)  Final   Report Status 11/20/2017 FINAL  Final   Organism ID, Bacteria METHICILLIN RESISTANT STAPHYLOCOCCUS AUREUS  Final      Susceptibility   Methicillin resistant staphylococcus aureus - MIC*    CIPROFLOXACIN <=0.5 SENSITIVE Sensitive     ERYTHROMYCIN >=8 RESISTANT Resistant     GENTAMICIN <=0.5 SENSITIVE Sensitive     OXACILLIN >=4 RESISTANT Resistant     TETRACYCLINE <=1 SENSITIVE Sensitive     VANCOMYCIN 1 SENSITIVE Sensitive     TRIMETH/SULFA <=10 SENSITIVE Sensitive     CLINDAMYCIN <=0.25 SENSITIVE  Sensitive     RIFAMPIN <=0.5 SENSITIVE Sensitive     Inducible Clindamycin NEGATIVE Sensitive     * METHICILLIN RESISTANT STAPHYLOCOCCUS AUREUS  Blood Culture ID Panel (Reflexed)     Status: Abnormal   Collection Time: 11/16/17 11:07 AM  Result Value Ref Range Status   Enterococcus species NOT DETECTED NOT DETECTED Final   Listeria monocytogenes NOT DETECTED NOT DETECTED Final   Staphylococcus species DETECTED (A) NOT DETECTED Final    Comment: CRITICAL RESULT CALLED TO, READ BACK BY AND VERIFIED WITH: S HALL,PHARMD AT 4098 11/17/17 BY L BENFIELD    Staphylococcus aureus DETECTED (A) NOT DETECTED Final    Comment: Methicillin (oxacillin)-resistant Staphylococcus aureus (MRSA). MRSA is predictably resistant to beta-lactam antibiotics (except ceftaroline). Preferred therapy is vancomycin unless clinically contraindicated. Patient requires contact  precautions if  hospitalized. CRITICAL RESULT CALLED TO, READ BACK BY AND VERIFIED WITH: S HALL,PHARMD AT 0756 11/17/17 BY L BENFIELD    Methicillin resistance DETECTED (A) NOT DETECTED Final    Comment: CRITICAL RESULT CALLED TO, READ BACK BY AND VERIFIED WITH: S HALL,PHARMD AT 0756 11/17/17 BY L BENFIELD    Streptococcus species NOT DETECTED NOT DETECTED Final   Streptococcus agalactiae NOT DETECTED NOT DETECTED Final   Streptococcus pneumoniae NOT DETECTED NOT DETECTED Final   Streptococcus pyogenes NOT DETECTED NOT DETECTED Final   Acinetobacter baumannii NOT DETECTED NOT DETECTED Final   Enterobacteriaceae species NOT DETECTED NOT DETECTED Final   Enterobacter cloacae complex NOT DETECTED NOT DETECTED Final   Escherichia coli NOT DETECTED NOT DETECTED Final   Klebsiella oxytoca NOT DETECTED NOT DETECTED Final   Klebsiella pneumoniae NOT DETECTED NOT DETECTED Final   Proteus species NOT DETECTED NOT DETECTED Final   Serratia marcescens NOT DETECTED NOT DETECTED Final   Haemophilus influenzae NOT DETECTED NOT DETECTED Final   Neisseria meningitidis NOT DETECTED NOT DETECTED Final   Pseudomonas aeruginosa NOT DETECTED NOT DETECTED Final   Candida albicans NOT DETECTED NOT DETECTED Final   Candida glabrata NOT DETECTED NOT DETECTED Final   Candida krusei NOT DETECTED NOT DETECTED Final   Candida parapsilosis NOT DETECTED NOT DETECTED Final   Candida tropicalis NOT DETECTED NOT DETECTED Final    Comment: Performed at [facility], [place]. [address] [place], [place] [address]  Culture, blood (Routine X 2) w Reflex to ID Panel     Status: Abnormal   Collection Time: 11/16/17 11:08 AM  Result Value Ref Range Status   Specimen Description BLOOD LEFT ARM BOTTLES DRAWN AEROBIC AND ANAEROBIC  Final   Special Requests Blood Culture adequate volume  Final   Culture  Setup Time   Final    GRAM POSITIVE COCCI Gram Stain Report Called to,Read Back By and Verified With: HOWARD,C @  7829  ON 11/17/17 BY JUW GS DONE @ [facility] BOTH AEB + ANA BOTTLES    Culture (A)  Final    STAPHYLOCOCCUS AUREUS SUSCEPTIBILITIES PERFORMED ON PREVIOUS CULTURE WITHIN THE LAST 5 DAYS. Performed at [facility], [place] [address], [place], [place] [address]    Report Status 11/20/2017 FINAL  Final  Culture, Urine     Status: None   Collection Time: 11/16/17  3:11 PM  Result Value Ref Range Status   Specimen Description URINE, CLEAN CATCH  Final   Special Requests NONE  Final   Culture   Final    NO GROWTH Performed at [facility], [place] [address] [place], [place] [address]    Report  Status 11/19/2017 FINAL  Final  MRSA PCR Screening     Status: Abnormal   Collection Time: 11/16/17 11:05 PM  Result Value Ref Range Status   MRSA by PCR POSITIVE (A) NEGATIVE Final    Comment:        The GeneXpert MRSA Assay (FDA approved for NASAL specimens only), is one component of a comprehensive MRSA colonization surveillance program. It is not intended to diagnose MRSA infection nor to guide or monitor treatment for MRSA infections. RESULT CALLED TO, READ BACK BY AND VERIFIED WITH: JEFFERSON V. @ [id number] ON [id number] BY HENDERSON L.   Culture, blood (Routine X 2) w Reflex to ID Panel     Status: Abnormal   Collection Time: 11/18/17  2:26 PM  Result Value Ref Range Status   Specimen Description   Final    RIGHT ANTECUBITAL BOTTLES DRAWN AEROBIC AND ANAEROBIC   Special Requests Blood Culture adequate volume  Final   Culture  Setup Time   Final    GRAM POSITIVE COCCI RECOVERED FROM ANAEROBIC BOTTLE PREVIOUSLY CALLED POSITIVE AT 1030 11/19/17    Culture (A)  Final    STAPHYLOCOCCUS AUREUS SUSCEPTIBILITIES PERFORMED ON PREVIOUS CULTURE WITHIN THE LAST 5 DAYS. Performed at [facility], [place] [address] [place], [place] [address]    Report Status 11/20/2017 FINAL  Final  Culture, blood (Routine X 2) w Reflex to ID Panel     Status: None (Preliminary result)   Collection Time:  11/18/17  2:26 PM  Result Value Ref Range Status   Specimen Description   Final    BLOOD RIGHT WRIST BOTTLES DRAWN AEROBIC AND ANAEROBIC   Special Requests Blood Culture adequate volume  Final   Culture NO GROWTH 3 DAYS  Final   Report Status PENDING  Incomplete  Culture, blood (Routine X 2) w Reflex to ID Panel     Status: None (Preliminary result)   Collection Time: 11/19/17  4:09 PM  Result Value Ref Range Status   Specimen Description BLOOD LEFT FOREARM  Final   Special Requests   Final    BOTTLES DRAWN AEROBIC AND ANAEROBIC Blood Culture adequate volume   Culture NO GROWTH 2 DAYS  Final   Report Status PENDING  Incomplete  Culture, blood (Routine X 2) w Reflex to ID Panel     Status: None (Preliminary result)   Collection Time: 11/19/17  4:09 PM  Result Value Ref Range Status   Specimen Description LEFT ANTECUBITAL  Final   Special Requests   Final    BOTTLES DRAWN AEROBIC AND ANAEROBIC Blood Culture adequate volume   Culture NO GROWTH 2 DAYS  Final   Report Status PENDING  Incomplete    Radiology Reports Dg Chest 2 View  Result Date: 11/16/2017 CLINICAL DATA:  Tachycardia, chest pain, shortness of breath EXAM: CHEST  2 VIEW COMPARISON:  04/20/2017 FINDINGS: Lungs are essentially clear. Bibasilar scarring. No focal consolidation. No pleural effusion or pneumothorax. The heart is normal in size. Moderate compression fracture deformity of a mid and lower vertebral body, possibly T5 and T11. Mild anterior wedging of an additional mid thoracic vertebral body, possibly T4. These findings are age indeterminate but new from the prior. IMPRESSION: No evidence of acute cardiopulmonary disease. Mild to moderate compression fracture deformities of three thoracic vertebral bodies, as described above. These findings are age indeterminate but new from 04/20/2017. Electronically Signed   By: Julian Hy M.D.   On: 11/16/2017 11:40   Mr Lumbar Spine Wo  Contrast  Result Date:  11/19/2017 CLINICAL DATA:  Chronic low back pain. EXAM: MRI LUMBAR SPINE WITHOUT CONTRAST TECHNIQUE: Multiplanar, multisequence MR imaging of the lumbar spine was performed. No intravenous contrast was administered. COMPARISON:  CT scan abdomen and pelvis dated 08/04/2010 and CT scans dated 11/16/2017 and chest x-rays dated 04/20/2017 and 11/16/2017 FINDINGS: Segmentation:  Standard. Alignment:  Physiologic. Vertebrae: There are acute or subacute benign appearing compression fractures involving of T11 and T12. The fracture of the superior endplate of B28 is subtle. The fracture of T12 is more prominent. Conus medullaris and cauda equina: Conus extends to the L1 level. Conus and cauda equina appear normal. Paraspinal and other soft tissues: Negative. Disc levels: T10-11: Minimal acute or subacute benign-appearing compression fracture of the superior endplate of U13. No disc bulging or protrusion. No protrusion of bone into the spinal canal. T11-12: Moderately severe benign-appearing compression fracture of T12. No protrusion of the T11-12 disc into the spinal canal. Slight protrusion of the posterior margin of T12 into the spinal canal without neural impingement. T12-L1: No bulging or protrusion of the disc into the spinal canal. The compression fracture of T12 involves the superior and inferior endplates as well as the anterior and posterior margins of the vertebra. No neural impingement. L1-2:  Normal. L2-3: Disc desiccation with slight disc space narrowing with a small broad-based disc bulge without neural impingement. L3-4: Disc desiccation. Small broad-based disc protrusion slightly asymmetric to the right compressing the anterior aspect of the thecal sac but without focal neural impingement. L4-5: Small broad-based soft disc protrusion slightly asymmetric to the left. Combine with hypertrophy of the left ligamentum flavum the left lateral recess is encroached upon and could affect the left L5 nerve, best seen  on image 36 of series 6. L5-S1: Small broad-based disc bulge. Small focal soft disc extrusion extending superiorly behind the body of L5 on the left best seen on images 40 and 41 of series 7. This could affect the left L5 nerve. IMPRESSION: 1. Acute or subacute benign-appearing compression fractures of T11 and T12. No neural impingement at either level. 2. Small broad-based soft disc protrusion at L4-5 with encroachment upon the left lateral recess which might affect the left L5 nerve. 3. Small soft disc extrusion to the left at L5-S1 above the disc space. This might affect the left L5 nerve. Electronically Signed   By: Lorriane Shire M.D.   On: 11/19/2017 10:52   Ct Angio Chest/abd/pel For Dissection W And/or Wo Contrast  Result Date: 11/16/2017 CLINICAL DATA:  Chest and low back pain, initial encounter EXAM: CT ANGIOGRAPHY CHEST, ABDOMEN AND PELVIS TECHNIQUE: Multidetector CT imaging through the chest, abdomen and pelvis was performed using the standard protocol during bolus administration of intravenous contrast. Multiplanar reconstructed images and MIPs were obtained and reviewed to evaluate the vascular anatomy. CONTRAST:  159mL ISOVUE-370 IOPAMIDOL (ISOVUE-370) INJECTION 76% COMPARISON:  None. FINDINGS: CTA CHEST FINDINGS Cardiovascular: The thoracic aorta and its branches demonstrate mild atherosclerotic changes. No aneurysmal dilatation or dissection is identified. Coronary calcifications are seen. No cardiac enlargement is seen. The pulmonary artery as visualized shows no large central embolus. Mediastinum/Nodes: Thoracic inlet is within normal limits. No hilar or mediastinal adenopathy is identified. The esophagus is within normal limits. Lungs/Pleura: Diffuse emphysematous changes are identified. Mild atelectatic changes are noted in the right lower lobe and lingula. No focal infiltrate or sizable parenchymal nodule is noted. No pleural effusion is seen. Musculoskeletal: There compression deformities  of T5-T6 and T12 identified. These are  new from June of 2018 but unchanged from the recent chest x-ray. They have a chronic appearance given significant sclerosis. There is also a fracture of the T5 spinous process identified. Review of the MIP images confirms the above findings. CTA ABDOMEN AND PELVIS FINDINGS VASCULAR Aorta: Diffuse atherosclerotic changes of the aorta are noted without aneurysmal dilatation. Celiac: Mild narrowing is noted proximally with poststenotic dilatation. Atherosclerotic changes are identified. SMA: Mild atherosclerotic changes are noted. No focal stenosis is seen. Renals: Proximal atherosclerotic changes are noted without focal stenosis. IMA: Patent without evidence of aneurysm, dissection, vasculitis or significant stenosis. Iliacs: Diffuse atherosclerotic changes are noted without aneurysmal dilatation or focal stenosis. Veins: No obvious venous abnormality within the limitations of this arterial phase study. Review of the MIP images confirms the above findings. NON-VASCULAR Hepatobiliary: Diffuse decreased attenuation is noted consistent with fatty infiltration. Gallbladder is well distended without cholelithiasis. Pancreas: Unremarkable. No pancreatic ductal dilatation or surrounding inflammatory changes. Spleen: Normal in size without focal abnormality. Adrenals/Urinary Tract: Adrenal glands are within normal limits. Nonobstructing 2 mm stone is noted in the lower pole of the right kidney. Bladder is well distended. Stomach/Bowel: The appendix is within normal limits without inflammatory change. No obstructive or inflammatory changes in the large and small bowel are noted. Lymphatic: No significant lymphadenopathy is noted. Reproductive: Prostatic calcifications are seen. Other: No abdominal wall hernia or abnormality. No abdominopelvic ascites. Musculoskeletal: Degenerative changes of lumbar spine are noted. No acute compression deformity is seen. Review of the MIP images confirms  the above findings. IMPRESSION: No evidence of acute aortic abnormality. Diffuse atherosclerotic changes are noted. Diffuse emphysematous changes. Multiple compression deformities which appear chronic in nature as described. A chronic appearing T5 spinous process fracture is noted as well. Nonobstructing right renal stone. Chronic changes as described above. Electronically Signed   By: Inez Catalina M.D.   On: 11/16/2017 15:19    Time Spent in minutes  25   Inaki Vantine M.D on 11/21/2017 at 5:09 PM  Between 7am to 7pm - Pager - [phone]  After 7pm go to [email or web address] - password [facility]  [facility] -  Office  [phone]

## 2017-11-21 NOTE — Progress Notes (Signed)
Willie Williamson catheter removed per MD order. Will continue to monitor for urinary output.

## 2017-11-22 DIAGNOSIS — I339 Acute and subacute endocarditis, unspecified: Secondary | ICD-10-CM

## 2017-11-22 DIAGNOSIS — S32000S Wedge compression fracture of unspecified lumbar vertebra, sequela: Secondary | ICD-10-CM

## 2017-11-22 DIAGNOSIS — N401 Enlarged prostate with lower urinary tract symptoms: Secondary | ICD-10-CM

## 2017-11-22 DIAGNOSIS — I330 Acute and subacute infective endocarditis: Secondary | ICD-10-CM | POA: Diagnosis present

## 2017-11-22 DIAGNOSIS — R531 Weakness: Secondary | ICD-10-CM

## 2017-11-22 DIAGNOSIS — A4102 Sepsis due to Methicillin resistant Staphylococcus aureus: Principal | ICD-10-CM

## 2017-11-22 DIAGNOSIS — N400 Enlarged prostate without lower urinary tract symptoms: Secondary | ICD-10-CM | POA: Diagnosis present

## 2017-11-22 MED ORDER — VANCOMYCIN IV (FOR PTA / DISCHARGE USE ONLY): 1000.0000 mg | Freq: Two times a day (BID) | INTRAVENOUS | 0 refills | Status: AC

## 2017-11-22 MED ORDER — HYDROCODONE-ACETAMINOPHEN 5-325 MG PO TABS: 1.0000 | ORAL_TABLET | Freq: Four times a day (QID) | ORAL | 0 refills | Status: DC | PRN

## 2017-11-22 MED ORDER — CARVEDILOL 3.125 MG PO TABS: 6.2500 mg | ORAL_TABLET | Freq: Two times a day (BID) | ORAL | 11 refills | Status: AC

## 2017-11-22 MED ORDER — TAMSULOSIN HCL 0.4 MG PO CAPS: 0.4000 mg | ORAL_CAPSULE | Freq: Every day | ORAL | 0 refills | Status: AC

## 2017-11-22 MED ORDER — VANCOMYCIN HCL IN DEXTROSE 1-5 GM/200ML-% IV SOLN: 1000.0000 mg | Freq: Two times a day (BID) | INTRAVENOUS | 0 refills | Status: DC

## 2017-11-22 NOTE — Discharge Summary (Addendum)
Physician Discharge Summary  Willie Williamson DTO:[id number] DOB: Jul 21, 1931 DOA: 11/16/2017  PCP: [name], MD  Admit date: 11/16/2017 Discharge date: 11/22/2017  Admitted From: Home Disposition:  Home  Recommendations for Outpatient Follow-up:  1. Follow up with PCP in 1-2 weeks 2. Patient is being discharged on IV vancomycin for MRSA bacteremia and infective endocarditis. 3. Weekly labs including CBC,BMET and vancomycin trough should be checked and results faxed to [facility] at [phone]. 4. Last day of antibiotic is 12/30/2017.  Home Health: PT and RN Equipment/Devices: Chronic Oxygen (3L via nasal cannula), wheelchair  Discharge Condition: Fair CODE STATUS: DO NOT RESUSCITATE Diet recommendation: Low sodium    Discharge Diagnoses:  Principal Problem:   Sepsis ( [place])   MRSA bacteremia  Active Problems:   Infective endocarditis   Essential hypertension   COPD with emphysema ([place])   Chronic respiratory failure with hypoxia ([facility])   Sinus Tachycardia   Lower thoracic vertebra fracture   BPH (benign prostatic hyperplasia)   Weakness generalized  Brief narrative/history of present illness Please refer to admission H&P for details, in brief, 84 year old male with history of emphysema with chronic respiratory failure on home O2 (2-3 L), CAD, hypertension, BPH history of bladder cancer presented to the ED with back pain for several months worsened over the past 2 days. No history of trauma or fall. Also reported worsened shortness of breath for the past 2 days with chronic nonproductive cough. Denied any fevers or chills at home. In the ED patient was in rapid A. fib and started on Cardizem drip but became hypotensive. He had low-grade fever with negative chest x-ray and CT chest for acute infection. Patient placed on empiric antibiotic for fever of unclear etiology. Blood culture positive for MRSA.    Hospital course   Principal problem Sepsis secondary to MRSA  bacteremia. MRI of the lumbar spine negative for abscess or discitis. 2-D echo showing dense structure mildly mobile intermittently operating the LVOT under the aortic valve but not at rest of the valve. Showed normal EF. Repeat blood culture from 12/13 again positive for staph aureus. -ID (Dr Drucilla Schmidt) who recommended treating for infective endocarditis with IV vancomycin for at least 6 weeks (until 12/30/2017). Blood culture from 12/31 negative for growth. PICC line to be placed prior to discharge for home IV antibiotic use. Weekly CBC, BMP and vancomycin  trough should be drawn and labs faxed to [phone].    Follow-up in ID clinic in 3-4 weeks.  Sinus tachycardia Elevated heart rate 140s. Improved with increased dose of Coreg.  COPD with mild exacerbation/ chronic hypoxic respiratory failure Received scheduled nebs. Was getting oral prednisone and will transition to his chronic low dose home prednisone upon discharge.  Hypothyroidism Continue Synthroid.  Low back pain MRI of the lumbar spine negative for discitis or abscess. Shows acute versus subacute compression fracture of T11-T12 without nerve impingement. Short course of Vicodin as needed for pain. Will arrange home health PT.  BPH Required Foley on admission. Now removed and able to void without difficulty. He does have BPH symptoms and we will add Flomax.   Family Communication  :  discussed plan with son on the phone  Disposition Plan  : Home   Consults  :  ID  Procedures  : 2-D echo    Discharge Instructions   Allergies as of 11/22/2017      Reactions   Penicillins Rash   Has patient had a PCN reaction causing immediate rash, facial/tongue/throat  swelling, SOB or lightheadedness with hypotension: Yes Has patient had a PCN reaction causing severe rash involving mucus membranes or skin necrosis: No Has patient had a PCN reaction that required hospitalization: No Has patient had a PCN reaction  occurring within the last 10 years: No If all of the above answers are "NO", then may proceed with Cephalosporin use.      Medication List    TAKE these medications   albuterol 108 (90 Base) MCG/ACT inhaler Commonly known as:  PROAIR HFA INHALE 2 PUFFS INTO THE LUNGS EVERY 4 (FOUR) HOURS AS NEEDED FOR WHEEZING OR SHORTNESS OF BREATH.   aspirin 81 MG tablet Take 81 mg by mouth daily.   budesonide-formoterol 160-4.5 MCG/ACT inhaler Commonly known as:  SYMBICORT Inhale 2 puffs into the lungs 2 (two) times daily.   carvedilol 3.125 MG tablet Commonly known as:  COREG Take 2 tablets (6.25 mg total) by mouth 2 (two) times daily with a meal. What changed:  how much to take   fluticasone 50 MCG/ACT nasal spray Commonly known as:  FLONASE PLACE 1 SPRAY INTO BOTH NOSTRILS 2 (TWO) TIMES DAILY AS NEEDED.   HYDROcodone-acetaminophen 5-325 MG tablet Commonly known as:  NORCO/VICODIN Take 1 tablet by mouth every 6 (six) hours as needed for moderate pain or severe pain.   levothyroxine 88 MCG tablet Commonly known as:  SYNTHROID, LEVOTHROID Take 88 mcg by mouth daily before breakfast.   lovastatin 20 MG tablet Commonly known as:  MEVACOR Take 20 mg by mouth at bedtime.   MUCINEX PO Take 400 mg by mouth 3 (three) times daily as needed (for congestion).   nitroGLYCERIN 0.4 MG SL tablet Commonly known as:  NITROSTAT Place 1 tablet (0.4 mg total) under the tongue every 5 (five) minutes as needed for chest pain (up to 3 doses).   predniSONE 10 MG tablet Commonly known as:  DELTASONE Take 15 mg by mouth daily.   tamsulosin 0.4 MG Caps capsule Commonly known as:  FLOMAX Take 1 capsule (0.4 mg total) by mouth daily after supper.   tiotropium 18 MCG inhalation capsule Commonly known as:  SPIRIVA HANDIHALER PLACE 1 CAPSULE (18 MCG TOTAL) INTO INHALER AND INHALE DAILY.   Tiotropium Bromide Monohydrate 2.5 MCG/ACT Aers Commonly known as:  SPIRIVA RESPIMAT Inhale 2 puffs into the lungs  daily.   vancomycin 1-5 GM/200ML-% Soln Commonly known as:  VANCOCIN Inject 200 mLs (1,000 mg total) into the vein every 12 (twelve) hours.      Follow-up Information    Burdine, Virgina Evener, MD. Schedule an appointment as soon as possible for a visit in 1 week(s).   Specialty:  Family Medicine Contact information: [place] [address] [phone]        Tommy Medal, Lavell Islam, MD. Call in 4 week(s).   Specialty:  Infectious Diseases Contact information: [address] [place] [address] [phone]          Allergies  Allergen Reactions  . Penicillins Rash    Has patient had a PCN reaction causing immediate rash, facial/tongue/throat swelling, SOB or lightheadedness with hypotension: Yes Has patient had a PCN reaction causing severe rash involving mucus membranes or skin necrosis: No Has patient had a PCN reaction that required hospitalization: No Has patient had a PCN reaction occurring within the last 10 years: No If all of the above answers are "NO", then may proceed with Cephalosporin use.       Procedures/Studies: Dg Chest 2 View  Result Date: 11/16/2017 CLINICAL DATA:  Tachycardia, chest pain, shortness of breath EXAM: CHEST  2 VIEW COMPARISON:  04/20/2017 FINDINGS: Lungs are essentially clear. Bibasilar scarring. No focal consolidation. No pleural effusion or pneumothorax. The heart is normal in size. Moderate compression fracture deformity of a mid and lower vertebral body, possibly T5 and T11. Mild anterior wedging of an additional mid thoracic vertebral body, possibly T4. These findings are age indeterminate but new from the prior. IMPRESSION: No evidence of acute cardiopulmonary disease. Mild to moderate compression fracture deformities of three thoracic vertebral bodies, as described above. These findings are age indeterminate but new from 04/20/2017. Electronically Signed   By: Julian Hy M.D.   On: 11/16/2017 11:40   Mr Lumbar  Spine Wo Contrast  Result Date: 11/19/2017 CLINICAL DATA:  Chronic low back pain. EXAM: MRI LUMBAR SPINE WITHOUT CONTRAST TECHNIQUE: Multiplanar, multisequence MR imaging of the lumbar spine was performed. No intravenous contrast was administered. COMPARISON:  CT scan abdomen and pelvis dated 08/04/2010 and CT scans dated 11/16/2017 and chest x-rays dated 04/20/2017 and 11/16/2017 FINDINGS: Segmentation:  Standard. Alignment:  Physiologic. Vertebrae: There are acute or subacute benign appearing compression fractures involving of T11 and T12. The fracture of the superior endplate of O13 is subtle. The fracture of T12 is more prominent. Conus medullaris and cauda equina: Conus extends to the L1 level. Conus and cauda equina appear normal. Paraspinal and other soft tissues: Negative. Disc levels: T10-11: Minimal acute or subacute benign-appearing compression fracture of the superior endplate of Y86. No disc bulging or protrusion. No protrusion of bone into the spinal canal. T11-12: Moderately severe benign-appearing compression fracture of T12. No protrusion of the T11-12 disc into the spinal canal. Slight protrusion of the posterior margin of T12 into the spinal canal without neural impingement. T12-L1: No bulging or protrusion of the disc into the spinal canal. The compression fracture of T12 involves the superior and inferior endplates as well as the anterior and posterior margins of the vertebra. No neural impingement. L1-2:  Normal. L2-3: Disc desiccation with slight disc space narrowing with a small broad-based disc bulge without neural impingement. L3-4: Disc desiccation. Small broad-based disc protrusion slightly asymmetric to the right compressing the anterior aspect of the thecal sac but without focal neural impingement. L4-5: Small broad-based soft disc protrusion slightly asymmetric to the left. Combine with hypertrophy of the left ligamentum flavum the left lateral recess is encroached upon and could  affect the left L5 nerve, best seen on image 36 of series 6. L5-S1: Small broad-based disc bulge. Small focal soft disc extrusion extending superiorly behind the body of L5 on the left best seen on images 40 and 41 of series 7. This could affect the left L5 nerve. IMPRESSION: 1. Acute or subacute benign-appearing compression fractures of T11 and T12. No neural impingement at either level. 2. Small broad-based soft disc protrusion at L4-5 with encroachment upon the left lateral recess which might affect the left L5 nerve. 3. Small soft disc extrusion to the left at L5-S1 above the disc space. This might affect the left L5 nerve. Electronically Signed   By: Lorriane Shire M.D.   On: 11/19/2017 10:52   Ct Angio Chest/abd/pel For Dissection W And/or Wo Contrast  Result Date: 11/16/2017 CLINICAL DATA:  Chest and low back pain, initial encounter EXAM: CT ANGIOGRAPHY CHEST, ABDOMEN AND PELVIS TECHNIQUE: Multidetector CT imaging through the chest, abdomen and pelvis was performed using the standard protocol during bolus administration of intravenous contrast. Multiplanar reconstructed  images and MIPs were obtained and reviewed to evaluate the vascular anatomy. CONTRAST:  166mL ISOVUE-370 IOPAMIDOL (ISOVUE-370) INJECTION 76% COMPARISON:  None. FINDINGS: CTA CHEST FINDINGS Cardiovascular: The thoracic aorta and its branches demonstrate mild atherosclerotic changes. No aneurysmal dilatation or dissection is identified. Coronary calcifications are seen. No cardiac enlargement is seen. The pulmonary artery as visualized shows no large central embolus. Mediastinum/Nodes: Thoracic inlet is within normal limits. No hilar or mediastinal adenopathy is identified. The esophagus is within normal limits. Lungs/Pleura: Diffuse emphysematous changes are identified. Mild atelectatic changes are noted in the right lower lobe and lingula. No focal infiltrate or sizable parenchymal nodule is noted. No pleural effusion is seen.  Musculoskeletal: There compression deformities of T5-T6 and T12 identified. These are new from June of 2018 but unchanged from the recent chest x-ray. They have a chronic appearance given significant sclerosis. There is also a fracture of the T5 spinous process identified. Review of the MIP images confirms the above findings. CTA ABDOMEN AND PELVIS FINDINGS VASCULAR Aorta: Diffuse atherosclerotic changes of the aorta are noted without aneurysmal dilatation. Celiac: Mild narrowing is noted proximally with poststenotic dilatation. Atherosclerotic changes are identified. SMA: Mild atherosclerotic changes are noted. No focal stenosis is seen. Renals: Proximal atherosclerotic changes are noted without focal stenosis. IMA: Patent without evidence of aneurysm, dissection, vasculitis or significant stenosis. Iliacs: Diffuse atherosclerotic changes are noted without aneurysmal dilatation or focal stenosis. Veins: No obvious venous abnormality within the limitations of this arterial phase study. Review of the MIP images confirms the above findings. NON-VASCULAR Hepatobiliary: Diffuse decreased attenuation is noted consistent with fatty infiltration. Gallbladder is well distended without cholelithiasis. Pancreas: Unremarkable. No pancreatic ductal dilatation or surrounding inflammatory changes. Spleen: Normal in size without focal abnormality. Adrenals/Urinary Tract: Adrenal glands are within normal limits. Nonobstructing 2 mm stone is noted in the lower pole of the right kidney. Bladder is well distended. Stomach/Bowel: The appendix is within normal limits without inflammatory change. No obstructive or inflammatory changes in the large and small bowel are noted. Lymphatic: No significant lymphadenopathy is noted. Reproductive: Prostatic calcifications are seen. Other: No abdominal wall hernia or abnormality. No abdominopelvic ascites. Musculoskeletal: Degenerative changes of lumbar spine are noted. No acute compression  deformity is seen. Review of the MIP images confirms the above findings. IMPRESSION: No evidence of acute aortic abnormality. Diffuse atherosclerotic changes are noted. Diffuse emphysematous changes. Multiple compression deformities which appear chronic in nature as described. A chronic appearing T5 spinous process fracture is noted as well. Nonobstructing right renal stone. Chronic changes as described above. Electronically Signed   By: Inez Catalina M.D.   On: 11/16/2017 15:19    2-D echo Study Conclusions  - Left ventricle: There is an ECHO dense structure that is mildly   mobile that intermittently appears in the LVOT under the aortic   valve but not attached to the valve. Suggest TEE to further   define. The cavity size was normal. There was mild concentric   hypertrophy. Systolic function was normal. The estimated ejection   fraction was in the range of 55% to 60%. Wall motion was normal;   there were no regional wall motion abnormalities. - Aortic valve: Trileaflet; mildly thickened, mildly calcified   leaflets. Valve mobility was mildly restricted. There was mild   stenosis. Valve area (VTI): 1.7 cm^2. Valve area (Vmax): 1.72   cm^2. Valve area (Vmean): 1.66 cm^2.  Subjective: Feels better overall but still weak ambulating around.  Discharge Exam: Vitals:   11/22/17 6767 11/22/17  0841  BP:    Pulse:    Resp:    Temp:    SpO2: 98% 98%   Vitals:   11/22/17 0553 11/22/17 0614 11/22/17 0821 11/22/17 0841  BP: (!) 156/79     Pulse: 91     Resp: (!) 22     Temp: 97.9 F (36.6 C)     TempSrc: Oral     SpO2: 98%  98% 98%  Weight:  64.2 kg (141 lb 8.6 oz)    Height:       Gen: not in distress HEENT:moist mucosa, supple neck Chest:  improved breath sounds bilaterally CVS: S1 and S2 normal, no murmurs GI: soft, NT, ND, BS+ Musculoskeletal: warm, no edema       The results of significant diagnostics from this hospitalization (including imaging, microbiology,  ancillary and laboratory) are listed below for reference.     Microbiology: Recent Results (from the past 240 hour(s))  Culture, blood (Routine X 2) w Reflex to ID Panel     Status: Abnormal   Collection Time: 11/16/17 11:07 AM  Result Value Ref Range Status   Specimen Description   Final    LEFT ANTECUBITAL BOTTLES DRAWN AEROBIC AND ANAEROBIC   Special Requests Blood Culture adequate volume  Final   Culture  Setup Time   Final    GRAM POSITIVE COCCI Gram Stain Report Called to,Read Back By and Verified With: HOWARD,C @ 3875 ON 11/17/17 BY JUW GS DONE @ [facility] ANAEROBIC BOTTLE ONLY CRITICAL RESULT CALLED TO, READ BACK BY AND VERIFIED WITH: S HALL,PHARMD AT 6433 11/17/17 BY L BENFIELD Performed at [facility], [place] [address], [place], [place] [address]    Culture METHICILLIN RESISTANT STAPHYLOCOCCUS AUREUS (A)  Final   Report Status 11/20/2017 FINAL  Final   Organism ID, Bacteria METHICILLIN RESISTANT STAPHYLOCOCCUS AUREUS  Final      Susceptibility   Methicillin resistant staphylococcus aureus - MIC*    CIPROFLOXACIN <=0.5 SENSITIVE Sensitive     ERYTHROMYCIN >=8 RESISTANT Resistant     GENTAMICIN <=0.5 SENSITIVE Sensitive     OXACILLIN >=4 RESISTANT Resistant     TETRACYCLINE <=1 SENSITIVE Sensitive     VANCOMYCIN 1 SENSITIVE Sensitive     TRIMETH/SULFA <=10 SENSITIVE Sensitive     CLINDAMYCIN <=0.25 SENSITIVE Sensitive     RIFAMPIN <=0.5 SENSITIVE Sensitive     Inducible Clindamycin NEGATIVE Sensitive     * METHICILLIN RESISTANT STAPHYLOCOCCUS AUREUS  Blood Culture ID Panel (Reflexed)     Status: Abnormal   Collection Time: 11/16/17 11:07 AM  Result Value Ref Range Status   Enterococcus species NOT DETECTED NOT DETECTED Final   Listeria monocytogenes NOT DETECTED NOT DETECTED Final   Staphylococcus species DETECTED (A) NOT DETECTED Final    Comment: CRITICAL RESULT CALLED TO, READ BACK BY AND VERIFIED WITH: S HALL,PHARMD AT 8416 11/17/17 BY L BENFIELD     Staphylococcus aureus DETECTED (A) NOT DETECTED Final    Comment: Methicillin (oxacillin)-resistant Staphylococcus aureus (MRSA). MRSA is predictably resistant to beta-lactam antibiotics (except ceftaroline). Preferred therapy is vancomycin unless clinically contraindicated. Patient requires contact precautions if  hospitalized. CRITICAL RESULT CALLED TO, READ BACK BY AND VERIFIED WITH: S HALL,PHARMD AT 0756 11/17/17 BY L BENFIELD    Methicillin resistance DETECTED (A) NOT DETECTED Final    Comment: CRITICAL RESULT CALLED TO, READ BACK BY AND VERIFIED WITH: S HALL,PHARMD AT 0756 11/17/17 BY L BENFIELD    Streptococcus species NOT DETECTED NOT DETECTED Final  Streptococcus agalactiae NOT DETECTED NOT DETECTED Final   Streptococcus pneumoniae NOT DETECTED NOT DETECTED Final   Streptococcus pyogenes NOT DETECTED NOT DETECTED Final   Acinetobacter baumannii NOT DETECTED NOT DETECTED Final   Enterobacteriaceae species NOT DETECTED NOT DETECTED Final   Enterobacter cloacae complex NOT DETECTED NOT DETECTED Final   Escherichia coli NOT DETECTED NOT DETECTED Final   Klebsiella oxytoca NOT DETECTED NOT DETECTED Final   Klebsiella pneumoniae NOT DETECTED NOT DETECTED Final   Proteus species NOT DETECTED NOT DETECTED Final   Serratia marcescens NOT DETECTED NOT DETECTED Final   Haemophilus influenzae NOT DETECTED NOT DETECTED Final   Neisseria meningitidis NOT DETECTED NOT DETECTED Final   Pseudomonas aeruginosa NOT DETECTED NOT DETECTED Final   Candida albicans NOT DETECTED NOT DETECTED Final   Candida glabrata NOT DETECTED NOT DETECTED Final   Candida krusei NOT DETECTED NOT DETECTED Final   Candida parapsilosis NOT DETECTED NOT DETECTED Final   Candida tropicalis NOT DETECTED NOT DETECTED Final    Comment: Performed at [facility], [place] [address] [place], [place] [address]  Culture, blood (Routine X 2) w Reflex to ID Panel     Status: Abnormal   Collection Time: 11/16/17 11:08 AM   Result Value Ref Range Status   Specimen Description BLOOD LEFT ARM BOTTLES DRAWN AEROBIC AND ANAEROBIC  Final   Special Requests Blood Culture adequate volume  Final   Culture  Setup Time   Final    GRAM POSITIVE COCCI Gram Stain Report Called to,Read Back By and Verified With: HOWARD,C @  0160 ON 11/17/17 BY JUW GS DONE @ [facility] BOTH AEB + ANA BOTTLES    Culture (A)  Final    STAPHYLOCOCCUS AUREUS SUSCEPTIBILITIES PERFORMED ON PREVIOUS CULTURE WITHIN THE LAST 5 DAYS. Performed at [facility], [place] [address] [place], [place] [address]    Report Status 11/20/2017 FINAL  Final  Culture, Urine     Status: None   Collection Time: 11/16/17  3:11 PM  Result Value Ref Range Status   Specimen Description URINE, CLEAN CATCH  Final   Special Requests NONE  Final   Culture   Final    NO GROWTH Performed at [facility], [place] [address], [place], [place] [address]    Report Status 11/19/2017 FINAL  Final  MRSA PCR Screening     Status: Abnormal   Collection Time: 11/16/17 11:05 PM  Result Value Ref Range Status   MRSA by PCR POSITIVE (A) NEGATIVE Final    Comment:        The GeneXpert MRSA Assay (FDA approved for NASAL specimens only), is one component of a comprehensive MRSA colonization surveillance program. It is not intended to diagnose MRSA infection nor to guide or monitor treatment for MRSA infections. RESULT CALLED TO, READ BACK BY AND VERIFIED WITH: JEFFERSON V. @ 2202 ON [id number] BY HENDERSON L.   Culture, blood (Routine X 2) w Reflex to ID Panel     Status: Abnormal   Collection Time: 11/18/17  2:26 PM  Result Value Ref Range Status   Specimen Description   Final    RIGHT ANTECUBITAL BOTTLES DRAWN AEROBIC AND ANAEROBIC   Special Requests Blood Culture adequate volume  Final   Culture  Setup Time   Final    GRAM POSITIVE COCCI RECOVERED FROM ANAEROBIC BOTTLE PREVIOUSLY CALLED POSITIVE AT 1030 11/19/17    Culture (A)  Final    STAPHYLOCOCCUS  AUREUS SUSCEPTIBILITIES PERFORMED ON PREVIOUS CULTURE  WITHIN THE LAST 5 DAYS. Performed at [facility], [place] [address], [place], [place] [address]    Report Status 11/20/2017 FINAL  Final  Culture, blood (Routine X 2) w Reflex to ID Panel     Status: None (Preliminary result)   Collection Time: 11/18/17  2:26 PM  Result Value Ref Range Status   Specimen Description   Final    BLOOD RIGHT WRIST BOTTLES DRAWN AEROBIC AND ANAEROBIC   Special Requests Blood Culture adequate volume  Final   Culture NO GROWTH 4 DAYS  Final   Report Status PENDING  Incomplete  Culture, blood (Routine X 2) w Reflex to ID Panel     Status: None (Preliminary result)   Collection Time: 11/19/17  4:09 PM  Result Value Ref Range Status   Specimen Description BLOOD LEFT FOREARM  Final   Special Requests   Final    BOTTLES DRAWN AEROBIC AND ANAEROBIC Blood Culture adequate volume   Culture NO GROWTH 3 DAYS  Final   Report Status PENDING  Incomplete  Culture, blood (Routine X 2) w Reflex to ID Panel     Status: None (Preliminary result)   Collection Time: 11/19/17  4:09 PM  Result Value Ref Range Status   Specimen Description LEFT ANTECUBITAL  Final   Special Requests   Final    BOTTLES DRAWN AEROBIC AND ANAEROBIC Blood Culture adequate volume   Culture NO GROWTH 3 DAYS  Final   Report Status PENDING  Incomplete     Labs: BNP (last 3 results) Recent Labs    11/16/17 1058  BNP 29.9   Basic Metabolic Panel: Recent Labs  Lab 11/16/17 1039 11/19/17 0458 11/21/17 0552  NA 135 133* 135  K 4.2 4.0 4.1  CL 98* 98* 100*  CO2 26 25 24   GLUCOSE 106* 156* 132*  BUN 19 24* 24*  CREATININE 0.82 0.81 0.62  CALCIUM 8.9 8.6* 8.6*   Liver Function Tests: Recent Labs  Lab 11/16/17 1058  AST 32  ALT 40  ALKPHOS 110  BILITOT 1.0  PROT 7.1  ALBUMIN 3.7   No results for input(s): LIPASE, AMYLASE in the last 168 hours. No results for input(s): AMMONIA in the last 168 hours. CBC: Recent Labs  Lab  11/16/17 1039  WBC 15.0*  HGB 12.5*  HCT 39.1  MCV 96.3  PLT 237   Cardiac Enzymes: Recent Labs  Lab 11/16/17 1039 11/16/17 1740 11/17/17 0026  TROPONINI <0.03 0.04* 0.04*   BNP: Invalid input(s): POCBNP CBG: No results for input(s): GLUCAP in the last 168 hours. D-Dimer No results for input(s): DDIMER in the last 72 hours. Hgb A1c No results for input(s): HGBA1C in the last 72 hours. Lipid Profile No results for input(s): CHOL, HDL, LDLCALC, TRIG, CHOLHDL, LDLDIRECT in the last 72 hours. Thyroid function studies No results for input(s): TSH, T4TOTAL, T3FREE, THYROIDAB in the last 72 hours.  Invalid input(s): FREET3 Anemia work up No results for input(s): VITAMINB12, FOLATE, FERRITIN, TIBC, IRON, RETICCTPCT in the last 72 hours. Urinalysis    Component Value Date/Time   COLORURINE YELLOW 11/16/2017 1500   APPEARANCEUR CLEAR 11/16/2017 1500   LABSPEC 1.021 11/16/2017 1500   PHURINE 6.0 11/16/2017 1500   GLUCOSEU NEGATIVE 11/16/2017 1500   HGBUR NEGATIVE 11/16/2017 1500   BILIRUBINUR NEGATIVE 11/16/2017 1500   KETONESUR NEGATIVE 11/16/2017 1500   PROTEINUR NEGATIVE 11/16/2017 1500   UROBILINOGEN 0.2 03/13/2014 1310   NITRITE NEGATIVE 11/16/2017 1500   LEUKOCYTESUR NEGATIVE 11/16/2017 1500  Sepsis Labs Invalid input(s): PROCALCITONIN,  WBC,  LACTICIDVEN Microbiology Recent Results (from the past 240 hour(s))  Culture, blood (Routine X 2) w Reflex to ID Panel     Status: Abnormal   Collection Time: 11/16/17 11:07 AM  Result Value Ref Range Status   Specimen Description   Final    LEFT ANTECUBITAL BOTTLES DRAWN AEROBIC AND ANAEROBIC   Special Requests Blood Culture adequate volume  Final   Culture  Setup Time   Final    GRAM POSITIVE COCCI Gram Stain Report Called to,Read Back By and Verified With: HOWARD,C @ 9485 ON 11/17/17 BY JUW GS DONE @ [facility] ANAEROBIC BOTTLE ONLY CRITICAL RESULT CALLED TO, READ BACK BY AND VERIFIED WITH: S HALL,PHARMD AT 4627 11/17/17  BY L BENFIELD Performed at [facility], [place] [address], [place], [place] [address]    Culture METHICILLIN RESISTANT STAPHYLOCOCCUS AUREUS (A)  Final   Report Status 11/20/2017 FINAL  Final   Organism ID, Bacteria METHICILLIN RESISTANT STAPHYLOCOCCUS AUREUS  Final      Susceptibility   Methicillin resistant staphylococcus aureus - MIC*    CIPROFLOXACIN <=0.5 SENSITIVE Sensitive     ERYTHROMYCIN >=8 RESISTANT Resistant     GENTAMICIN <=0.5 SENSITIVE Sensitive     OXACILLIN >=4 RESISTANT Resistant     TETRACYCLINE <=1 SENSITIVE Sensitive     VANCOMYCIN 1 SENSITIVE Sensitive     TRIMETH/SULFA <=10 SENSITIVE Sensitive     CLINDAMYCIN <=0.25 SENSITIVE Sensitive     RIFAMPIN <=0.5 SENSITIVE Sensitive     Inducible Clindamycin NEGATIVE Sensitive     * METHICILLIN RESISTANT STAPHYLOCOCCUS AUREUS  Blood Culture ID Panel (Reflexed)     Status: Abnormal   Collection Time: 11/16/17 11:07 AM  Result Value Ref Range Status   Enterococcus species NOT DETECTED NOT DETECTED Final   Listeria monocytogenes NOT DETECTED NOT DETECTED Final   Staphylococcus species DETECTED (A) NOT DETECTED Final    Comment: CRITICAL RESULT CALLED TO, READ BACK BY AND VERIFIED WITH: S HALL,PHARMD AT 9381 11/17/17 BY L BENFIELD    Staphylococcus aureus DETECTED (A) NOT DETECTED Final    Comment: Methicillin (oxacillin)-resistant Staphylococcus aureus (MRSA). MRSA is predictably resistant to beta-lactam antibiotics (except ceftaroline). Preferred therapy is vancomycin unless clinically contraindicated. Patient requires contact precautions if  hospitalized. CRITICAL RESULT CALLED TO, READ BACK BY AND VERIFIED WITH: S HALL,PHARMD AT 0756 11/17/17 BY L BENFIELD    Methicillin resistance DETECTED (A) NOT DETECTED Final    Comment: CRITICAL RESULT CALLED TO, READ BACK BY AND VERIFIED WITH: S HALL,PHARMD AT 0756 11/17/17 BY L BENFIELD    Streptococcus species NOT DETECTED NOT DETECTED Final   Streptococcus agalactiae  NOT DETECTED NOT DETECTED Final   Streptococcus pneumoniae NOT DETECTED NOT DETECTED Final   Streptococcus pyogenes NOT DETECTED NOT DETECTED Final   Acinetobacter baumannii NOT DETECTED NOT DETECTED Final   Enterobacteriaceae species NOT DETECTED NOT DETECTED Final   Enterobacter cloacae complex NOT DETECTED NOT DETECTED Final   Escherichia coli NOT DETECTED NOT DETECTED Final   Klebsiella oxytoca NOT DETECTED NOT DETECTED Final   Klebsiella pneumoniae NOT DETECTED NOT DETECTED Final   Proteus species NOT DETECTED NOT DETECTED Final   Serratia marcescens NOT DETECTED NOT DETECTED Final   Haemophilus influenzae NOT DETECTED NOT DETECTED Final   Neisseria meningitidis NOT DETECTED NOT DETECTED Final   Pseudomonas aeruginosa NOT DETECTED NOT DETECTED Final   Candida albicans NOT DETECTED NOT DETECTED Final   Candida glabrata NOT DETECTED NOT DETECTED Final  Candida krusei NOT DETECTED NOT DETECTED Final   Candida parapsilosis NOT DETECTED NOT DETECTED Final   Candida tropicalis NOT DETECTED NOT DETECTED Final    Comment: Performed at [facility], [place] [address], [place], [place] [address]  Culture, blood (Routine X 2) w Reflex to ID Panel     Status: Abnormal   Collection Time: 11/16/17 11:08 AM  Result Value Ref Range Status   Specimen Description BLOOD LEFT ARM BOTTLES DRAWN AEROBIC AND ANAEROBIC  Final   Special Requests Blood Culture adequate volume  Final   Culture  Setup Time   Final    GRAM POSITIVE COCCI Gram Stain Report Called to,Read Back By and Verified With: HOWARD,C @  6213 ON 11/17/17 BY JUW GS DONE @ [facility] BOTH AEB + ANA BOTTLES    Culture (A)  Final    STAPHYLOCOCCUS AUREUS SUSCEPTIBILITIES PERFORMED ON PREVIOUS CULTURE WITHIN THE LAST 5 DAYS. Performed at [facility], [place] [address] [place], [place] [address]    Report Status 11/20/2017 FINAL  Final  Culture, Urine     Status: None   Collection Time: 11/16/17  3:11 PM  Result Value Ref Range Status    Specimen Description URINE, CLEAN CATCH  Final   Special Requests NONE  Final   Culture   Final    NO GROWTH Performed at [facility], [place] [address] [place], [place] [address]    Report Status 11/19/2017 FINAL  Final  MRSA PCR Screening     Status: Abnormal   Collection Time: 11/16/17 11:05 PM  Result Value Ref Range Status   MRSA by PCR POSITIVE (A) NEGATIVE Final    Comment:        The GeneXpert MRSA Assay (FDA approved for NASAL specimens only), is one component of a comprehensive MRSA colonization surveillance program. It is not intended to diagnose MRSA infection nor to guide or monitor treatment for MRSA infections. RESULT CALLED TO, READ BACK BY AND VERIFIED WITH: JEFFERSON V. @ 2952 ON [id number] BY HENDERSON L.   Culture, blood (Routine X 2) w Reflex to ID Panel     Status: Abnormal   Collection Time: 11/18/17  2:26 PM  Result Value Ref Range Status   Specimen Description   Final    RIGHT ANTECUBITAL BOTTLES DRAWN AEROBIC AND ANAEROBIC   Special Requests Blood Culture adequate volume  Final   Culture  Setup Time   Final    GRAM POSITIVE COCCI RECOVERED FROM ANAEROBIC BOTTLE PREVIOUSLY CALLED POSITIVE AT 1030 11/19/17    Culture (A)  Final    STAPHYLOCOCCUS AUREUS SUSCEPTIBILITIES PERFORMED ON PREVIOUS CULTURE WITHIN THE LAST 5 DAYS. Performed at [facility], [place] [address], [place], [place] [address]    Report Status 11/20/2017 FINAL  Final  Culture, blood (Routine X 2) w Reflex to ID Panel     Status: None (Preliminary result)   Collection Time: 11/18/17  2:26 PM  Result Value Ref Range Status   Specimen Description   Final    BLOOD RIGHT WRIST BOTTLES DRAWN AEROBIC AND ANAEROBIC   Special Requests Blood Culture adequate volume  Final   Culture NO GROWTH 4 DAYS  Final   Report Status PENDING  Incomplete  Culture, blood (Routine X 2) w Reflex to ID Panel     Status: None (Preliminary result)   Collection Time: 11/19/17  4:09 PM  Result Value  Ref Range Status   Specimen Description BLOOD LEFT FOREARM  Final   Special Requests   Final    BOTTLES DRAWN AEROBIC AND ANAEROBIC Blood Culture adequate volume   Culture NO GROWTH 3 DAYS  Final   Report Status PENDING  Incomplete  Culture, blood (Routine X 2) w Reflex to ID Panel     Status: None (Preliminary result)   Collection Time: 11/19/17  4:09 PM  Result Value Ref Range Status   Specimen Description LEFT ANTECUBITAL  Final   Special Requests   Final    BOTTLES DRAWN AEROBIC AND ANAEROBIC Blood Culture adequate volume   Culture NO GROWTH 3 DAYS  Final   Report Status PENDING  Incomplete     Time coordinating discharge: Over 30 minutes  SIGNED:   Louellen Molder, MD  [facility] 11/22/2017, 11:56 AM Pager   If 7PM-7AM, please contact night-coverage [email or web address] Password [email or web address]

## 2017-11-22 NOTE — Progress Notes (Signed)
Willie Williamson discharged Home per MD order.  Discharge instructions reviewed and discussed with the patient, all questions and concerns answered. Copy of instructions and scripts given to patient.  Allergies as of 11/22/2017      Reactions   Penicillins Rash   Has patient had a PCN reaction causing immediate rash, facial/tongue/throat swelling, SOB or lightheadedness with hypotension: Yes Has patient had a PCN reaction causing severe rash involving mucus membranes or skin necrosis: No Has patient had a PCN reaction that required hospitalization: No Has patient had a PCN reaction occurring within the last 10 years: No If all of the above answers are "NO", then may proceed with Cephalosporin use.      Medication List    TAKE these medications   albuterol 108 (90 Base) MCG/ACT inhaler Commonly known as:  PROAIR HFA INHALE 2 PUFFS INTO THE LUNGS EVERY 4 (FOUR) HOURS AS NEEDED FOR WHEEZING OR SHORTNESS OF BREATH.   aspirin 81 MG tablet Take 81 mg by mouth daily.   budesonide-formoterol 160-4.5 MCG/ACT inhaler Commonly known as:  SYMBICORT Inhale 2 puffs into the lungs 2 (two) times daily.   carvedilol 3.125 MG tablet Commonly known as:  COREG Take 2 tablets (6.25 mg total) by mouth 2 (two) times daily with a meal. What changed:  how much to take   fluticasone 50 MCG/ACT nasal spray Commonly known as:  FLONASE PLACE 1 SPRAY INTO BOTH NOSTRILS 2 (TWO) TIMES DAILY AS NEEDED.   HYDROcodone-acetaminophen 5-325 MG tablet Commonly known as:  NORCO/VICODIN Take 1 tablet by mouth every 6 (six) hours as needed for moderate pain or severe pain.   levothyroxine 88 MCG tablet Commonly known as:  SYNTHROID, LEVOTHROID Take 88 mcg by mouth daily before breakfast.   lovastatin 20 MG tablet Commonly known as:  MEVACOR Take 20 mg by mouth at bedtime.   MUCINEX PO Take 400 mg by mouth 3 (three) times daily as needed (for congestion).   nitroGLYCERIN 0.4 MG SL tablet Commonly known as:   NITROSTAT Place 1 tablet (0.4 mg total) under the tongue every 5 (five) minutes as needed for chest pain (up to 3 doses).   predniSONE 10 MG tablet Commonly known as:  DELTASONE Take 15 mg by mouth daily.   tamsulosin 0.4 MG Caps capsule Commonly known as:  FLOMAX Take 1 capsule (0.4 mg total) by mouth daily after supper.   tiotropium 18 MCG inhalation capsule Commonly known as:  SPIRIVA HANDIHALER PLACE 1 CAPSULE (18 MCG TOTAL) INTO INHALER AND INHALE DAILY.   Tiotropium Bromide Monohydrate 2.5 MCG/ACT Aers Commonly known as:  SPIRIVA RESPIMAT Inhale 2 puffs into the lungs daily.   vancomycin 1-5 GM/200ML-% Soln Commonly known as:  VANCOCIN Inject 200 mLs (1,000 mg total) into the vein every 12 (twelve) hours.   vancomycin IVPB Inject 1,000 mg into the vein every 12 (twelve) hours. Indication:  MRSA Bacteremia Last Day of Therapy:  12/30/2017 Labs - Sunday/Monday:  CBC/D, BMP, and vancomycin trough. Labs - Thursday:  BMP and vancomycin trough Labs - Every other week:  ESR and CRP            Home Infusion Instuctions  (From admission, onward)        Start     Ordered   11/22/17 0000  Home infusion instructions [facility] May follow [facility] Dosing Protocol; May administer Cathflo as needed to maintain patency of vascular access device.; Flushing of vascular access device: per [facility] Protocol: 0.9% NaCl pre/post  medica...    Question Answer Comment  Instructions May follow [facility] Dosing Protocol   Instructions May administer Cathflo as needed to maintain patency of vascular access device.   Instructions Flushing of vascular access device: per [facility] Protocol: 0.9% NaCl pre/post medication administration and prn patency; Heparin 100 u/ml, 17m for implanted ports and Heparin 10u/ml, 551mfor all other central venous catheters.   Instructions May follow [facility] Anaphylaxis Protocol for First Dose Administration in the home: 0.9% NaCl at 25-50 ml/hr to maintain IV  access for protocol meds. Epinephrine 0.3 ml IV/IM PRN and Benadryl 25-50 IV/IM PRN s/s of anaphylaxis.   Instructions [facility] Infusion Coordinator (RN) to assist per patient IV care needs in the home PRN.      11/22/17 1259       Durable Medical Equipment  (From admission, onward)        Start     Ordered   11/22/17 1306  For home use only DME lightweight manual wheelchair with seat cushion  Once    Comments:  Patient suffers from generalized weakness which impairs their ability to perform daily activities like ADLs in the home.  A walking aid will not resolve  issue with performing activities of daily living. A wheelchair will allow patient to safely perform daily activities. Patient is not able to propel themselves in the home using a standard weight wheelchair due to weakness. Patient can self propel in the lightweight wheelchair.  Accessories: elevating leg rests (ELRs), wheel locks, extensions and anti-tippers.   11/22/17 1306       Pt going home with right upper arm PICC for home IV antibiotic therapy.  Patient escorted to car by NT in a wheelchair,  no distress noted upon discharge.  BoRalene Muskrattone 11/22/2017 6:39 PM

## 2017-11-22 NOTE — Care Management Important Message (Signed)
Important Message  Patient Details  Name: Willie Williamson MRN: [id number] Date of Birth: 14-Dec-1930   Medicare Important Message Given:  Yes    Banjamin Stovall, [place]un[place]y [place], RN 11/22/2017, 1:32 PM

## 2017-11-22 NOTE — Care Management Note (Signed)
Case Management Note  Patient Details  Name: Willie Williamson MRN: [id number] Date of Birth: [birth year]  If discussed at Long Length of Stay Meetings, dates discussed:  11/22/2017  Additional Comments:  Anvika Gashi, [place]un[place], RN 11/22/2017, 12:06 PM

## 2017-11-22 NOTE — Care Management (Signed)
Patient discharging home today with home health RN and PT provided by [facility]. Patient would like a WC delivered to room as well prior to DC. Vaughan Basta of [facility] notified.  Patient will receive Vancomycin dose at 4pm today prior to DC and HHA will see patient tomorrow for 6AM dose and teaching.  Patient aware of details and has talked with [facility] and has their paperwork and phone number.  Son to bring port O2 tank from home and will take patient home today.

## 2017-11-22 NOTE — Progress Notes (Signed)
PHARMACY CONSULT NOTE FOR:  OUTPATIENT  PARENTERAL ANTIBIOTIC THERAPY (OPAT)  Indication: MRSA Bacteremia Regimen: Vancomycin 1gm IV ever 12 hours. End date: 12/30/2017  IV antibiotic discharge orders are pended. To discharging provider:  please sign these orders via discharge navigator,  Select New Orders & click on the button choice - Manage This Unsigned Work.    Thank you for allowing pharmacy to be a part of this patient's care.  Pricilla Larsson 11/22/2017, 12:25 PM

## 2017-11-22 NOTE — Progress Notes (Signed)
[facility] contacted for insertion of PICC line.

## 2017-11-23 DIAGNOSIS — A419 Sepsis, unspecified organism: Secondary | ICD-10-CM | POA: Diagnosis not present

## 2017-11-23 DIAGNOSIS — J439 Emphysema, unspecified: Secondary | ICD-10-CM | POA: Diagnosis not present

## 2017-11-23 DIAGNOSIS — I10 Essential (primary) hypertension: Secondary | ICD-10-CM | POA: Diagnosis not present

## 2017-11-23 DIAGNOSIS — Z5181 Encounter for therapeutic drug level monitoring: Secondary | ICD-10-CM | POA: Diagnosis not present

## 2017-11-23 DIAGNOSIS — J9611 Chronic respiratory failure with hypoxia: Secondary | ICD-10-CM | POA: Diagnosis not present

## 2017-11-23 DIAGNOSIS — M6281 Muscle weakness (generalized): Secondary | ICD-10-CM | POA: Diagnosis not present

## 2017-11-23 DIAGNOSIS — R7881 Bacteremia: Secondary | ICD-10-CM | POA: Diagnosis not present

## 2017-11-23 DIAGNOSIS — Z8551 Personal history of malignant neoplasm of bladder: Secondary | ICD-10-CM | POA: Diagnosis not present

## 2017-11-23 DIAGNOSIS — Z452 Encounter for adjustment and management of vascular access device: Secondary | ICD-10-CM | POA: Diagnosis not present

## 2017-11-23 DIAGNOSIS — R000 Tachycardia, unspecified: Secondary | ICD-10-CM | POA: Diagnosis not present

## 2017-11-23 DIAGNOSIS — N400 Enlarged prostate without lower urinary tract symptoms: Secondary | ICD-10-CM | POA: Diagnosis not present

## 2017-11-23 DIAGNOSIS — I2510 Atherosclerotic heart disease of native coronary artery without angina pectoris: Secondary | ICD-10-CM | POA: Diagnosis not present

## 2017-11-23 DIAGNOSIS — I330 Acute and subacute infective endocarditis: Secondary | ICD-10-CM | POA: Diagnosis not present

## 2017-11-23 LAB — CULTURE, BLOOD (ROUTINE X 2)
Culture: NO GROWTH
Special Requests: ADEQUATE

## 2017-11-24 LAB — CULTURE, BLOOD (ROUTINE X 2)
CULTURE: NO GROWTH
Culture: NO GROWTH
SPECIAL REQUESTS: ADEQUATE
Special Requests: ADEQUATE

## 2017-11-25 DIAGNOSIS — R7881 Bacteremia: Secondary | ICD-10-CM | POA: Diagnosis not present

## 2017-11-25 DIAGNOSIS — M6281 Muscle weakness (generalized): Secondary | ICD-10-CM | POA: Diagnosis not present

## 2017-11-25 DIAGNOSIS — A419 Sepsis, unspecified organism: Secondary | ICD-10-CM | POA: Diagnosis not present

## 2017-11-25 DIAGNOSIS — Z8551 Personal history of malignant neoplasm of bladder: Secondary | ICD-10-CM | POA: Diagnosis not present

## 2017-11-25 DIAGNOSIS — I330 Acute and subacute infective endocarditis: Secondary | ICD-10-CM | POA: Diagnosis not present

## 2017-11-25 DIAGNOSIS — Z5181 Encounter for therapeutic drug level monitoring: Secondary | ICD-10-CM | POA: Diagnosis not present

## 2017-11-25 DIAGNOSIS — N400 Enlarged prostate without lower urinary tract symptoms: Secondary | ICD-10-CM | POA: Diagnosis not present

## 2017-11-26 DIAGNOSIS — A419 Sepsis, unspecified organism: Secondary | ICD-10-CM | POA: Diagnosis not present

## 2017-11-26 DIAGNOSIS — J9611 Chronic respiratory failure with hypoxia: Secondary | ICD-10-CM | POA: Diagnosis not present

## 2017-11-26 DIAGNOSIS — Z452 Encounter for adjustment and management of vascular access device: Secondary | ICD-10-CM | POA: Diagnosis not present

## 2017-11-26 DIAGNOSIS — M6281 Muscle weakness (generalized): Secondary | ICD-10-CM | POA: Diagnosis not present

## 2017-11-26 DIAGNOSIS — I38 Endocarditis, valve unspecified: Secondary | ICD-10-CM | POA: Diagnosis not present

## 2017-11-26 DIAGNOSIS — N400 Enlarged prostate without lower urinary tract symptoms: Secondary | ICD-10-CM | POA: Diagnosis not present

## 2017-11-26 DIAGNOSIS — R000 Tachycardia, unspecified: Secondary | ICD-10-CM | POA: Diagnosis not present

## 2017-11-26 DIAGNOSIS — I330 Acute and subacute infective endocarditis: Secondary | ICD-10-CM | POA: Diagnosis not present

## 2017-11-26 DIAGNOSIS — R7881 Bacteremia: Secondary | ICD-10-CM | POA: Diagnosis not present

## 2017-11-26 DIAGNOSIS — I2510 Atherosclerotic heart disease of native coronary artery without angina pectoris: Secondary | ICD-10-CM | POA: Diagnosis not present

## 2017-11-26 DIAGNOSIS — I10 Essential (primary) hypertension: Secondary | ICD-10-CM | POA: Diagnosis not present

## 2017-11-26 DIAGNOSIS — B9562 Methicillin resistant Staphylococcus aureus infection as the cause of diseases classified elsewhere: Secondary | ICD-10-CM | POA: Diagnosis not present

## 2017-11-26 DIAGNOSIS — J439 Emphysema, unspecified: Secondary | ICD-10-CM | POA: Diagnosis not present

## 2017-11-26 DIAGNOSIS — Z5181 Encounter for therapeutic drug level monitoring: Secondary | ICD-10-CM | POA: Diagnosis not present

## 2017-11-26 DIAGNOSIS — Z8551 Personal history of malignant neoplasm of bladder: Secondary | ICD-10-CM | POA: Diagnosis not present

## 2017-11-26 DIAGNOSIS — B9561 Methicillin susceptible Staphylococcus aureus infection as the cause of diseases classified elsewhere: Secondary | ICD-10-CM | POA: Diagnosis not present

## 2017-11-28 DIAGNOSIS — R7881 Bacteremia: Secondary | ICD-10-CM | POA: Diagnosis not present

## 2017-11-28 DIAGNOSIS — S8992XA Unspecified injury of left lower leg, initial encounter: Secondary | ICD-10-CM | POA: Diagnosis not present

## 2017-11-28 DIAGNOSIS — J449 Chronic obstructive pulmonary disease, unspecified: Secondary | ICD-10-CM | POA: Diagnosis not present

## 2017-11-28 DIAGNOSIS — J439 Emphysema, unspecified: Secondary | ICD-10-CM | POA: Diagnosis not present

## 2017-11-28 DIAGNOSIS — Z452 Encounter for adjustment and management of vascular access device: Secondary | ICD-10-CM | POA: Diagnosis not present

## 2017-11-28 DIAGNOSIS — M6281 Muscle weakness (generalized): Secondary | ICD-10-CM | POA: Diagnosis not present

## 2017-11-28 DIAGNOSIS — I2510 Atherosclerotic heart disease of native coronary artery without angina pectoris: Secondary | ICD-10-CM | POA: Diagnosis not present

## 2017-11-28 DIAGNOSIS — I330 Acute and subacute infective endocarditis: Secondary | ICD-10-CM | POA: Diagnosis not present

## 2017-11-28 DIAGNOSIS — R269 Unspecified abnormalities of gait and mobility: Secondary | ICD-10-CM | POA: Diagnosis not present

## 2017-11-28 DIAGNOSIS — J9611 Chronic respiratory failure with hypoxia: Secondary | ICD-10-CM | POA: Diagnosis not present

## 2017-11-28 DIAGNOSIS — I10 Essential (primary) hypertension: Secondary | ICD-10-CM | POA: Diagnosis not present

## 2017-11-28 DIAGNOSIS — N400 Enlarged prostate without lower urinary tract symptoms: Secondary | ICD-10-CM | POA: Diagnosis not present

## 2017-11-28 DIAGNOSIS — R000 Tachycardia, unspecified: Secondary | ICD-10-CM | POA: Diagnosis not present

## 2017-11-28 DIAGNOSIS — A419 Sepsis, unspecified organism: Secondary | ICD-10-CM | POA: Diagnosis not present

## 2017-11-29 DIAGNOSIS — J9611 Chronic respiratory failure with hypoxia: Secondary | ICD-10-CM | POA: Diagnosis not present

## 2017-11-29 DIAGNOSIS — Z452 Encounter for adjustment and management of vascular access device: Secondary | ICD-10-CM | POA: Diagnosis not present

## 2017-11-29 DIAGNOSIS — I2510 Atherosclerotic heart disease of native coronary artery without angina pectoris: Secondary | ICD-10-CM | POA: Diagnosis not present

## 2017-11-29 DIAGNOSIS — Z8551 Personal history of malignant neoplasm of bladder: Secondary | ICD-10-CM | POA: Diagnosis not present

## 2017-11-29 DIAGNOSIS — R7881 Bacteremia: Secondary | ICD-10-CM | POA: Diagnosis not present

## 2017-11-29 DIAGNOSIS — M6281 Muscle weakness (generalized): Secondary | ICD-10-CM | POA: Diagnosis not present

## 2017-11-29 DIAGNOSIS — N400 Enlarged prostate without lower urinary tract symptoms: Secondary | ICD-10-CM | POA: Diagnosis not present

## 2017-11-29 DIAGNOSIS — A419 Sepsis, unspecified organism: Secondary | ICD-10-CM | POA: Diagnosis not present

## 2017-11-29 DIAGNOSIS — I10 Essential (primary) hypertension: Secondary | ICD-10-CM | POA: Diagnosis not present

## 2017-11-29 DIAGNOSIS — A4102 Sepsis due to Methicillin resistant Staphylococcus aureus: Secondary | ICD-10-CM | POA: Diagnosis not present

## 2017-11-29 DIAGNOSIS — Z5181 Encounter for therapeutic drug level monitoring: Secondary | ICD-10-CM | POA: Diagnosis not present

## 2017-11-29 DIAGNOSIS — J449 Chronic obstructive pulmonary disease, unspecified: Secondary | ICD-10-CM | POA: Diagnosis not present

## 2017-11-29 DIAGNOSIS — R000 Tachycardia, unspecified: Secondary | ICD-10-CM | POA: Diagnosis not present

## 2017-11-29 DIAGNOSIS — J439 Emphysema, unspecified: Secondary | ICD-10-CM | POA: Diagnosis not present

## 2017-11-29 DIAGNOSIS — I330 Acute and subacute infective endocarditis: Secondary | ICD-10-CM | POA: Diagnosis not present

## 2017-11-30 ENCOUNTER — Other Ambulatory Visit: Payer: Self-pay

## 2017-11-30 ENCOUNTER — Encounter (HOSPITAL_COMMUNITY): Payer: Self-pay | Admitting: Emergency Medicine

## 2017-11-30 ENCOUNTER — Emergency Department (HOSPITAL_COMMUNITY): Payer: Medicare HMO

## 2017-11-30 ENCOUNTER — Inpatient Hospital Stay (HOSPITAL_COMMUNITY)
Admission: EM | Admit: 2017-11-30 | Discharge: 2017-12-11 | DRG: 643 | Disposition: A | Discharge status: Skilled Nursing Facility | Payer: Medicare HMO | Attending: Family Medicine | Admitting: Family Medicine

## 2017-11-30 DIAGNOSIS — Z9981 Dependence on supplemental oxygen: Secondary | ICD-10-CM

## 2017-11-30 DIAGNOSIS — R05 Cough: Secondary | ICD-10-CM | POA: Diagnosis not present

## 2017-11-30 DIAGNOSIS — I330 Acute and subacute infective endocarditis: Secondary | ICD-10-CM | POA: Diagnosis present

## 2017-11-30 DIAGNOSIS — Z7951 Long term (current) use of inhaled steroids: Secondary | ICD-10-CM

## 2017-11-30 DIAGNOSIS — J438 Other emphysema: Secondary | ICD-10-CM | POA: Diagnosis not present

## 2017-11-30 DIAGNOSIS — I5032 Chronic diastolic (congestive) heart failure: Secondary | ICD-10-CM | POA: Diagnosis not present

## 2017-11-30 DIAGNOSIS — J9811 Atelectasis: Secondary | ICD-10-CM | POA: Diagnosis not present

## 2017-11-30 DIAGNOSIS — E222 Syndrome of inappropriate secretion of antidiuretic hormone: Secondary | ICD-10-CM | POA: Diagnosis not present

## 2017-11-30 DIAGNOSIS — Z7952 Long term (current) use of systemic steroids: Secondary | ICD-10-CM

## 2017-11-30 DIAGNOSIS — Z515 Encounter for palliative care: Secondary | ICD-10-CM

## 2017-11-30 DIAGNOSIS — I10 Essential (primary) hypertension: Secondary | ICD-10-CM | POA: Diagnosis present

## 2017-11-30 DIAGNOSIS — Z880 Allergy status to penicillin: Secondary | ICD-10-CM | POA: Diagnosis not present

## 2017-11-30 DIAGNOSIS — I5031 Acute diastolic (congestive) heart failure: Secondary | ICD-10-CM | POA: Diagnosis not present

## 2017-11-30 DIAGNOSIS — E876 Hypokalemia: Secondary | ICD-10-CM | POA: Diagnosis present

## 2017-11-30 DIAGNOSIS — Z955 Presence of coronary angioplasty implant and graft: Secondary | ICD-10-CM | POA: Diagnosis not present

## 2017-11-30 DIAGNOSIS — J439 Emphysema, unspecified: Secondary | ICD-10-CM | POA: Diagnosis not present

## 2017-11-30 DIAGNOSIS — T502X5A Adverse effect of carbonic-anhydrase inhibitors, benzothiadiazides and other diuretics, initial encounter: Secondary | ICD-10-CM | POA: Diagnosis not present

## 2017-11-30 DIAGNOSIS — R7881 Bacteremia: Secondary | ICD-10-CM | POA: Diagnosis not present

## 2017-11-30 DIAGNOSIS — Z7982 Long term (current) use of aspirin: Secondary | ICD-10-CM

## 2017-11-30 DIAGNOSIS — D649 Anemia, unspecified: Secondary | ICD-10-CM | POA: Diagnosis not present

## 2017-11-30 DIAGNOSIS — R338 Other retention of urine: Secondary | ICD-10-CM | POA: Diagnosis present

## 2017-11-30 DIAGNOSIS — R600 Localized edema: Secondary | ICD-10-CM | POA: Diagnosis not present

## 2017-11-30 DIAGNOSIS — I2510 Atherosclerotic heart disease of native coronary artery without angina pectoris: Secondary | ICD-10-CM | POA: Diagnosis present

## 2017-11-30 DIAGNOSIS — M6281 Muscle weakness (generalized): Secondary | ICD-10-CM | POA: Diagnosis not present

## 2017-11-30 DIAGNOSIS — J441 Chronic obstructive pulmonary disease with (acute) exacerbation: Secondary | ICD-10-CM | POA: Diagnosis not present

## 2017-11-30 DIAGNOSIS — Y92230 Patient room in hospital as the place of occurrence of the external cause: Secondary | ICD-10-CM | POA: Diagnosis not present

## 2017-11-30 DIAGNOSIS — Z8551 Personal history of malignant neoplasm of bladder: Secondary | ICD-10-CM | POA: Diagnosis not present

## 2017-11-30 DIAGNOSIS — Z66 Do not resuscitate: Secondary | ICD-10-CM | POA: Diagnosis present

## 2017-11-30 DIAGNOSIS — I5033 Acute on chronic diastolic (congestive) heart failure: Secondary | ICD-10-CM | POA: Diagnosis present

## 2017-11-30 DIAGNOSIS — E039 Hypothyroidism, unspecified: Secondary | ICD-10-CM | POA: Diagnosis present

## 2017-11-30 DIAGNOSIS — Z79899 Other long term (current) drug therapy: Secondary | ICD-10-CM

## 2017-11-30 DIAGNOSIS — R000 Tachycardia, unspecified: Secondary | ICD-10-CM | POA: Diagnosis not present

## 2017-11-30 DIAGNOSIS — R1312 Dysphagia, oropharyngeal phase: Secondary | ICD-10-CM | POA: Diagnosis not present

## 2017-11-30 DIAGNOSIS — R229 Localized swelling, mass and lump, unspecified: Secondary | ICD-10-CM | POA: Diagnosis not present

## 2017-11-30 DIAGNOSIS — J9611 Chronic respiratory failure with hypoxia: Secondary | ICD-10-CM | POA: Diagnosis not present

## 2017-11-30 DIAGNOSIS — Z7189 Other specified counseling: Secondary | ICD-10-CM

## 2017-11-30 DIAGNOSIS — N139 Obstructive and reflux uropathy, unspecified: Secondary | ICD-10-CM | POA: Diagnosis not present

## 2017-11-30 DIAGNOSIS — E8809 Other disorders of plasma-protein metabolism, not elsewhere classified: Secondary | ICD-10-CM | POA: Diagnosis present

## 2017-11-30 DIAGNOSIS — B9562 Methicillin resistant Staphylococcus aureus infection as the cause of diseases classified elsewhere: Secondary | ICD-10-CM | POA: Diagnosis present

## 2017-11-30 DIAGNOSIS — N401 Enlarged prostate with lower urinary tract symptoms: Secondary | ICD-10-CM | POA: Diagnosis not present

## 2017-11-30 DIAGNOSIS — R279 Unspecified lack of coordination: Secondary | ICD-10-CM | POA: Diagnosis not present

## 2017-11-30 DIAGNOSIS — R0602 Shortness of breath: Secondary | ICD-10-CM | POA: Diagnosis not present

## 2017-11-30 DIAGNOSIS — I110 Hypertensive heart disease with heart failure: Secondary | ICD-10-CM | POA: Diagnosis present

## 2017-11-30 DIAGNOSIS — Z79891 Long term (current) use of opiate analgesic: Secondary | ICD-10-CM

## 2017-11-30 DIAGNOSIS — J449 Chronic obstructive pulmonary disease, unspecified: Secondary | ICD-10-CM | POA: Diagnosis not present

## 2017-11-30 DIAGNOSIS — E8770 Fluid overload, unspecified: Secondary | ICD-10-CM | POA: Diagnosis not present

## 2017-11-30 DIAGNOSIS — Z87891 Personal history of nicotine dependence: Secondary | ICD-10-CM

## 2017-11-30 DIAGNOSIS — E871 Hypo-osmolality and hyponatremia: Secondary | ICD-10-CM | POA: Diagnosis present

## 2017-11-30 DIAGNOSIS — R609 Edema, unspecified: Secondary | ICD-10-CM

## 2017-11-30 DIAGNOSIS — K5900 Constipation, unspecified: Secondary | ICD-10-CM | POA: Diagnosis not present

## 2017-11-30 DIAGNOSIS — E440 Moderate protein-calorie malnutrition: Secondary | ICD-10-CM | POA: Diagnosis not present

## 2017-11-30 DIAGNOSIS — Z7401 Bed confinement status: Secondary | ICD-10-CM | POA: Diagnosis not present

## 2017-11-30 DIAGNOSIS — S22080D Wedge compression fracture of T11-T12 vertebra, subsequent encounter for fracture with routine healing: Secondary | ICD-10-CM | POA: Diagnosis not present

## 2017-11-30 DIAGNOSIS — Z8249 Family history of ischemic heart disease and other diseases of the circulatory system: Secondary | ICD-10-CM

## 2017-11-30 DIAGNOSIS — Z808 Family history of malignant neoplasm of other organs or systems: Secondary | ICD-10-CM

## 2017-11-30 LAB — CBC WITH DIFFERENTIAL/PLATELET
BASOS ABS: 0 10*3/uL (ref 0.0–0.1)
BASOS PCT: 0 %
EOS ABS: 0 10*3/uL (ref 0.0–0.7)
Eosinophils Relative: 0 %
HEMATOCRIT: 34.1 % — AB (ref 39.0–52.0)
Hemoglobin: 11.7 g/dL — ABNORMAL LOW (ref 13.0–17.0)
Lymphocytes Relative: 6 %
Lymphs Abs: 0.9 10*3/uL (ref 0.7–4.0)
MCH: 31.1 pg (ref 26.0–34.0)
MCHC: 34.3 g/dL (ref 30.0–36.0)
MCV: 90.7 fL (ref 78.0–100.0)
Monocytes Absolute: 0.3 10*3/uL (ref 0.1–1.0)
Monocytes Relative: 2 %
NEUTROS ABS: 13.6 10*3/uL — AB (ref 1.7–7.7)
Neutrophils Relative %: 92 %
Platelets: 308 10*3/uL (ref 150–400)
RBC: 3.76 MIL/uL — AB (ref 4.22–5.81)
RDW: 13.7 % (ref 11.5–15.5)
WBC: 14.8 10*3/uL — AB (ref 4.0–10.5)

## 2017-11-30 LAB — COMPREHENSIVE METABOLIC PANEL
ALK PHOS: 86 U/L (ref 38–126)
ALT: 31 U/L (ref 17–63)
ANION GAP: 15 (ref 5–15)
AST: 27 U/L (ref 15–41)
Albumin: 3.1 g/dL — ABNORMAL LOW (ref 3.5–5.0)
BUN: 20 mg/dL (ref 6–20)
CALCIUM: 8.2 mg/dL — AB (ref 8.9–10.3)
CO2: 19 mmol/L — ABNORMAL LOW (ref 22–32)
CREATININE: 0.55 mg/dL — AB (ref 0.61–1.24)
Chloride: 86 mmol/L — ABNORMAL LOW (ref 101–111)
Glucose, Bld: 98 mg/dL (ref 65–99)
Potassium: 3.8 mmol/L (ref 3.5–5.1)
Sodium: 120 mmol/L — ABNORMAL LOW (ref 135–145)
TOTAL PROTEIN: 5.9 g/dL — AB (ref 6.5–8.1)
Total Bilirubin: 1.5 mg/dL — ABNORMAL HIGH (ref 0.3–1.2)

## 2017-11-30 LAB — BASIC METABOLIC PANEL
Anion gap: 11 (ref 5–15)
BUN: 16 mg/dL (ref 6–20)
CO2: 19 mmol/L — ABNORMAL LOW (ref 22–32)
Calcium: 7.2 mg/dL — ABNORMAL LOW (ref 8.9–10.3)
Chloride: 94 mmol/L — ABNORMAL LOW (ref 101–111)
Creatinine, Ser: 0.51 mg/dL — ABNORMAL LOW (ref 0.61–1.24)
GFR calc Af Amer: >60 mL/min (ref >60)
GLUCOSE: 69 mg/dL (ref 65–99)
Potassium: 3.7 mmol/L (ref 3.5–5.1)
SODIUM: 124 mmol/L — AB (ref 135–145)

## 2017-11-30 LAB — URINALYSIS, ROUTINE W REFLEX MICROSCOPIC
BACTERIA UA: NONE SEEN
BILIRUBIN URINE: NEGATIVE
GLUCOSE, UA: NEGATIVE mg/dL
Ketones, ur: 20 mg/dL — AB
LEUKOCYTES UA: NEGATIVE
NITRITE: NEGATIVE
PROTEIN: NEGATIVE mg/dL
Specific Gravity, Urine: 1.03 (ref 1.005–1.030)
pH: 5 (ref 5.0–8.0)

## 2017-11-30 LAB — PROTIME-INR
INR: 1.07
PROTHROMBIN TIME: 13.8 s (ref 11.4–15.2)

## 2017-11-30 LAB — SODIUM, URINE, RANDOM: SODIUM UR: 87 mmol/L

## 2017-11-30 LAB — LACTIC ACID, PLASMA: Lactic Acid, Venous: 1.1 mmol/L (ref 0.5–1.9)

## 2017-11-30 MED ORDER — PRAVASTATIN SODIUM 10 MG PO TABS
20.0000 mg | ORAL_TABLET | Freq: Every day | ORAL | Status: DC
  Administered 2017-12-01 – 2017-12-10 (×10): 20 mg via ORAL
  Filled 2017-11-30 (×10): qty 2

## 2017-11-30 MED ORDER — ENOXAPARIN SODIUM 40 MG/0.4ML ~~LOC~~ SOLN
40.0000 mg | SUBCUTANEOUS | Status: DC
  Administered 2017-12-01 – 2017-12-10 (×11): 40 mg via SUBCUTANEOUS
  Filled 2017-11-30 (×11): qty 0.4

## 2017-11-30 MED ORDER — DEXTROSE 5 % IV SOLN
2.0000 g | Freq: Once | INTRAVENOUS | Status: DC
Start: 2017-11-30 — End: 2017-11-30

## 2017-11-30 MED ORDER — MOMETASONE FURO-FORMOTEROL FUM 200-5 MCG/ACT IN AERO
2.0000 | INHALATION_SPRAY | Freq: Two times a day (BID) | RESPIRATORY_TRACT | Status: DC
  Administered 2017-11-30 – 2017-12-11 (×22): 2 via RESPIRATORY_TRACT
  Filled 2017-11-30 (×2): qty 8.8

## 2017-11-30 MED ORDER — IOPAMIDOL (ISOVUE-370) INJECTION 76%
150.0000 mL | Freq: Once | INTRAVENOUS | Status: AC | PRN
  Administered 2017-11-30: 100 mL via INTRAVENOUS

## 2017-11-30 MED ORDER — IPRATROPIUM-ALBUTEROL 0.5-2.5 (3) MG/3ML IN SOLN
3.0000 mL | Freq: Once | RESPIRATORY_TRACT | Status: AC
  Administered 2017-12-01: 3 mL via RESPIRATORY_TRACT
  Filled 2017-11-30: qty 3

## 2017-11-30 MED ORDER — PROMETHAZINE HCL 12.5 MG PO TABS
12.5000 mg | ORAL_TABLET | Freq: Four times a day (QID) | ORAL | Status: DC | PRN
  Administered 2017-12-05: 12.5 mg via ORAL
  Filled 2017-11-30: qty 1

## 2017-11-30 MED ORDER — HYDROCODONE-ACETAMINOPHEN 5-325 MG PO TABS: 1.0000 | ORAL_TABLET | Freq: Four times a day (QID) | ORAL | Status: DC | PRN

## 2017-11-30 MED ORDER — IPRATROPIUM BROMIDE 0.02 % IN SOLN
0.5000 mg | Freq: Four times a day (QID) | RESPIRATORY_TRACT | Status: DC
Start: 2017-11-30 — End: 2017-12-02
  Administered 2017-11-30 – 2017-12-02 (×6): 0.5 mg via RESPIRATORY_TRACT
  Filled 2017-11-30 (×6): qty 2.5

## 2017-11-30 MED ORDER — ACETAMINOPHEN 325 MG PO TABS: 650.0000 mg | ORAL_TABLET | Freq: Four times a day (QID) | ORAL | Status: DC | PRN

## 2017-11-30 MED ORDER — VANCOMYCIN HCL 10 G IV SOLR
2000.0000 mg | Freq: Once | INTRAVENOUS | Status: AC
  Administered 2017-11-30: 2000 mg via INTRAVENOUS
  Filled 2017-11-30: qty 2000

## 2017-11-30 MED ORDER — LEVOTHYROXINE SODIUM 88 MCG PO TABS
88.0000 ug | ORAL_TABLET | Freq: Every day | ORAL | Status: DC
  Administered 2017-12-01 – 2017-12-11 (×11): 88 ug via ORAL
  Filled 2017-11-30 (×11): qty 1

## 2017-11-30 MED ORDER — VANCOMYCIN HCL IN DEXTROSE 1-5 GM/200ML-% IV SOLN: 1000.0000 mg | Freq: Once | INTRAVENOUS | Status: DC

## 2017-11-30 MED ORDER — ACETAMINOPHEN 650 MG RE SUPP: 650.0000 mg | Freq: Four times a day (QID) | RECTAL | Status: DC | PRN

## 2017-11-30 MED ORDER — VANCOMYCIN HCL IN DEXTROSE 1-5 GM/200ML-% IV SOLN
1000.0000 mg | Freq: Two times a day (BID) | INTRAVENOUS | Status: DC
  Administered 2017-12-01 – 2017-12-10 (×19): 1000 mg via INTRAVENOUS
  Filled 2017-11-30 (×19): qty 200

## 2017-11-30 MED ORDER — TAMSULOSIN HCL 0.4 MG PO CAPS
0.4000 mg | ORAL_CAPSULE | Freq: Every day | ORAL | Status: DC
  Administered 2017-11-30 – 2017-12-10 (×11): 0.4 mg via ORAL
  Filled 2017-11-30 (×11): qty 1

## 2017-11-30 MED ORDER — SODIUM CHLORIDE 0.9 % IV BOLUS (SEPSIS)
1000.0000 mL | Freq: Once | INTRAVENOUS | Status: AC
  Administered 2017-11-30: 1000 mL via INTRAVENOUS

## 2017-11-30 MED ORDER — TIOTROPIUM BROMIDE MONOHYDRATE 18 MCG IN CAPS
18.0000 ug | ORAL_CAPSULE | Freq: Every day | RESPIRATORY_TRACT | Status: DC
  Filled 2017-11-30: qty 5

## 2017-11-30 MED ORDER — POTASSIUM CHLORIDE IN NACL 20-0.9 MEQ/L-% IV SOLN
INTRAVENOUS | Status: DC
  Administered 2017-11-30: 18:00:00 via INTRAVENOUS

## 2017-11-30 MED ORDER — LEVALBUTEROL HCL 0.63 MG/3ML IN NEBU
0.6300 mg | INHALATION_SOLUTION | Freq: Four times a day (QID) | RESPIRATORY_TRACT | Status: DC
  Administered 2017-11-30 – 2017-12-02 (×6): 0.63 mg via RESPIRATORY_TRACT
  Filled 2017-11-30 (×6): qty 3

## 2017-11-30 NOTE — ED Notes (Signed)
SW [employer] spoke with son as requested.

## 2017-11-30 NOTE — Progress Notes (Addendum)
Pharmacy Note:  Initial antibiotic(s) regimen of Vancomycin ordered by EDP to treat MRSA Bacteremia.  Estimated Creatinine Clearance: 62.4 mL/min (A) (by C-G formula based on SCr of 0.55 mg/dL (L)).   Allergies  Allergen Reactions  . Penicillins Rash    Has patient had a PCN reaction causing immediate rash, facial/tongue/throat swelling, SOB or lightheadedness with hypotension: Yes Has patient had a PCN reaction causing severe rash involving mucus membranes or skin necrosis: No Has patient had a PCN reaction that required hospitalization: No Has patient had a PCN reaction occurring within the last 10 years: No If all of the above answers are "NO", then may proceed with Cephalosporin use.     Vitals:   11/30/17 1345 11/30/17 1400  BP: (!) 81/56 (!) 87/61  Pulse: 81 80  Resp: 16 15  Temp:    SpO2: 98% 98%    Anti-infectives (From admission, onward)   Start     Dose/Rate Route Frequency Ordered Stop   11/30/17 1430  vancomycin (VANCOCIN) 2,000 mg in sodium chloride 0.9 % 500 mL IVPB     2,000 mg 250 mL/hr over 120 Minutes Intravenous  Once 11/30/17 1403     11/30/17 1345  vancomycin (VANCOCIN) IVPB 1000 mg/200 mL premix  Status:  Discontinued     1,000 mg 200 mL/hr over 60 Minutes Intravenous  Once 11/30/17 1335 11/30/17 1345      Plan: Initial dose(s) of Vancomycin X 1 ordered. F/U admission orders for further dosing if therapy continued.  Pricilla Larsson, [facility] 11/30/2017 2:11 PM   Addendum: Continue Vancomycin 1gm IV q12h F/U cxs and clinical progress Monitor V/S, labs, and levels as indicated  Isac Sarna, BS [place], BCPS Clinical Pharmacist Pager [phone]

## 2017-11-30 NOTE — Care Management (Signed)
Called to room to speak with patient's son. He is upset and aggressive in conversation. He wants to know why patient was not sent to rehab last visit.  CM reviewed notes and explained that patient was seen by PT and recommended for Out patient PT. He was sent home with [facility] RN for IV antibiotics, therefore [facility] PT was ordered also. In addition, patient wanted to go home.  Son reports "well, I wanted him to come home too." Son reports that [facility] RN states that patient is very "contagious" and wants to know my opinion. CM referred son to talk with MD about this. To which, he states "oh well everyone is giving me a different answer, I just wanted to hear what you have to say. But I guess you are too scared to answer."  We discuss if patient is admitted this visit, that another PT eval would be ordered and if recommended for rehab, patient could certainly be placed. Patient would have to be agreeable also. Patient could also go to rehab for IV antibiotics but again patient would have to be agreeable.  Son seemed to calm down and did apologize for his behavior.

## 2017-11-30 NOTE — ED Provider Notes (Signed)
[facility] EMERGENCY DEPARTMENT Provider Note   CSN: [id number] Arrival date & time: 11/30/17  1217  History   Chief Complaint Chief Complaint  Patient presents with  . Tachycardia    HPI Willie Williamson is a 84 y.o. male with recent hospitalization for sepsis, discharged with IV vancomycin for a prolonged course to treat MRSA bacteremia and infective endocarditis. Patient presents today from PCP with tachycardia and decreased urinary output. Patient was discharged on 1/3, and on 1/7 he was noted to have tachycardia and shortness of breath at home per family member at bedside. Over the week he has had decreasing urine output, which has been measured carefully by the family. He had only 200 ml output yesterday per their records, despite drinking fluids throughout the day.    Past Medical History:  Diagnosis Date  . Anemia   . CAD (coronary artery disease)    a. 03/2014 - s/p DES to RCA, nonobstructive disease in LAD/Cx, EF 60%.  . Cataract, congenital    "left eye"  . COPD (chronic obstructive pulmonary disease) with emphysema ([place])    a. Chronic resp failure on home O2 (2L).  . H/O hiatal hernia   . HTN (hypertension)   . Hyperlipidemia   . Hypothyroidism   . Malignant neoplasm of bladder, part unspecified    "it was cured"    Patient Active Problem List   Diagnosis Date Noted  . 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 Lumbar compression fracture, sequela 11/22/2017  . BPH (benign prostatic hyperplasia) 11/22/2017  . Weakness generalized 11/22/2017  . MRSA bacteremia 11/17/2017  . Tachycardia 11/16/2017  . Dyspnea 08/08/2017  . Chronic respiratory failure with hypoxia ([place]) 11/09/2015  . Preoperative clearance 08/11/2015  . Unstable angina ([place]) 04/17/2014  . Nonspecific abnormal unspecified cardiovascular function study 04/16/2014  . TINEA CORPORIS 04/20/2010  . Essential hypertension 09/06/2009  . BLADDER CANCER 01/10/2008  . HYPERLIPIDEMIA 01/10/2008  . Coronary  atherosclerosis 01/10/2008  . EMPHYSEMA 10/10/2007  . COPD with emphysema ([place]) 10/10/2007    Past Surgical History:  Procedure Laterality Date  . BLADDER SURGERY  ` 2006   "bladder polyps"  . CARDIAC CATHETERIZATION  2008  . CORONARY ANGIOPLASTY WITH STENT PLACEMENT  04/16/2014   "1"  . LEFT HEART CATHETERIZATION WITH CORONARY ANGIOGRAM N/A 04/16/2014   Procedure: LEFT HEART CATHETERIZATION WITH CORONARY ANGIOGRAM;  Surgeon: [name], MD;  Location: [facility] C[facility] LAB;  Service: Cardiovascular;  Laterality: N/A;  . PERCUTANEOUS CORONARY STENT INTERVENTION (PCI-S)  04/16/2014   Procedure: PERCUTANEOUS CORONARY STENT INTERVENTION (PCI-S);  Surgeon: [name], MD;  Location: [facility] CATH LAB;  Service: Cardiovascular;;  . TONSILLECTOMY    . TURP VAPORIZATION         Home Medications    Prior to Admission medications   Medication Sig Start Date End Date Taking? Authorizing Provider  albuterol (PROAIR HFA) 108 (90 Base) MCG/ACT inhaler INHALE 2 PUFFS INTO THE LUNGS EVERY 4 (FOUR) HOURS AS NEEDED FOR WHEEZING OR SHORTNESS OF BREATH. 01/30/17  Yes Javier Glazier, MD  aspirin 81 MG tablet Take 81 mg by mouth daily.     Yes [provider]  budesonide-formoterol (SYMBICORT) 160-4.5 MCG/ACT inhaler Inhale 2 puffs into the lungs 2 (two) times daily. 01/30/17  Yes Javier Glazier, MD  carvedilol (COREG) 3.125 MG tablet Take 2 tablets (6.25 mg total) by mouth 2 (two) times daily with a meal. 11/22/17  Yes Dhungel, Nishant, MD  fluticasone (FLONASE) 50 MCG/ACT nasal spray PLACE 1  SPRAY INTO BOTH NOSTRILS 2 (TWO) TIMES DAILY AS NEEDED. 07/10/16  Yes Javier Glazier, MD  GuaiFENesin (MUCINEX PO) Take 400 mg by mouth 3 (three) times daily as needed (for congestion).    Yes [provider]  HYDROcodone-acetaminophen (NORCO/VICODIN) 5-325 MG tablet Take 1 tablet by mouth every 6 (six) hours as needed for moderate pain or severe pain. 11/22/17  Yes Dhungel, Nishant, MD    levothyroxine (SYNTHROID, LEVOTHROID) 88 MCG tablet Take 88 mcg by mouth daily before breakfast.   Yes [provider]  lovastatin (MEVACOR) 20 MG tablet Take 20 mg by mouth at bedtime.     Yes [provider]  nitroGLYCERIN (NITROSTAT) 0.4 MG SL tablet Place 1 tablet (0.4 mg total) under the tongue every 5 (five) minutes as needed for chest pain (up to 3 doses). 12/12/16  Yes [name], MD  predniSONE (DELTASONE) 10 MG tablet Take 15 mg by mouth daily.  10/29/15  Yes [provider]  tamsulosin (FLOMAX) 0.4 MG CAPS capsule Take 1 capsule (0.4 mg total) by mouth daily after supper. 11/22/17  Yes Dhungel, Nishant, MD  tiotropium (SPIRIVA HANDIHALER) 18 MCG inhalation capsule PLACE 1 CAPSULE (18 MCG TOTAL) INTO INHALER AND INHALE DAILY. 01/30/17  Yes Javier Glazier, MD  vancomycin IVPB Inject 1,000 mg into the vein every 12 (twelve) hours. Indication:  MRSA Bacteremia Last Day of Therapy:  12/30/2017 Labs - Sunday/Monday:  CBC/D, BMP, and vancomycin trough. Labs - Thursday:  BMP and vancomycin trough Labs - Every other week:  ESR and CRP Patient taking differently: Inject 2,000 mg into the vein daily. Indication:  MRSA Bacteremia Last Day of Therapy:  12/30/2017 Labs - Sunday/Monday:  CBC/D, BMP, and vancomycin trough. Labs - Thursday:  BMP and vancomycin trough Labs - Every other week:  ESR and CRP 11/22/17  Yes Dhungel, Nishant, MD  vancomycin (VANCOCIN) 1-5 GM/200ML-% SOLN Inject 200 mLs (1,000 mg total) into the vein every 12 (twelve) hours. Patient not taking: Reported on 11/30/2017 11/22/17 12/30/17  Louellen Molder, MD    Family History Family History  Problem Relation Age of Onset  . Bone cancer Mother   . Hypertension Father   . Lung disease Neg Hx     Social History Social History   Tobacco Use  . Smoking status: Former Smoker    Packs/day: 1.50    Years: 48.00    Pack years: 72.00    Types: Cigarettes    Start date: 01/03/1949    Last  attempt to quit: 11/20/1996    Years since quitting: 21.0  . Smokeless tobacco: Never Used  Substance Use Topics  . Alcohol use: No    Alcohol/week: 0.0 oz  . Drug use: No     Allergies   Penicillins   Review of Systems Review of Systems  Constitutional: Positive for diaphoresis. Negative for fever.  HENT: Negative for congestion, rhinorrhea and sore throat.   Respiratory: Negative for chest tightness and wheezing.   Cardiovascular: Negative for chest pain.  Gastrointestinal: Negative for abdominal distention, constipation, diarrhea, nausea and vomiting.  Genitourinary: Negative for dysuria, frequency and urgency.  Musculoskeletal: Negative for neck stiffness.  Psychiatric/Behavioral: Negative for confusion.     Physical Exam Updated Vital Signs BP 98/63   Pulse 78   Temp 98 F (36.7 C) (Rectal)   Resp 14   SpO2 94%   Physical Exam  Constitutional: He is oriented to person, place, and time. He appears well-developed and well-nourished.  HENT:  Head: Normocephalic and atraumatic.  Mouth/Throat: No oropharyngeal exudate.  Mucous membranes dry  Eyes: Conjunctivae are normal.  Neck: Normal range of motion.  Cardiovascular: Regular rhythm. Exam reveals no gallop and no friction rub.  No murmur heard. +tachycardia  Pulmonary/Chest: He has no wheezes. He has no rales. He exhibits no tenderness.  +mildly increased work of breathing, +tachypnea  Abdominal: Soft. He exhibits no distension. There is no tenderness. There is no rebound and no guarding.  Musculoskeletal: He exhibits no edema.  Lymphadenopathy:    He has no cervical adenopathy.  Neurological: He is alert and oriented to person, place, and time.  Skin: Skin is warm. Capillary refill takes 2 to 3 seconds. No rash noted. He is diaphoretic.     ED Treatments / Results  Labs (all labs ordered are listed, but only abnormal results are displayed) Labs Reviewed  COMPREHENSIVE METABOLIC PANEL - Abnormal; Notable  for the following components:      Result Value   Sodium 120 (*)    Chloride 86 (*)    CO2 19 (*)    Creatinine, Ser 0.55 (*)    Calcium 8.2 (*)    Total Protein 5.9 (*)    Albumin 3.1 (*)    Total Bilirubin 1.5 (*)    All other components within normal limits  CBC WITH DIFFERENTIAL/PLATELET - Abnormal; Notable for the following components:   WBC 14.8 (*)    RBC 3.76 (*)    Hemoglobin 11.7 (*)    HCT 34.1 (*)    Neutro Abs 13.6 (*)    All other components within normal limits  CULTURE, BLOOD (ROUTINE X 2)  CULTURE, BLOOD (ROUTINE X 2)  PROTIME-INR  LACTIC ACID, PLASMA  URINALYSIS, ROUTINE W REFLEX MICROSCOPIC    EKG  EKG Interpretation  Date/Time:  Friday November 30 2017 12:47:25 EST Ventricular Rate:  93 PR Interval:    QRS Duration: 98 QT Interval:  387 QTC Calculation: 482 R Axis:   -87 Text Interpretation:  Sinus rhythm Abnormal R-wave progression, early transition Inferior infarct, old Confirmed by Elnora Morrison [phone]) on 11/30/2017 12:55:17 PM       Radiology Ct Angio Chest Pe W And/or Wo Contrast  Result Date: 11/30/2017 CLINICAL DATA:  Tachypnea, history coronary artery disease, COPD, hypertension, bladder cancer EXAM: CT ANGIOGRAPHY CHEST WITH CONTRAST TECHNIQUE: Multidetector CT imaging of the chest was performed using the standard protocol during bolus administration of intravenous contrast. Multiplanar CT image reconstructions and MIPs were obtained to evaluate the vascular anatomy. CONTRAST:  155m ISOVUE-370 IOPAMIDOL (ISOVUE-370) INJECTION 76% IV COMPARISON:  11/16/2017 FINDINGS: Cardiovascular: Atherosclerotic calcifications aorta, proximal great vessels and coronary arteries. Aorta normal caliber without aneurysm or dissection. Small pericardial effusion. Pulmonary arteries well opacified and patent. No evidence of pulmonary embolism. Narrowing at origin of celiac artery. Mediastinum/Nodes: Esophagus unremarkable. No thoracic adenopathy. Base of cervical  region normal appearance. Lungs/Pleura: Emphysematous changes with mild central peribronchial thickening. Subsegmental atelectasis RIGHT lower lobe and minimally in lingula. No acute infiltrate, pleural effusion, or pneumothorax. Upper Abdomen: Small nonobstructing calculus lower pole RIGHT kidney. Remaining visualized upper abdomen unremarkable Musculoskeletal: Bones demineralized. Compression fractures of multiple vertebral bodies including T5, T6, T12 and superior endplate L2. Review of the MIP images confirms the above findings. IMPRESSION: No evidence of pulmonary embolism. Small pericardial effusion. COPD changes with subsegmental atelectasis in RIGHT lower lobe and minimally in lingula. Multiple thoracolumbar compression fractures. Aortic Atherosclerosis (ICD10-I70.0) and Emphysema (ICD10-J43.9). Electronically Signed   By: MCrist InfanteD.  On: 11/30/2017 15:07   Dg Chest Port 1 View  Result Date: 11/30/2017 CLINICAL DATA:  Shortness of Breath EXAM: PORTABLE CHEST 1 VIEW COMPARISON:  Chest radiograph November 16, 2017 and chest CT November 16, 2017 FINDINGS: There is scarring in the lateral right base. There is no edema or consolidation. The heart size and pulmonary vascularity are normal. No adenopathy. No evident bone lesions. IMPRESSION: Scarring lateral right base. No edema or consolidation. Stable cardiac silhouette. Electronically Signed   By: Lowella Grip III M.D.   On: 11/30/2017 12:58    Procedures Procedures (including critical care time)  Medications Ordered in ED Medications  vancomycin (VANCOCIN) 2,000 mg in sodium chloride 0.9 % 500 mL IVPB (2,000 mg Intravenous New Bag/Given 11/30/17 1423)  sodium chloride 0.9 % bolus 1,000 mL (not administered)  aztreonam (AZACTAM) 2 g in dextrose 5 % 50 mL IVPB (not administered)  sodium chloride 0.9 % bolus 1,000 mL (0 mLs Intravenous Paused 11/30/17 1437)  iopamidol (ISOVUE-370) 76 % injection 150 mL (100 mLs Intravenous Contrast Given  11/30/17 1445)     Initial Impression / Assessment and Plan / ED Course  I have reviewed the triage vital signs and the nursing notes.  Pertinent labs & imaging results that were available during my care of the patient were reviewed by me and considered in my medical decision making (see chart for details).   Differential includes sepsis vs. PE given recent hospitalization, however CTA negative for PE. Patient noted to be tachypnic, tachycardic and hypotensive responsive to fluids, with white count 14.8. Given this data, presentation most likely sepsis, possibly due to MRSA bacteremia because according to pharmacy patient has been subtherapeutic over the course of the past week.  CXR negative for evidence of acute PNA. Patient was given 1000 ml fluid x2 which meets 30 ml/kg for sepsis. BCx were drawn. Vancomycin was continued and after CTA, aztreonam added for broad spectrum coverage.   Patient was also found to be hyponatremic at 120, asymptomatic. Last sodium was WNL 9 days ago.   Hospitalist was called for admission for sepsis management and hyponatremia.   Final Clinical Impressions(s) / ED Diagnoses   Final diagnoses:  SOB (shortness of breath)    ED Discharge Orders    None       Everrett Coombe, MD 11/30/17 1621    Elnora Morrison, MD 11/30/17 1624

## 2017-11-30 NOTE — Progress Notes (Signed)
AC notified of need for [facility]

## 2017-11-30 NOTE — Plan of Care (Signed)
Pt progressing

## 2017-11-30 NOTE — H&P (Addendum)
History and Physical    Willie Williamson XBM:[id number] DOB: 07-03-1931 DOA: 11/30/2017  PCP: [name], MD   Patient coming from: HOME  Chief Complaint: Generalized weakness, poor urine output.  HPI: Willie Williamson is a 84 y.o. male with medical history significant for recent 6 day admission and discharge for MRSA bacteremia and endocarditis, 11/16/17- 11/22/17.,  Also history of COPD, Bladder cancer, BPH.   Patient was sent from PCPs office with complaints of poor urine output over the past few days, family has been charting it, noted gradual decline. Patient son reports patient had a total of 279m of urine output yesterday.   Patient and family also notes generalized weakness, shortness of breath on exertion since hospital discharge.  Patient's son states they cant adequately take care of his father, he has to work, his wife just had open abdominal surgery, she is not supposed to do any lifting. Patient reports poor p.o. intake since discharge, but drinking at least 44 ounces of water daily.  Patient reports shortness of breath now, with wheezing but no cough.  No fevers no chills, no diaphoresis, no vomiting nausea or malaise, no dysuria frequency, no headache or neck stiffness.  Patient tells me he earlier talked to case management RN, and was very rude on the phone and wanted to apologize for his behavior.   ED Course: Pulse 107, blood pressure soft 97/69, recorded 81/56 ?accuracy, UA could not be obtained.  Sodium low 120, WBC elevated at 14, lactic acid 1.1. Chest CT to rule out PE-negative for PE, mental atelectasis right lower lobe, multiple compression fractures- thoracolumbar. 2L bolus given in ED. patient's IV vancomycin was consult continued.  There was a concern for possible sepsis, so IV aztreonam was ordered.   Review of Systems:  CONSTITUTIONAL- No Fever, weightloss, night sweat SKIN- No Rash, colour changes or itching. HEAD- No Headache or dizziness. CARDIAC- No  Palpitations,  or chest pain. GI- No nausea, vomiting, diarrhoea, constipation, abd pain. NEUROLOGIC- No Numbness, syncope, seizures or burning. [facility] or [facility].  Past Medical History:  Diagnosis Date  . Anemia   . CAD (coronary artery disease)    a. 03/2014 - s/p DES to RCA, nonobstructive disease in LAD/Cx, EF 60%.  . Cataract, congenital    "left eye"  . COPD (chronic obstructive pulmonary disease) with emphysema ([place]    a. Chronic resp failure on home O2 (2L).  . H/O hiatal hernia   . HTN (hypertension)   . Hyperlipidemia   . Hypothyroidism   . Malignant neoplasm of bladder, part unspecified    "it was cured"     Past Surgical History:  Procedure Laterality Date  . BLADDER SURGERY  ` 2006   "bladder polyps"  . CARDIAC CATHETERIZATION  2008  . CORONARY ANGIOPLASTY WITH STENT PLACEMENT  04/16/2014   "1"  . LEFT HEART CATHETERIZATION WITH CORONARY ANGIOGRAM N/A 04/16/2014   Procedure: LEFT HEART CATHETERIZATION WITH CORONARY ANGIOGRAM;  Surgeon: JJettie Booze MD;  [facility];  Service: Cardiovascular;  Laterality: N/A;  . PERCUTANEOUS CORONARY STENT INTERVENTION (PCI-S)  04/16/2014   Procedure: PERCUTANEOUS CORONARY STENT INTERVENTION (PCI-S);  Surgeon: JJettie Booze MD;  [facility];  Service: Cardiovascular;;  . TONSILLECTOMY    . TURP VAPORIZATION       reports that he quit smoking about 21 years ago. His smoking use included cigarettes. He started smoking about 68 years ago. He has a 72.00 pack-year smoking history. he  has never used smokeless tobacco. He reports that he does not drink alcohol or use drugs.  Allergies  Allergen Reactions  . Penicillins Rash    Has patient had a PCN reaction causing immediate rash, facial/tongue/throat swelling, SOB or lightheadedness with hypotension: Yes Has patient had a PCN reaction causing severe rash involving mucus membranes or skin necrosis: No Has patient had a PCN reaction  that required hospitalization: No Has patient had a PCN reaction occurring within the last 10 years: No If all of the above answers are "NO", then may proceed with Cephalosporin use.     Family History  Problem Relation Age of Onset  . Bone cancer Mother   . Hypertension Father   . Lung disease Neg Hx    Prior to Admission medications   Medication Sig Start Date End Date Taking? Authorizing Provider  albuterol (PROAIR HFA) 108 (90 Base) MCG/ACT inhaler INHALE 2 PUFFS INTO THE LUNGS EVERY 4 (FOUR) HOURS AS NEEDED FOR WHEEZING OR SHORTNESS OF BREATH. 01/30/17  Yes Javier Glazier, MD  aspirin 81 MG tablet Take 81 mg by mouth daily.     Yes [provider]  budesonide-formoterol (SYMBICORT) 160-4.5 MCG/ACT inhaler Inhale 2 puffs into the lungs 2 (two) times daily. 01/30/17  Yes Javier Glazier, MD  carvedilol (COREG) 3.125 MG tablet Take 2 tablets (6.25 mg total) by mouth 2 (two) times daily with a meal. 11/22/17  Yes Dhungel, Nishant, MD  fluticasone (FLONASE) 50 MCG/ACT nasal spray PLACE 1 SPRAY INTO BOTH NOSTRILS 2 (TWO) TIMES DAILY AS NEEDED. 07/10/16  Yes Javier Glazier, MD  GuaiFENesin (MUCINEX PO) Take 400 mg by mouth 3 (three) times daily as needed (for congestion).    Yes [provider]  HYDROcodone-acetaminophen (NORCO/VICODIN) 5-325 MG tablet Take 1 tablet by mouth every 6 (six) hours as needed for moderate pain or severe pain. 11/22/17  Yes Dhungel, Nishant, MD  levothyroxine (SYNTHROID, LEVOTHROID) 88 MCG tablet Take 88 mcg by mouth daily before breakfast.   Yes [provider]  lovastatin (MEVACOR) 20 MG tablet Take 20 mg by mouth at bedtime.     Yes [provider]  nitroGLYCERIN (NITROSTAT) 0.4 MG SL tablet Place 1 tablet (0.4 mg total) under the tongue every 5 (five) minutes as needed for chest pain (up to 3 doses). 12/12/16  Yes [name], MD  predniSONE (DELTASONE) 10 MG tablet Take 15 mg by mouth daily.  10/29/15  Yes [provider]  tamsulosin (FLOMAX) 0.4 MG CAPS capsule Take 1 capsule (0.4 mg total) by mouth daily after supper. 11/22/17  Yes Dhungel, Nishant, MD  tiotropium (SPIRIVA HANDIHALER) 18 MCG inhalation capsule PLACE 1 CAPSULE (18 MCG TOTAL) INTO INHALER AND INHALE DAILY. 01/30/17  Yes Javier Glazier, MD  vancomycin IVPB Inject 1,000 mg into the vein every 12 (twelve) hours. Indication:  MRSA Bacteremia Last Day of Therapy:  12/30/2017 Labs - Sunday/Monday:  CBC/D, BMP, and vancomycin trough. Labs - Thursday:  BMP and vancomycin trough Labs - Every other week:  ESR and CRP Patient taking differently: Inject 2,000 mg into the vein daily. Indication:  MRSA Bacteremia Last Day of Therapy:  12/30/2017 Labs - Sunday/Monday:  CBC/D, BMP, and vancomycin trough. Labs - Thursday:  BMP and vancomycin trough Labs - Every other week:  ESR and CRP 11/22/17  Yes Dhungel, Nishant, MD  vancomycin (VANCOCIN) 1-5 GM/200ML-% SOLN Inject 200 mLs (1,000 mg total) into the vein every 12 (twelve) hours. Patient not taking: Reported  on 11/30/2017 11/22/17 12/30/17  Louellen Molder, MD    Physical Exam: Vitals:   11/30/17 1500 11/30/17 1515 11/30/17 1530 11/30/17 1545  BP: 136/80 111/73 100/64 98/63  Pulse: 97 (!) 117 89 78  Resp: (!) 28 (!) _0 Temp:      TempSrc:      SpO2: 96% 97% 96% 94%    Constitutional: NAD, in mild respiratory distress Vitals:   11/30/17 1500 11/30/17 1515 11/30/17 1530 11/30/17 1545  BP: 136/80 111/73 100/64 98/63  Pulse: 97 (!) 117 89 78  Resp: (!) 28 (!) _1 Temp:      TempSrc:      SpO2: 96% 97% 96% 94%   Eyes: PERRL, lids and conjunctivae normal ENMT: Mucous membranes are  Dry,  Posterior pharynx clear of any exudate or lesions  Neck: normal, supple, no masses, no thyromegaly Respiratory: Marked reduced but equal air entry bilaterally, minimal expiratory wheeze, no crackles Cardiovascular: Regular rate and rhythm, heart sounds difficult to appreciate, no extremity  edema. 2+ pedal pulses. No carotid bruits.  Abdomen: no tenderness, no masses palpated. No hepatosplenomegaly. Bowel sounds positive. skin Bruises from prior subcutaneous enoxaparin on admission. Musculoskeletal: no clubbing / cyanosis. No joint deformity upper and lower extremities. Good ROM, no contractures. Normal muscle tone.  Bilateral feet cold but has good DP pulses, palpated left, audible with bedside hand sonographic- right Skin:  no ulcers. No induration Neurologic: CN 2-12 grossly intact.Strength 5/5 in all 4.  Psychiatric: Normal judgment and insight. Alert and oriented x 3. Normal mood.   Labs on Admission: I have personally reviewed following labs and imaging studies  CBC: Recent Labs  Lab 11/30/17 1242  WBC 14.8*  NEUTROABS 13.6*  HGB 11.7*  HCT 34.1*  MCV 90.7  PLT 641   Basic Metabolic Panel: Recent Labs  Lab 11/30/17 1242  NA 120*  K 3.8  CL 86*  CO2 19*  GLUCOSE 98  BUN 20  CREATININE 0.55*  CALCIUM 8.2*   GFR: Estimated Creatinine Clearance: 62.4 mL/min (A) (by C-G formula based on SCr of 0.55 mg/dL (L)). Liver Function Tests: Recent Labs  Lab 11/30/17 1242  AST 27  ALT 31  ALKPHOS 86  BILITOT 1.5*  PROT 5.9*  ALBUMIN 3.1*   Coagulation Profile: Recent Labs  Lab 11/30/17 1242  INR 1.07   Urine analysis:    Component Value Date/Time   COLORURINE YELLOW 11/16/2017 1500   APPEARANCEUR CLEAR 11/16/2017 1500   LABSPEC 1.021 11/16/2017 1500   PHURINE 6.0 11/16/2017 1500   GLUCOSEU NEGATIVE 11/16/2017 1500   HGBUR NEGATIVE 11/16/2017 1500   BILIRUBINUR NEGATIVE 11/16/2017 1500   KETONESUR NEGATIVE 11/16/2017 1500   PROTEINUR NEGATIVE 11/16/2017 1500   UROBILINOGEN 0.2 03/13/2014 1310   NITRITE NEGATIVE 11/16/2017 1500   LEUKOCYTESUR NEGATIVE 11/16/2017 1500    Radiological Exams on Admission: Ct Angio Chest Pe W And/or Wo Contrast  Result Date: 11/30/2017 CLINICAL DATA:  Tachypnea, history coronary artery disease, COPD,  hypertension, bladder cancer EXAM: CT ANGIOGRAPHY CHEST WITH CONTRAST TECHNIQUE: Multidetector CT imaging of the chest was performed using the standard protocol during bolus administration of intravenous contrast. Multiplanar CT image reconstructions and MIPs were obtained to evaluate the vascular anatomy. CONTRAST:  13m ISOVUE-370 IOPAMIDOL (ISOVUE-370) INJECTION 76% IV COMPARISON:  11/16/2017 FINDINGS: Cardiovascular: Atherosclerotic calcifications aorta, proximal great vessels and coronary arteries. Aorta normal caliber without aneurysm or dissection. Small pericardial effusion. Pulmonary arteries well opacified and patent. No evidence of pulmonary embolism.  Narrowing at origin of celiac artery. Mediastinum/Nodes: Esophagus unremarkable. No thoracic adenopathy. Base of cervical region normal appearance. Lungs/Pleura: Emphysematous changes with mild central peribronchial thickening. Subsegmental atelectasis RIGHT lower lobe and minimally in lingula. No acute infiltrate, pleural effusion, or pneumothorax. Upper Abdomen: Small nonobstructing calculus lower pole RIGHT kidney. Remaining visualized upper abdomen unremarkable Musculoskeletal: Bones demineralized. Compression fractures of multiple vertebral bodies including T5, T6, T12 and superior endplate L2. Review of the MIP images confirms the above findings. IMPRESSION: No evidence of pulmonary embolism. Small pericardial effusion. COPD changes with subsegmental atelectasis in RIGHT lower lobe and minimally in lingula. Multiple thoracolumbar compression fractures. Aortic Atherosclerosis (ICD10-I70.0) and Emphysema (ICD10-J43.9). Electronically Signed   By: Lavonia Dana M.D.   On: 11/30/2017 15:07   Dg Chest Port 1 View  Result Date: 11/30/2017 CLINICAL DATA:  Shortness of Breath EXAM: PORTABLE CHEST 1 VIEW COMPARISON:  Chest radiograph November 16, 2017 and chest CT November 16, 2017 FINDINGS: There is scarring in the lateral right base. There is no edema or  consolidation. The heart size and pulmonary vascularity are normal. No adenopathy. No evident bone lesions. IMPRESSION: Scarring lateral right base. No edema or consolidation. Stable cardiac silhouette. Electronically Signed   By: Lowella Grip III M.D.   On: 11/30/2017 12:58    EKG: Independently reviewed. Ectopic atria tachycardia.  Assessment/Plan Principal Problem:   Hyponatremia Active Problems:   Essential hypertension   COPD with emphysema (HCC)   Chronic respiratory failure with hypoxia (HCC)   MRSA bacteremia   Infective endocarditis  Hypovolemic hyponatremia- Na- 120. Mild symptoms of generalized weakness.  Likely due to poor p.o. Intake, and fluids intake without solute.  Blood pressure systolic 50I-37C.  Improved with IV fluids. 2L given in ED. - Continue IV fluids normal saline+20KCL 100cc/hr X 12 hrs - Recheck sodium 120> 124 - BMP a.m - serum osmolality- add on - Urine osmolality - urine sodium  Generalized weakness-likely from deconditioning, poor p.o. intake and hyponatremia contributing. Family requesting rehab stay this time.  Apparently family and patient had declined discharge to SNF on last admission. -Social work consult for placement-order placed -Physical therapy evaluation  Leukocytosis- 14.8.  Patient with MRSA bacteremia and infective endocarditis.  - At this time will discontinue IV aztreonam ordered in ED, as tachypnea tachycardia likely due to dehydration and COPD broncho spasm.  Chest CTA-no pulmonary source. He has no lactic acidosis, no fevers. -CBC a.m. -Continue IV vancomycin per pharmacy to complete 6 weeks last dose 12/30/2016 -Follow-up UA -Will Order urine cultures  -Follow-up blood cultures drawn in ED  MRSA bacteremia, infective endocarditis- diagnosed 10/2017. To complete 6 weeks, Vanc. TTE 11/17/2017-echo dense structure that is mildly mobile that intermittently appears in the LVOT under the aortic valve but not attached to the valve.   TEE was not done as TTE already showed possible endocarditis. - Continue IV vancomycin per pharmacy to complete 6 weeks last dose 12/30/2016  Poor urine output-possibly acute retention and dehydration.  History of BPH, bladder cancer.  -Foley catheter , not successful, so coude ordered -1 L urine drained after coude cath, and patient had had >2L fluids, so poor urine output likley pre-renal. -Resume home Flomax  COPD- SOB, with increased cough or sputum. WBC- 14.  Chest CT negative for infiltrate. -Bronchodilators scheduled, levalbuterol if this will help with tachycardia -Resume home Dulera -Hold off on steroids at this time   DVT prophylaxis:  Lovenox Code Status: DNR Family Communication: Patient's son at bedside, Cabe, all questions  answered. Disposition Plan:  To be determined, will need SNF placement Consults called: None  Admission status: Inpt, tele   Bethena Roys MD [facility] Pager [phone]  If 11PM-7AM, please contact night-coverage [email or web address] Password [facility]  11/30/2017, 4:37 PM

## 2017-11-30 NOTE — ED Triage Notes (Signed)
Unable to void  Fast hr  Sent from physician  Has home health- they are supposed to come to give antibiotics for his MERSA  As well as here for Sepsis

## 2017-11-30 NOTE — ED Notes (Signed)
Patient transported to CT 

## 2017-12-01 DIAGNOSIS — J9611 Chronic respiratory failure with hypoxia: Secondary | ICD-10-CM

## 2017-12-01 DIAGNOSIS — I10 Essential (primary) hypertension: Secondary | ICD-10-CM

## 2017-12-01 LAB — BASIC METABOLIC PANEL
ANION GAP: 13 (ref 5–15)
BUN: 13 mg/dL (ref 6–20)
CALCIUM: 7.7 mg/dL — AB (ref 8.9–10.3)
CO2: 19 mmol/L — ABNORMAL LOW (ref 22–32)
CREATININE: 0.53 mg/dL — AB (ref 0.61–1.24)
Chloride: 94 mmol/L — ABNORMAL LOW (ref 101–111)
GFR calc non Af Amer: >60 mL/min (ref >60)
Glucose, Bld: 56 mg/dL — ABNORMAL LOW (ref 65–99)
Potassium: 3.7 mmol/L (ref 3.5–5.1)
SODIUM: 126 mmol/L — AB (ref 135–145)

## 2017-12-01 LAB — CBC
HCT: 31.6 % — ABNORMAL LOW (ref 39.0–52.0)
HEMOGLOBIN: 10.7 g/dL — AB (ref 13.0–17.0)
MCH: 31.3 pg (ref 26.0–34.0)
MCHC: 33.9 g/dL (ref 30.0–36.0)
MCV: 92.4 fL (ref 78.0–100.0)
PLATELETS: 263 10*3/uL (ref 150–400)
RBC: 3.42 MIL/uL — ABNORMAL LOW (ref 4.22–5.81)
RDW: 14.1 % (ref 11.5–15.5)
WBC: 11.6 10*3/uL — AB (ref 4.0–10.5)

## 2017-12-01 LAB — GLUCOSE, CAPILLARY
GLUCOSE-CAPILLARY: 82 mg/dL (ref 65–99)
GLUCOSE-CAPILLARY: 196 mg/dL — AB (ref 65–99)
Glucose-Capillary: 54 mg/dL — ABNORMAL LOW (ref 65–99)
Glucose-Capillary: 128 mg/dL — ABNORMAL HIGH (ref 65–99)
Glucose-Capillary: 164 mg/dL — ABNORMAL HIGH (ref 65–99)

## 2017-12-01 LAB — OSMOLALITY: Osmolality: 260 mOsm/kg — ABNORMAL LOW (ref 275–295)

## 2017-12-01 LAB — OSMOLALITY, URINE: Osmolality, Ur: 652 mOsm/kg (ref 300–900)

## 2017-12-01 MED ORDER — ENSURE ENLIVE PO LIQD
237.0000 mL | Freq: Two times a day (BID) | ORAL | Status: DC
  Administered 2017-12-01 – 2017-12-07 (×12): 237 mL via ORAL

## 2017-12-01 MED ORDER — GUAIFENESIN ER 600 MG PO TB12
1200.0000 mg | ORAL_TABLET | Freq: Two times a day (BID) | ORAL | Status: DC
  Administered 2017-12-01 – 2017-12-11 (×20): 1200 mg via ORAL
  Filled 2017-12-01 (×20): qty 2

## 2017-12-01 MED ORDER — FUROSEMIDE 10 MG/ML IJ SOLN
40.0000 mg | Freq: Once | INTRAMUSCULAR | Status: AC
  Administered 2017-12-01: 40 mg via INTRAVENOUS
  Filled 2017-12-01: qty 4

## 2017-12-01 MED ORDER — CARVEDILOL 3.125 MG PO TABS
6.2500 mg | ORAL_TABLET | Freq: Two times a day (BID) | ORAL | Status: DC
  Administered 2017-12-01 – 2017-12-11 (×20): 6.25 mg via ORAL
  Filled 2017-12-01 (×19): qty 2

## 2017-12-01 MED ORDER — METHYLPREDNISOLONE SODIUM SUCC 125 MG IJ SOLR
60.0000 mg | Freq: Two times a day (BID) | INTRAMUSCULAR | Status: DC
  Administered 2017-12-01 – 2017-12-04 (×6): 60 mg via INTRAVENOUS
  Filled 2017-12-01 (×6): qty 2

## 2017-12-01 NOTE — Progress Notes (Signed)
Pharmacy Antibiotic Note  Willie Williamson is a 84 y.o. male admitted on 11/30/2017 with bacteremia and endocarditis  Pharmacy has been consulted for Vancomycin dosing.  Plan: Vancomycin 2000mg  loading dose, then 1000mg  IV every 12 hours.  Goal trough 15-20 mcg/mL.  F/u cxs and clinical progress Monitor V/S, labs and levels as indicated  Height: 5\' 7"  (170.2 cm) Weight: 144 lb 6.4 oz (65.5 kg) IBW/kg (Calculated) : 66.1  Temp (24hrs), Avg:97.8 F (36.6 C), Min:97.5 F (36.4 C), Max:98 F (36.7 C)  Recent Labs  Lab 11/30/17 1242 11/30/17 1259 11/30/17 1912 12/01/17 0600  WBC 14.8*  --   --  11.6*  CREATININE 0.55*  --  0.51* 0.53*  LATICACIDVEN  --  1.1  --   --     Estimated Creatinine Clearance: 63.7 mL/min (A) (by C-G formula based on SCr of 0.53 mg/dL (L)).    Allergies  Allergen Reactions  . Penicillins Rash    Has patient had a PCN reaction causing immediate rash, facial/tongue/throat swelling, SOB or lightheadedness with hypotension: Yes Has patient had a PCN reaction causing severe rash involving mucus membranes or skin necrosis: No Has patient had a PCN reaction that required hospitalization: No Has patient had a PCN reaction occurring within the last 10 years: No If all of the above answers are "NO", then may proceed with Cephalosporin use.     Antimicrobials this admission: *12/28 Vancomycin>>OPAT Vancomycin for 6 weeks (Thru 12/30/2017) 1/11 Aztreonam 2gm x 1 dose Dose changes: 1/4 Vancomycin dose change to 1500mg  daily as outpt(previous therapeutic dose 1gm IV q12h) 1/11 per [facility] RN dose changed 2000mg  daily b/c level subtherapeutic(9.7) 1/11 Vancomycin 1gm IV q12h Microbiology: 1/11 Bcx: ngtd 1/11 UCx: pending 12/31 Repeat BCX: ngtd 12/30 Repeat BCx: MRSA 12/28 BCx: MRSAThank you for allowing pharmacy to be a part of this patient's care.  Isac Sarna, BS [place], [place] Clinical Pharmacist Pager [phone] 12/01/2017 11:23 AM

## 2017-12-01 NOTE — Progress Notes (Signed)
pts CBG was 54.  Pt given orange juice and soda and CBG up to 82.   Will continue to monitor.

## 2017-12-01 NOTE — Progress Notes (Signed)
Initial Nutrition Assessment  DOCUMENTATION CODES:  Not applicable  INTERVENTION:  Ensure Enlive po BID, each supplement provides 350 kcal and 20 grams of protein  NUTRITION DIAGNOSIS:  Inadequate oral intake related to poor appetite, acute illness(Recent MRSA infection/IV abx likely playing role) as evidenced by per patient/family report.  GOAL:  Patient will meet greater than or equal to 90% of their needs  MONITOR:  PO intake, Supplement acceptance, Labs, Weight trends, I & O's  REASON FOR ASSESSMENT:  Malnutrition Screening Tool    ASSESSMENT:  84 y/o male PMHx COPD, HTN/HLD, CAD and recent 6 day admission 12/28-1/3 due to bacteremia and endocarditis being discharged w/ IV abx. Presented to ED due to decreasing UOP despite drinking fluids as well as weakness, poor intake and DOE.  Worked up for hyponatremia, elevated WBC and admitted for management.   RD operating remotely on weekends. Per chart, patient presented with poor UOP, weakness and intake. He was just recently admitted for bacteremia/endocarditis. He has been on IV abx which likely has had an impact on the patients PO intake. Suspect this has led to decline.   He does not display any significant weight loss based on objective weight history. He was 143 lbs and 5 oz when admitted 12/28 and, after being rehydrated, is about the same weight now  There is no documented PO intake at this time. Will order supplements to provide extra kcal/pro, monitor PO intake and adjust interventions as needed  Physical Exam: Unable to conduct  Labs: Severe hyponatremia, BG: Running low 55-80, WBC: 11.6, Albumin:3.1,  Meds: IB abx, IVF  Recent Labs  Lab 11/30/17 1242 11/30/17 1912 12/01/17 0600  NA 120* 124* 126*  K 3.8 3.7 3.7  CL 86* 94* 94*  CO2 19* 19* 19*  BUN 20 16 13   CREATININE 0.55* 0.51* 0.53*  CALCIUM 8.2* 7.2* 7.7*  GLUCOSE 98 69 56*   NUTRITION - FOCUSED PHYSICAL EXAM: Unable to conduct  Diet Order:  Diet  Heart Room service appropriate? Yes; Fluid consistency: Thin  EDUCATION NEEDS:  No education needs have been identified at this time  Skin: Cellulitis to R toe, Skin tear & weeping to R arm  Last BM:  1/10  Height:  Ht Readings from Last 1 Encounters:  11/30/17 5\' 7"  (1.702 m)   Weight:  Wt Readings from Last 1 Encounters:  11/30/17 144 lb 6.4 oz (65.5 kg)   Wt Readings from Last 10 Encounters:  11/30/17 144 lb 6.4 oz (65.5 kg)  11/22/17 141 lb 8.6 oz (64.2 kg)  08/08/17 152 lb 6 oz (69.1 kg)  04/20/17 152 lb (68.9 kg)  12/12/16 153 lb (69.4 kg)  10/06/16 156 lb (70.8 kg)  06/14/16 151 lb 6.4 oz (68.7 kg)  03/01/16 144 lb (65.3 kg)  02/11/16 144 lb 3.2 oz (65.4 kg)  11/09/15 142 lb (64.4 kg)   Ideal Body Weight:  67.27 kg  BMI:  Body mass index is 22.62 kg/m.  Estimated Nutritional Needs:  Kcal:  1850-2050 (28-31 kcal/kg bw) Protein:  80-92 (1.2-1.4 g/kg bw) Fluid:  1.8-2 L fluid (20ml/kcal)  Burtis Junes RD, LDN, CNSC Clinical Nutrition Pager: [phone] 12/01/2017 9:59 AM

## 2017-12-01 NOTE — Progress Notes (Signed)
PROGRESS NOTE    Willie Williamson  WEX:[id number] DOB: 1931/10/22 DOA: 11/30/2017 PCP: [name], MD    Brief Narrative:  84 year old male who was recently discharged from the hospital after being treated for MRSA bacteremia, return to the hospital with generalized weakness and poor urine output.  He was found to have significant hyponatremia with a sodium of 120.  Blood pressure was noted to be in the lower side.  He was started on intravenous fluids.  He was continued on his home regimen of vancomycin.  Foley catheter was placed for possible urinary retention.  He is generally weak and will likely need skilled nursing facility placement.   Assessment & Plan:   Principal Problem:   Hyponatremia Active Problems:   Essential hypertension   COPD with emphysema (HCC)   Chronic respiratory failure with hypoxia (HCC)   MRSA bacteremia   Infective endocarditis   1. Hyponatremia.  Initially felt to be hypovolemic.  Serum sodium 120 on admission.  Started on IV fluids with improvement of sodium to 126.  At this time, he appears to be hypervolemic with evidence of peripheral edema.  Will hold further IV fluids and give 1 dose of Lasix. 2. Recent MRSA bacteremia with endocarditis.  Continue outpatient regimen of vancomycin.  He will complete his vancomycin course on 12/30/17 3. Chronic respiratory failure with steroid dependent COPD.  Chronically on 2 L of oxygen.  More recently, reports needing 3 L of oxygen.  On exam he is wheezing and short of breath.  We will continue bronchodilators.  Add IV steroids. 4. Hypertension.  Resume home dose of Coreg 5. Hypothyroidism.  Continue on Synthroid 6. Possible urinary retention.  Foley catheter placed in the emergency room.  Continue on Flomax.  He has a history of BPH.  Will attempt voiding trial prior to discharge. 7. Generalized weakness.  Will likely need skilled nursing facility placement.  Physical therapy consulted.   DVT prophylaxis:  Lovenox Code Status: DNR Family Communication: No family present Disposition Plan: We will likely need skilled nursing facility placement   Consultants:     Procedures:     Antimicrobials:   Vancomycin   Subjective: Feels more short of breath than normal.  Feels generally weak.  Objective: Vitals:   12/01/17 0558 12/01/17 0908 12/01/17 1300 12/01/17 1456  BP: 131/68  127/76   Pulse: 99  (!) 102   Resp: 16  18   Temp: (!) 97.5 F (36.4 C)  97.9 F (36.6 C)   TempSrc: Oral  Oral   SpO2: 100% 98% 99% 98%  Weight:      Height:        Intake/Output Summary (Last 24 hours) at 12/01/2017 1720 Last data filed at 12/01/2017 1601 Gross per 24 hour  Intake 2386.66 ml  Output 1300 ml  Net 1086.66 ml   Filed Weights   11/30/17 1810  Weight: 65.5 kg (144 lb 6.4 oz)    Examination:  General exam: Appears calm and comfortable  Respiratory system: Bilateral wheezing, diminished breath sounds.  Increased respiratory effort Cardiovascular system: S1 & S2 heard, RRR. No JVD, murmurs, rubs, gallops or clicks. 1+ pedal edema. Gastrointestinal system: Abdomen is nondistended, soft and nontender. No organomegaly or masses felt. Normal bowel sounds heard. Central nervous system: Alert and oriented. No focal neurological deficits. Extremities: Symmetric 5 x 5 power. Skin: No rashes, lesions or ulcers Psychiatry: Judgement and insight appear normal. Mood & affect appropriate.     Data Reviewed: I have  personally reviewed following labs and imaging studies  CBC: Recent Labs  Lab 11/30/17 1242 12/01/17 0600  WBC 14.8* 11.6*  NEUTROABS 13.6*  --   HGB 11.7* 10.7*  HCT 34.1* 31.6*  MCV 90.7 92.4  PLT 308 939   Basic Metabolic Panel: Recent Labs  Lab 11/30/17 1242 11/30/17 1912 12/01/17 0600  NA 120* 124* 126*  K 3.8 3.7 3.7  CL 86* 94* 94*  CO2 19* 19* 19*  GLUCOSE 98 69 56*  BUN 20 16 13   CREATININE 0.55* 0.51* 0.53*  CALCIUM 8.2* 7.2* 7.7*   GFR: Estimated  Creatinine Clearance: 63.7 mL/min (A) (by C-G formula based on SCr of 0.53 mg/dL (L)). Liver Function Tests: Recent Labs  Lab 11/30/17 1242  AST 27  ALT 31  ALKPHOS 86  BILITOT 1.5*  PROT 5.9*  ALBUMIN 3.1*   No results for input(s): LIPASE, AMYLASE in the last 168 hours. No results for input(s): AMMONIA in the last 168 hours. Coagulation Profile: Recent Labs  Lab 11/30/17 1242  INR 1.07   Cardiac Enzymes: No results for input(s): CKTOTAL, CKMB, CKMBINDEX, TROPONINI in the last 168 hours. BNP (last 3 results) No results for input(s): PROBNP in the last 8760 hours. HbA1C: No results for input(s): HGBA1C in the last 72 hours. CBG: Recent Labs  Lab 12/01/17 0835 12/01/17 0900 12/01/17 1138  GLUCAP 54* 82 128*   Lipid Profile: No results for input(s): CHOL, HDL, LDLCALC, TRIG, CHOLHDL, LDLDIRECT in the last 72 hours. Thyroid Function Tests: No results for input(s): TSH, T4TOTAL, FREET4, T3FREE, THYROIDAB in the last 72 hours. Anemia Panel: No results for input(s): VITAMINB12, FOLATE, FERRITIN, TIBC, IRON, RETICCTPCT in the last 72 hours. Sepsis Labs: Recent Labs  Lab 11/30/17 1259  LATICACIDVEN 1.1    Recent Results (from the past 240 hour(s))  Culture, blood (Routine x 2)     Status: None (Preliminary result)   Collection Time: 11/30/17 12:42 PM  Result Value Ref Range Status   Specimen Description BLOOD PICC LINE  Final   Special Requests   Final    BOTTLES DRAWN AEROBIC AND ANAEROBIC Blood Culture adequate volume   Culture NO GROWTH < 24 HOURS  Final   Report Status PENDING  Incomplete  Culture, blood (Routine x 2)     Status: None (Preliminary result)   Collection Time: 11/30/17 12:59 PM  Result Value Ref Range Status   Specimen Description BLOOD LEFT ARM  Final   Special Requests   Final    BOTTLES DRAWN AEROBIC AND ANAEROBIC Blood Culture adequate volume   Culture NO GROWTH < 24 HOURS  Final   Report Status PENDING  Incomplete         Radiology  Studies: Ct Angio Chest Pe W And/or Wo Contrast  Result Date: 11/30/2017 CLINICAL DATA:  Tachypnea, history coronary artery disease, COPD, hypertension, bladder cancer EXAM: CT ANGIOGRAPHY CHEST WITH CONTRAST TECHNIQUE: Multidetector CT imaging of the chest was performed using the standard protocol during bolus administration of intravenous contrast. Multiplanar CT image reconstructions and MIPs were obtained to evaluate the vascular anatomy. CONTRAST:  175mL ISOVUE-370 IOPAMIDOL (ISOVUE-370) INJECTION 76% IV COMPARISON:  11/16/2017 FINDINGS: Cardiovascular: Atherosclerotic calcifications aorta, proximal great vessels and coronary arteries. Aorta normal caliber without aneurysm or dissection. Small pericardial effusion. Pulmonary arteries well opacified and patent. No evidence of pulmonary embolism. Narrowing at origin of celiac artery. Mediastinum/Nodes: Esophagus unremarkable. No thoracic adenopathy. Base of cervical region normal appearance. Lungs/Pleura: Emphysematous changes with mild central peribronchial thickening. Subsegmental atelectasis  RIGHT lower lobe and minimally in lingula. No acute infiltrate, pleural effusion, or pneumothorax. Upper Abdomen: Small nonobstructing calculus lower pole RIGHT kidney. Remaining visualized upper abdomen unremarkable Musculoskeletal: Bones demineralized. Compression fractures of multiple vertebral bodies including T5, T6, T12 and superior endplate L2. Review of the MIP images confirms the above findings. IMPRESSION: No evidence of pulmonary embolism. Small pericardial effusion. COPD changes with subsegmental atelectasis in RIGHT lower lobe and minimally in lingula. Multiple thoracolumbar compression fractures. Aortic Atherosclerosis (ICD10-I70.0) and Emphysema (ICD10-J43.9). Electronically Signed   By: Lavonia Dana M.D.   On: 11/30/2017 15:07   Dg Chest Port 1 View  Result Date: 11/30/2017 CLINICAL DATA:  Shortness of Breath EXAM: PORTABLE CHEST 1 VIEW COMPARISON:   Chest radiograph November 16, 2017 and chest CT November 16, 2017 FINDINGS: There is scarring in the lateral right base. There is no edema or consolidation. The heart size and pulmonary vascularity are normal. No adenopathy. No evident bone lesions. IMPRESSION: Scarring lateral right base. No edema or consolidation. Stable cardiac silhouette. Electronically Signed   By: Lowella Grip III M.D.   On: 11/30/2017 12:58        Scheduled Meds: . enoxaparin (LOVENOX) injection  40 mg Subcutaneous Q24H  . feeding supplement (ENSURE ENLIVE)  237 mL Oral BID BM  . furosemide  40 mg Intravenous Once  . guaiFENesin  1,200 mg Oral BID  . ipratropium  0.5 mg Nebulization Q6H  . ipratropium-albuterol  3 mL Nebulization Once  . levalbuterol  0.63 mg Nebulization Q6H  . levothyroxine  88 mcg Oral QAC breakfast  . methylPREDNISolone (SOLU-MEDROL) injection  60 mg Intravenous Q12H  . mometasone-formoterol  2 puff Inhalation BID  . pravastatin  20 mg Oral q1800  . tamsulosin  0.4 mg Oral QPC supper   Continuous Infusions: . vancomycin Stopped (12/01/17 1456)     LOS: 1 day    Time spent: 74mins    Kathie Dike, MD [facility] Pager [phone]  If 7PM-7AM, please contact night-coverage [email or web address] Password [email or web address] 12/01/2017, 5:20 PM

## 2017-12-02 LAB — BASIC METABOLIC PANEL
ANION GAP: 11 (ref 5–15)
BUN: 12 mg/dL (ref 6–20)
CHLORIDE: 89 mmol/L — AB (ref 101–111)
CO2: 24 mmol/L (ref 22–32)
Calcium: 8.4 mg/dL — ABNORMAL LOW (ref 8.9–10.3)
Creatinine, Ser: 0.57 mg/dL — ABNORMAL LOW (ref 0.61–1.24)
GFR calc Af Amer: >60 mL/min (ref >60)
GLUCOSE: 232 mg/dL — AB (ref 65–99)
POTASSIUM: 3.7 mmol/L (ref 3.5–5.1)
SODIUM: 124 mmol/L — AB (ref 135–145)

## 2017-12-02 LAB — GLUCOSE, CAPILLARY
Glucose-Capillary: 202 mg/dL — ABNORMAL HIGH (ref 65–99)
Glucose-Capillary: 195 mg/dL — ABNORMAL HIGH (ref 65–99)
Glucose-Capillary: 200 mg/dL — ABNORMAL HIGH (ref 65–99)
Glucose-Capillary: 195 mg/dL — ABNORMAL HIGH (ref 65–99)

## 2017-12-02 LAB — URINE CULTURE: CULTURE: NO GROWTH

## 2017-12-02 LAB — CBC
HEMATOCRIT: 31.6 % — AB (ref 39.0–52.0)
HEMOGLOBIN: 11.1 g/dL — AB (ref 13.0–17.0)
MCH: 31.4 pg (ref 26.0–34.0)
MCHC: 35.1 g/dL (ref 30.0–36.0)
MCV: 89.5 fL (ref 78.0–100.0)
Platelets: 274 10*3/uL (ref 150–400)
RBC: 3.53 MIL/uL — ABNORMAL LOW (ref 4.22–5.81)
RDW: 13.5 % (ref 11.5–15.5)
WBC: 9.1 10*3/uL (ref 4.0–10.5)

## 2017-12-02 MED ORDER — LEVALBUTEROL HCL 0.63 MG/3ML IN NEBU
0.6300 mg | INHALATION_SOLUTION | Freq: Four times a day (QID) | RESPIRATORY_TRACT | Status: DC | PRN
  Administered 2017-12-02 – 2017-12-06 (×10): 0.63 mg via RESPIRATORY_TRACT
  Filled 2017-12-02 (×10): qty 3

## 2017-12-02 MED ORDER — FUROSEMIDE 20 MG PO TABS
20.0000 mg | ORAL_TABLET | Freq: Every day | ORAL | Status: DC
  Administered 2017-12-02 – 2017-12-03 (×2): 20 mg via ORAL
  Filled 2017-12-02 (×2): qty 1

## 2017-12-02 MED ORDER — IPRATROPIUM BROMIDE 0.02 % IN SOLN
0.5000 mg | Freq: Four times a day (QID) | RESPIRATORY_TRACT | Status: DC | PRN
  Administered 2017-12-02 – 2017-12-04 (×6): 0.5 mg via RESPIRATORY_TRACT
  Filled 2017-12-02 (×7): qty 2.5

## 2017-12-02 NOTE — Progress Notes (Signed)
Pt had a unsustained SVT run@ 140. vitals were taken and are as follows BP 140/62 HR 110 RR 22 94% ON 3L patient appears asymptomatic  for cardiac  distress . RN reported this finding to Dr Darrick Meigs Via amion. Dr Darrick Meigs did call back and have made changes to current medication regime. I will continue to monitor patient closely

## 2017-12-02 NOTE — Progress Notes (Signed)
PROGRESS NOTE    Willie Williamson  OZD:[id number] DOB: [birth year] DOA: 11/30/2017 PCP: [name], MD    Brief Narrative:  84 year old male who was recently discharged from the hospital after being treated for MRSA bacteremia, return to the hospital with generalized weakness and poor urine output.  He was found to have significant hyponatremia with a sodium of 120.  Blood pressure was noted to be in the lower side.  He was started on intravenous fluids.  He was continued on his home regimen of vancomycin.  Foley catheter was placed for possible urinary retention.  He is generally weak and will likely need skilled nursing facility placement.   Assessment & Plan:   Principal Problem:   Hyponatremia Active Problems:   Essential hypertension   COPD with emphysema (HCC)   Chronic respiratory failure with hypoxia (HCC)   MRSA bacteremia   Infective endocarditis   1. Hyponatremia.  Initially felt to be hypovolemic.  Serum sodium 120 on admission.  Started on IV fluids with improvement of sodium to 126.  At this time, he appears to be hypervolemic with evidence of peripheral edema.  He received another dose of IV fluids, but sodium is 124 today. No urine output recorded, although the patient does report good urine output. Will start on low dose lasix and fluid restriction. 2. Recent MRSA bacteremia with endocarditis.  Continue outpatient regimen of vancomycin.  He will complete his vancomycin course on 12/30/17 3. Chronic respiratory failure with steroid dependent COPD.  Chronically on 2 L of oxygen.  More recently, reports needing 3 L of oxygen.  On exam he is wheezing and short of breath.  Continue on solumedrol and neb treatments. 4. Hypertension.  Resume home dose of Coreg 5. Hypothyroidism.  Continue on Synthroid 6. Possible urinary retention.  Foley catheter placed in the emergency room.  Continue on Flomax.  He has a history of BPH.  Will attempt voiding trial prior to  discharge. 7. Generalized weakness.  Will likely need skilled nursing facility placement.  Physical therapy consulted.   DVT prophylaxis: Lovenox Code Status: DNR Family Communication: discussed with son over the phone Disposition Plan: Will likely need skilled nursing facility placement, PT eval requested.   Consultants:     Procedures:     Antimicrobials:   Vancomycin   Subjective: Still feels very weak and short of breath. Non productive cough  Objective: Vitals:   12/02/17 0500 12/02/17 0800 12/02/17 1414 12/02/17 1425  BP: 140/62  (!) 151/80   Pulse: (!) 123  (!) 102   Resp: 18  18   Temp: 98.1 F (36.7 C)  98.2 F (36.8 C)   TempSrc: Oral  Oral   SpO2: 98% 96% 99% 98%  Weight:      Height:        Intake/Output Summary (Last 24 hours) at 12/02/2017 1642 Last data filed at 12/02/2017 1412 Gross per 24 hour  Intake 2600 ml  Output 175 ml  Net 2425 ml   Filed Weights   11/30/17 1810  Weight: 65.5 kg (144 lb 6.4 oz)    Examination:  General exam: Appears calm and comfortable  Respiratory system: Bilateral wheezing, diminished breath sounds.  Increased respiratory effort Cardiovascular system: S1 & S2 heard, RRR. No JVD, murmurs, rubs, gallops or clicks. 1+ pedal edema, anasarca, scrotal edema. Gastrointestinal system: Abdomen is nondistended, soft and nontender. No organomegaly or masses felt. Normal bowel sounds heard. Central nervous system: Alert and oriented. No focal neurological deficits. Extremities:  Symmetric 5 x 5 power. Skin: No rashes, lesions or ulcers Psychiatry: Judgement and insight appear normal. Mood & affect appropriate.     Data Reviewed: I have personally reviewed following labs and imaging studies  CBC: Recent Labs  Lab 11/30/17 1242 12/01/17 0600 12/02/17 0555  WBC 14.8* 11.6* 9.1  NEUTROABS 13.6*  --   --   HGB 11.7* 10.7* 11.1*  HCT 34.1* 31.6* 31.6*  MCV 90.7 92.4 89.5  PLT 308 263 329   Basic Metabolic  Panel: Recent Labs  Lab 11/30/17 1242 11/30/17 1912 12/01/17 0600 12/02/17 0555  NA 120* 124* 126* 124*  K 3.8 3.7 3.7 3.7  CL 86* 94* 94* 89*  CO2 19* 19* 19* 24  GLUCOSE 98 69 56* 232*  BUN 20 16 13 12   CREATININE 0.55* 0.51* 0.53* 0.57*  CALCIUM 8.2* 7.2* 7.7* 8.4*   GFR: Estimated Creatinine Clearance: 63.7 mL/min (A) (by C-G formula based on SCr of 0.57 mg/dL (L)). Liver Function Tests: Recent Labs  Lab 11/30/17 1242  AST 27  ALT 31  ALKPHOS 86  BILITOT 1.5*  PROT 5.9*  ALBUMIN 3.1*   No results for input(s): LIPASE, AMYLASE in the last 168 hours. No results for input(s): AMMONIA in the last 168 hours. Coagulation Profile: Recent Labs  Lab 11/30/17 1242  INR 1.07   Cardiac Enzymes: No results for input(s): CKTOTAL, CKMB, CKMBINDEX, TROPONINI in the last 168 hours. BNP (last 3 results) No results for input(s): PROBNP in the last 8760 hours. HbA1C: No results for input(s): HGBA1C in the last 72 hours. CBG: Recent Labs  Lab 12/01/17 1725 12/01/17 2104 12/02/17 0735 12/02/17 1143 12/02/17 1634  GLUCAP 164* 196* 202* 195* 200*   Lipid Profile: No results for input(s): CHOL, HDL, LDLCALC, TRIG, CHOLHDL, LDLDIRECT in the last 72 hours. Thyroid Function Tests: No results for input(s): TSH, T4TOTAL, FREET4, T3FREE, THYROIDAB in the last 72 hours. Anemia Panel: No results for input(s): VITAMINB12, FOLATE, FERRITIN, TIBC, IRON, RETICCTPCT in the last 72 hours. Sepsis Labs: Recent Labs  Lab 11/30/17 1259  LATICACIDVEN 1.1    Recent Results (from the past 240 hour(s))  Culture, blood (Routine x 2)     Status: None (Preliminary result)   Collection Time: 11/30/17 12:42 PM  Result Value Ref Range Status   Specimen Description BLOOD PICC LINE  Final   Special Requests   Final    BOTTLES DRAWN AEROBIC AND ANAEROBIC Blood Culture adequate volume   Culture NO GROWTH 2 DAYS  Final   Report Status PENDING  Incomplete  Culture, blood (Routine x 2)     Status:  None (Preliminary result)   Collection Time: 11/30/17 12:59 PM  Result Value Ref Range Status   Specimen Description BLOOD LEFT ARM  Final   Special Requests   Final    BOTTLES DRAWN AEROBIC AND ANAEROBIC Blood Culture adequate volume   Culture NO GROWTH 2 DAYS  Final   Report Status PENDING  Incomplete  Culture, Urine     Status: None   Collection Time: 11/30/17  9:40 PM  Result Value Ref Range Status   Specimen Description URINE, CLEAN CATCH  Final   Special Requests NONE  Final   Culture   Final    NO GROWTH Performed at [facility], [place] [address], [place], [place] [address]    Report Status 12/02/2017 FINAL  Final         Radiology Studies: No results found.      Scheduled  Meds: . carvedilol  6.25 mg Oral BID WC  . enoxaparin (LOVENOX) injection  40 mg Subcutaneous Q24H  . feeding supplement (ENSURE ENLIVE)  237 mL Oral BID BM  . guaiFENesin  1,200 mg Oral BID  . levothyroxine  88 mcg Oral QAC breakfast  . methylPREDNISolone (SOLU-MEDROL) injection  60 mg Intravenous Q12H  . mometasone-formoterol  2 puff Inhalation BID  . pravastatin  20 mg Oral q1800  . tamsulosin  0.4 mg Oral QPC supper   Continuous Infusions: . vancomycin Stopped (12/02/17 1507)     LOS: 2 days    Time spent: 43mins    Kathie Dike, MD [facility] Pager [phone]  If 7PM-7AM, please contact night-coverage [email or web address] Password [facility] 12/02/2017, 4:42 PM

## 2017-12-02 NOTE — Progress Notes (Signed)
patient not give neb at this nebs are prn

## 2017-12-03 ENCOUNTER — Inpatient Hospital Stay (HOSPITAL_COMMUNITY): Payer: Medicare HMO

## 2017-12-03 LAB — COMPREHENSIVE METABOLIC PANEL
ALT: 21 U/L (ref 17–63)
AST: 19 U/L (ref 15–41)
Albumin: 2.8 g/dL — ABNORMAL LOW (ref 3.5–5.0)
Alkaline Phosphatase: 73 U/L (ref 38–126)
Anion gap: 11 (ref 5–15)
BILIRUBIN TOTAL: 1.1 mg/dL (ref 0.3–1.2)
BUN: 16 mg/dL (ref 6–20)
CO2: 24 mmol/L (ref 22–32)
Calcium: 8.6 mg/dL — ABNORMAL LOW (ref 8.9–10.3)
Chloride: 86 mmol/L — ABNORMAL LOW (ref 101–111)
Creatinine, Ser: 0.49 mg/dL — ABNORMAL LOW (ref 0.61–1.24)
Glucose, Bld: 156 mg/dL — ABNORMAL HIGH (ref 65–99)
POTASSIUM: 3.9 mmol/L (ref 3.5–5.1)
Sodium: 121 mmol/L — ABNORMAL LOW (ref 135–145)
TOTAL PROTEIN: 5.5 g/dL — AB (ref 6.5–8.1)

## 2017-12-03 LAB — GLUCOSE, CAPILLARY
GLUCOSE-CAPILLARY: 146 mg/dL — AB (ref 65–99)
Glucose-Capillary: 139 mg/dL — ABNORMAL HIGH (ref 65–99)

## 2017-12-03 LAB — VANCOMYCIN, TROUGH: VANCOMYCIN TR: 19 ug/mL (ref 15–20)

## 2017-12-03 MED ORDER — FUROSEMIDE 10 MG/ML IJ SOLN
20.0000 mg | Freq: Two times a day (BID) | INTRAMUSCULAR | Status: DC
  Administered 2017-12-03 – 2017-12-05 (×5): 20 mg via INTRAVENOUS
  Filled 2017-12-03 (×5): qty 2

## 2017-12-03 MED ORDER — ALUM & MAG HYDROXIDE-SIMETH 200-200-20 MG/5ML PO SUSP
30.0000 mL | ORAL | Status: DC | PRN
  Administered 2017-12-03: 30 mL via ORAL
  Filled 2017-12-03: qty 30

## 2017-12-03 NOTE — Progress Notes (Signed)
[facility]  Patient Status: Active (receiving services up to time of hospitalization)  [facility] is providing the following services: RN, PT and HHA  If patient discharges after hours, please call [phone].   Willie Williamson 12/03/2017, 1:10 PM

## 2017-12-03 NOTE — Consult Note (Signed)
Reason for Consult: Hyponatremia Referring Physician: Dr. Erenest Rasher Willie Williamson is an 84 y.o. male.  HPI: He is a patient who has history of coronary artery disease, hypertension, COPD, recently admitted with MRSA bacteremia and endocarditis discharged home about a week ago.  Patient came back to the hospital with complaints of decreased urine output, difficulty in breathing and tachycardia.  When he was evaluated patient does also have hyponatremia.  Initially his hyponatremia improved but presently seems to be declining hence consult is called.  Patient presently complains of weakness otherwise feels okay.  He denies any nausea or vomiting.  He has still difficulty breathing and some cough.  Past Medical History:  Diagnosis Date  . Anemia   . CAD (coronary artery disease)    a. 03/2014 - s/p DES to RCA, nonobstructive disease in LAD/Cx, EF 60%.  . Cataract, congenital    "left eye"  . COPD (chronic obstructive pulmonary disease) with emphysema ([place])    a. Chronic resp failure on home O2 (2L).  . H/O hiatal hernia   . HTN (hypertension)   . Hyperlipidemia   . Hypothyroidism   . Malignant neoplasm of bladder, part unspecified    "it was cured"     Past Surgical History:  Procedure Laterality Date  . BLADDER SURGERY  ` 2006   "bladder polyps"  . CARDIAC CATHETERIZATION  2008  . CORONARY ANGIOPLASTY WITH STENT PLACEMENT  04/16/2014   "1"  . LEFT HEART CATHETERIZATION WITH CORONARY ANGIOGRAM N/A 04/16/2014   Procedure: LEFT HEART CATHETERIZATION WITH CORONARY ANGIOGRAM;  Surgeon: [name], MD;  Location: [facility] CATH LAB;  Service: Cardiovascular;  Laterality: N/A;  . PERCUTANEOUS CORONARY STENT INTERVENTION (PCI-S)  04/16/2014   Procedure: PERCUTANEOUS CORONARY STENT INTERVENTION (PCI-S);  Surgeon: [name], MD;  Location: [facility] CATH LAB;  Service: Cardiovascular;;  . TONSILLECTOMY    . TURP VAPORIZATION      Family History  Problem Relation Age of Onset  . Bone cancer  Mother   . Hypertension Father   . Lung disease Neg Hx     Social History:  reports that he quit smoking about 21 years ago. His smoking use included cigarettes. He started smoking about 68 years ago. He has a 72.00 pack-year smoking history. he has never used smokeless tobacco. He reports that he does not drink alcohol or use drugs.  Allergies:  Allergies  Allergen Reactions  . Penicillins Rash    Has patient had a PCN reaction causing immediate rash, facial/tongue/throat swelling, SOB or lightheadedness with hypotension: Yes Has patient had a PCN reaction causing severe rash involving mucus membranes or skin necrosis: No Has patient had a PCN reaction that required hospitalization: No Has patient had a PCN reaction occurring within the last 10 years: No If all of the above answers are "NO", then may proceed with Cephalosporin use.     Medications: I have reviewed the patient's current medications.  Results for orders placed or performed during the hospital encounter of 11/30/17 (from the past 48 hour(s))  Glucose, capillary     Status: Abnormal   Collection Time: 12/01/17 11:38 AM  Result Value Ref Range   Glucose-Capillary 128 (H) 65 - 99 mg/dL   Comment 1 Notify RN    Comment 2 Document in Chart   Glucose, capillary     Status: Abnormal   Collection Time: 12/01/17  5:25 PM  Result Value Ref Range   Glucose-Capillary 164 (H) 65 - 99 mg/dL  Glucose,  capillary     Status: Abnormal   Collection Time: 12/01/17  9:04 PM  Result Value Ref Range   Glucose-Capillary 196 (H) 65 - 99 mg/dL  Basic metabolic panel     Status: Abnormal   Collection Time: 12/02/17  5:55 AM  Result Value Ref Range   Sodium 124 (L) 135 - 145 mmol/L   Potassium 3.7 3.5 - 5.1 mmol/L   Chloride 89 (L) 101 - 111 mmol/L   CO2 24 22 - 32 mmol/L   Glucose, Bld 232 (H) 65 - 99 mg/dL   BUN 12 6 - 20 mg/dL   Creatinine, Ser 0.57 (L) 0.61 - 1.24 mg/dL   Calcium 8.4 (L) 8.9 - 10.3 mg/dL   GFR calc non Af Amer  >60 >60 mL/min   GFR calc Af Amer >60 >60 mL/min    Comment: (NOTE) The eGFR has been calculated using the CKD EPI equation. This calculation has not been validated in all clinical situations. eGFR's persistently <60 mL/min signify possible Chronic Kidney Disease.    Anion gap 11 5 - 15  CBC     Status: Abnormal   Collection Time: 12/02/17  5:55 AM  Result Value Ref Range   WBC 9.1 4.0 - 10.5 K/uL   RBC 3.53 (L) 4.22 - 5.81 MIL/uL   Hemoglobin 11.1 (L) 13.0 - 17.0 g/dL   HCT 31.6 (L) 39.0 - 52.0 %   MCV 89.5 78.0 - 100.0 fL   MCH 31.4 26.0 - 34.0 pg   MCHC 35.1 30.0 - 36.0 g/dL   RDW 13.5 11.5 - 15.5 %   Platelets 274 150 - 400 K/uL  Glucose, capillary     Status: Abnormal   Collection Time: 12/02/17  7:35 AM  Result Value Ref Range   Glucose-Capillary 202 (H) 65 - 99 mg/dL   Comment 1 Notify RN    Comment 2 Document in Chart   Glucose, capillary     Status: Abnormal   Collection Time: 12/02/17 11:43 AM  Result Value Ref Range   Glucose-Capillary 195 (H) 65 - 99 mg/dL   Comment 1 Notify RN    Comment 2 Document in Chart   Glucose, capillary     Status: Abnormal   Collection Time: 12/02/17  4:34 PM  Result Value Ref Range   Glucose-Capillary 200 (H) 65 - 99 mg/dL   Comment 1 Notify RN    Comment 2 Document in Chart   Glucose, capillary     Status: Abnormal   Collection Time: 12/02/17  9:04 PM  Result Value Ref Range   Glucose-Capillary 195 (H) 65 - 99 mg/dL   Comment 1 Notify RN    Comment 2 Document in Chart   Comprehensive metabolic panel     Status: Abnormal   Collection Time: 12/03/17  4:47 AM  Result Value Ref Range   Sodium 121 (L) 135 - 145 mmol/L   Potassium 3.9 3.5 - 5.1 mmol/L   Chloride 86 (L) 101 - 111 mmol/L   CO2 24 22 - 32 mmol/L   Glucose, Bld 156 (H) 65 - 99 mg/dL   BUN 16 6 - 20 mg/dL   Creatinine, Ser 0.49 (L) 0.61 - 1.24 mg/dL   Calcium 8.6 (L) 8.9 - 10.3 mg/dL   Total Protein 5.5 (L) 6.5 - 8.1 g/dL   Albumin 2.8 (L) 3.5 - 5.0 g/dL   AST 19  15 - 41 U/L   ALT 21 17 - 63 U/L   Alkaline Phosphatase 73  38 - 126 U/L   Total Bilirubin 1.1 0.3 - 1.2 mg/dL   GFR calc non Af Amer >60 >60 mL/min   GFR calc Af Amer >60 >60 mL/min    Comment: (NOTE) The eGFR has been calculated using the CKD EPI equation. This calculation has not been validated in all clinical situations. eGFR's persistently <60 mL/min signify possible Chronic Kidney Disease.    Anion gap 11 5 - 15  Glucose, capillary     Status: Abnormal   Collection Time: 12/03/17  7:53 AM  Result Value Ref Range   Glucose-Capillary 146 (H) 65 - 99 mg/dL   Comment 1 Notify RN    Comment 2 Document in Chart     No results found.  Review of Systems  Constitutional: Positive for malaise/fatigue. Negative for chills and fever.  Respiratory: Positive for cough and shortness of breath.   Cardiovascular: Positive for orthopnea. Negative for chest pain and palpitations.  Gastrointestinal: Negative for abdominal pain, nausea and vomiting.  Neurological: Positive for weakness.   Blood pressure (!) 168/91, pulse 100, temperature 97.6 F (36.4 C), temperature source Oral, resp. rate 19, height _0  (1.702 m), weight 65.5 kg (144 lb 6.4 oz), SpO2 98 %. Physical Exam  Constitutional: He is oriented to person, place, and time. No distress.  Eyes: No scleral icterus.  Neck: No JVD present.  Cardiovascular: Normal rate and regular rhythm.  Respiratory: No respiratory distress. He has wheezes. He has no rales.  On nasal oxygen  GI: He exhibits no distension. There is no tenderness.  Musculoskeletal: He exhibits edema.  Neurological: He is alert and oriented to person, place, and time.    Assessment/Plan: 1] hyponatremia: His sodium is 121.  Serum osmolality was 260 low hence 2 hyponatremia.  Urine sodium is 87 and urine osmolality is 652.  In presence of leg edema most likely this is hypovolemic hyponatremia.  Patient presently on Lasix.  His sodium is declining. 2] difficulty  breathing: Most likely from COPD and fluid overload.  He is on nasal oxygen and steroid ;he states that is slightly getting better. 3] hypertension: His blood pressure is reasonably controlled 4] history of bladder cancer status post removal of tumor x3 according to the patient.  He has also history of BPH 5] coronary artery disease 6] MRSA bacteremia and endocarditis he is on vancomycin. Plan: 1] I agree with Lasix but will change it to 20 mg IV twice daily 2] we will put him on free water restriction.  No ice 3] we will check his renal panel in the morning.   Jireh Elmore S 12/03/2017, 9:33 AM

## 2017-12-03 NOTE — Progress Notes (Signed)
PROGRESS NOTE    Willie Williamson  IRC:[id number] DOB: 1931/01/04 DOA: 11/30/2017 PCP: [name], MD    Brief Narrative:  84 year old male who was recently discharged from the hospital after being treated for MRSA bacteremia, return to the hospital with generalized weakness and poor urine output.  He was found to have significant hyponatremia with a sodium of 120.  Blood pressure was noted to be in the lower side.  He was started on intravenous fluids.  He was continued on his home regimen of vancomycin.  Foley catheter was placed for possible urinary retention.  He is generally weak and will likely need skilled nursing facility placement.   Assessment & Plan:   Principal Problem:   Hyponatremia Active Problems:   Essential hypertension   COPD with emphysema (HCC)   Chronic respiratory failure with hypoxia (HCC)   MRSA bacteremia   Infective endocarditis   1. Hyponatremia.  Initially felt to be hypovolemic.  Serum sodium 120 on admission.  Started on IV fluids with improvement of sodium to 126.  At this time, he appears to be hypervolemic with evidence of peripheral edema.  Nephrology consulted and started him on intravenous Lasix.  Continue with free water restriction. 2. Recent MRSA bacteremia with endocarditis.  Continue outpatient regimen of vancomycin.  He will complete his vancomycin course on 12/30/17 3. Chronic respiratory failure with steroid dependent COPD.  Chronically on 2 L of oxygen.  More recently, reports needing 3 L of oxygen.  Overall wheezing is improving.  Continue on solumedrol and neb treatments. 4. Hypertension.  Resume home dose of Coreg 5. Hypothyroidism.  Continue on Synthroid 6. Possible urinary retention.  Foley catheter placed in the emergency room.  Continue on Flomax.  He has a history of BPH.  Will attempt voiding trial prior to discharge. 7. Generalized weakness.  Plans are for skilled nursing facility placement on discharge   DVT prophylaxis:  Lovenox Code Status: DNR Family Communication: discussed with son over the phone Disposition Plan: Will likely need skilled nursing facility placement, PT eval requested.   Consultants:   Nephrology  Procedures:     Antimicrobials:   Vancomycin   Subjective: Feels that her breathing is doing a little better today.  Continues to have cough.  Objective: Vitals:   12/03/17 0211 12/03/17 0700 12/03/17 0725 12/03/17 0734  BP:  (!) 168/91    Pulse: (!) 102 100    Resp: 20 19    Temp:  97.6 F (36.4 C)    TempSrc:  Oral    SpO2: 97% 98% 98% 98%  Weight:      Height:        Intake/Output Summary (Last 24 hours) at 12/03/2017 1909 Last data filed at 12/03/2017 0900 Gross per 24 hour  Intake -  Output 400 ml  Net -400 ml   Filed Weights   11/30/17 1810  Weight: 65.5 kg (144 lb 6.4 oz)    Examination:  General exam: Appears calm and comfortable  Respiratory system: Fair air entry bilaterally, wheezing is better, respiratory effort normal Cardiovascular system: S1 & S2 heard, RRR. No JVD, murmurs, rubs, gallops or clicks. 1+ pedal edema, anasarca, scrotal edema. Gastrointestinal system: Abdomen is nondistended, soft and nontender. No organomegaly or masses felt. Normal bowel sounds heard. Central nervous system: Alert and oriented. No focal neurological deficits. Extremities: Symmetric 5 x 5 power. Skin: No rashes, lesions or ulcers Psychiatry: Judgement and insight appear normal. Mood & affect appropriate.     Data  Reviewed: I have personally reviewed following labs and imaging studies  CBC: Recent Labs  Lab 11/30/17 1242 12/01/17 0600 12/02/17 0555  WBC 14.8* 11.6* 9.1  NEUTROABS 13.6*  --   --   HGB 11.7* 10.7* 11.1*  HCT 34.1* 31.6* 31.6*  MCV 90.7 92.4 89.5  PLT 308 263 024   Basic Metabolic Panel: Recent Labs  Lab 11/30/17 1242 11/30/17 1912 12/01/17 0600 12/02/17 0555 12/03/17 0447  NA 120* 124* 126* 124* 121*  K 3.8 3.7 3.7 3.7 3.9  CL  86* 94* 94* 89* 86*  CO2 19* 19* 19* 24 24  GLUCOSE 98 69 56* 232* 156*  BUN 20 16 13 12 16   CREATININE 0.55* 0.51* 0.53* 0.57* 0.49*  CALCIUM 8.2* 7.2* 7.7* 8.4* 8.6*   GFR: Estimated Creatinine Clearance: 63.7 mL/min (A) (by C-G formula based on SCr of 0.49 mg/dL (L)). Liver Function Tests: Recent Labs  Lab 11/30/17 1242 12/03/17 0447  AST 27 19  ALT 31 21  ALKPHOS 86 73  BILITOT 1.5* 1.1  PROT 5.9* 5.5*  ALBUMIN 3.1* 2.8*   No results for input(s): LIPASE, AMYLASE in the last 168 hours. No results for input(s): AMMONIA in the last 168 hours. Coagulation Profile: Recent Labs  Lab 11/30/17 1242  INR 1.07   Cardiac Enzymes: No results for input(s): CKTOTAL, CKMB, CKMBINDEX, TROPONINI in the last 168 hours. BNP (last 3 results) No results for input(s): PROBNP in the last 8760 hours. HbA1C: No results for input(s): HGBA1C in the last 72 hours. CBG: Recent Labs  Lab 12/02/17 0735 12/02/17 1143 12/02/17 1634 12/02/17 2104 12/03/17 0753  GLUCAP 202* 195* 200* 195* 146*   Lipid Profile: No results for input(s): CHOL, HDL, LDLCALC, TRIG, CHOLHDL, LDLDIRECT in the last 72 hours. Thyroid Function Tests: No results for input(s): TSH, T4TOTAL, FREET4, T3FREE, THYROIDAB in the last 72 hours. Anemia Panel: No results for input(s): VITAMINB12, FOLATE, FERRITIN, TIBC, IRON, RETICCTPCT in the last 72 hours. Sepsis Labs: Recent Labs  Lab 11/30/17 1259  LATICACIDVEN 1.1    Recent Results (from the past 240 hour(s))  Culture, blood (Routine x 2)     Status: None (Preliminary result)   Collection Time: 11/30/17 12:42 PM  Result Value Ref Range Status   Specimen Description BLOOD PICC LINE  Final   Special Requests   Final    BOTTLES DRAWN AEROBIC AND ANAEROBIC Blood Culture adequate volume   Culture NO GROWTH 3 DAYS  Final   Report Status PENDING  Incomplete  Culture, blood (Routine x 2)     Status: None (Preliminary result)   Collection Time: 11/30/17 12:59 PM    Result Value Ref Range Status   Specimen Description BLOOD LEFT ARM  Final   Special Requests   Final    BOTTLES DRAWN AEROBIC AND ANAEROBIC Blood Culture adequate volume   Culture NO GROWTH 3 DAYS  Final   Report Status PENDING  Incomplete  Culture, Urine     Status: None   Collection Time: 11/30/17  9:40 PM  Result Value Ref Range Status   Specimen Description URINE, CLEAN CATCH  Final   Special Requests NONE  Final   Culture   Final    NO GROWTH Performed at [facility], [place] [address], [place], [place] [address]    Report Status 12/02/2017 FINAL  Final         Radiology Studies: US Venous Img Upper Uni Right  Result Date: 12/03/2017 CLINICAL DATA:  Right upper extremity  bladder/right upper extremity edema for the past 3-4 days. History of right basilic approach upper extremity PICC line. History of bladder cancer. Evaluate for DVT. EXAM: RIGHT UPPER EXTREMITY VENOUS DOPPLER ULTRASOUND TECHNIQUE: Gray-scale sonography with graded compression, as well as color Doppler and duplex ultrasound were performed to evaluate the upper extremity deep venous system from the level of the subclavian vein and including the jugular, axillary, basilic, radial, ulnar and upper cephalic vein. Spectral Doppler was utilized to evaluate flow at rest and with distal augmentation maneuvers. COMPARISON:  None. FINDINGS: Contralateral Subclavian Vein: Respiratory phasicity is normal and symmetric with the symptomatic side. No evidence of thrombus. Normal compressibility. Internal Jugular Vein: No evidence of thrombus. Normal compressibility, respiratory phasicity and response to augmentation. Subclavian Vein: No evidence of thrombus. Normal compressibility, respiratory phasicity and response to augmentation. A PICC line is seen within the right subclavian vein (images 10 and 19). Axillary Vein: No evidence of thrombus. Normal compressibility, respiratory phasicity and response to augmentation. A PICC line  is seen within the right axillary vein (images 22 and 23). Cephalic Vein: No evidence of thrombus. Normal compressibility, respiratory phasicity and response to augmentation. A PICC line is seen within the right basilic vein (images 59 and 60). Basilic Vein: No evidence of thrombus. Normal compressibility, respiratory phasicity and response to augmentation. Brachial Veins: No evidence of thrombus. Normal compressibility, respiratory phasicity and response to augmentation. Radial Veins: No evidence of thrombus. Normal compressibility, respiratory phasicity and response to augmentation. Ulnar Veins: No evidence of thrombus. Normal compressibility, respiratory phasicity and response to augmentation. Venous Reflux:  None visualized. Other Findings: There is a moderate amount of subcutaneous edema at the level of the elbow and forearm (images 63 and 64). IMPRESSION: No evidence of DVT within the right upper extremity. Specifically, there is no evidence of DVT associated with the right basilic vein approach PICC line. Electronically Signed   By: Sandi Mariscal M.D.   On: 12/03/2017 13:53        Scheduled Meds: . carvedilol  6.25 mg Oral BID WC  . enoxaparin (LOVENOX) injection  40 mg Subcutaneous Q24H  . feeding supplement (ENSURE ENLIVE)  237 mL Oral BID BM  . furosemide  20 mg Intravenous BID  . guaiFENesin  1,200 mg Oral BID  . levothyroxine  88 mcg Oral QAC breakfast  . methylPREDNISolone (SOLU-MEDROL) injection  60 mg Intravenous Q12H  . mometasone-formoterol  2 puff Inhalation BID  . pravastatin  20 mg Oral q1800  . tamsulosin  0.4 mg Oral QPC supper   Continuous Infusions: . vancomycin Stopped (12/03/17 1553)     LOS: 3 days    Time spent: 21mins    Kathie Dike, MD [facility] Pager [phone]  If 7PM-7AM, please contact night-coverage [email or web address] Password [facility] 12/03/2017, 7:09 PM

## 2017-12-03 NOTE — Evaluation (Signed)
Physical Therapy Evaluation Patient Details Name: Willie Williamson MRN: [id number] DOB: [birth year] Today's Date: 12/03/2017   History of Present Illness  Willie Williamson is a 84 y.o. male with medical history significant for recent 6 day admission and discharge for MRSA bacteremia and endocarditis, 11/16/17- 11/22/17.,  Also history of COPD, Bladder cancer, BPH.      Clinical Impression  Patient limited for functional mobility as stated below secondary to BLE weakness, fatigue and poor standing balance.  Patient will benefit from continued physical therapy in hospital and recommended venue below to increase strength, balance, endurance for safe ADLs and gait.    Follow Up Recommendations SNF;Supervision/Assistance - 24 hour    Equipment Recommendations  None recommended by PT    Recommendations for Other Services       Precautions / Restrictions Precautions Precautions: Fall Precaution Comments: fragile weepy skin RUE Restrictions Weight Bearing Restrictions: No      Mobility  Bed Mobility Overal bed mobility: Needs Assistance Bed Mobility: Supine to Sit     Supine to sit: Supervision     General bed mobility comments: demonstrates slow labored movement  Transfers Overall transfer level: Needs assistance Equipment used: Rolling walker (2 wheeled) Transfers: Sit to/from [facility] Sit to Stand: Mod assist Stand pivot transfers: Mod assist       General transfer comment: unsteady on feet  Ambulation/Gait Ambulation/Gait assistance: Mod assist;Max assist Ambulation Distance (Feet): 3 Feet Assistive device: Rolling walker (2 wheeled) Gait Pattern/deviations: Decreased step length - right;Decreased step length - left;Decreased stride length     General Gait Details: limited to a few steps due to fair/poor standing balance, c/o fatigue/weakness and mildy SOB while on 8 LPM O2  Stairs            Wheelchair Mobility    Modified Rankin (Stroke  Patients Only)       Balance Overall balance assessment: Needs assistance Sitting-balance support: No upper extremity supported;Feet supported Sitting balance-Leahy Scale: Good     Standing balance support: Bilateral upper extremity supported;During functional activity Standing balance-Leahy Scale: Fair Standing balance comment: fair/poor with RW                             Pertinent Vitals/Pain Pain Assessment: No/denies pain Pain Location: patient stated history of chronic back pain    Home Living Family/patient expects to be discharged to:: Private residence Living Arrangements: Children Available Help at Discharge: Family;Available 24 hours/day Type of Home: House Home Access: Stairs to enter Entrance Stairs-Rails: None Entrance Stairs-Number of Steps: 4  Home Layout: One level;Laundry or work area in [place]: Environmental consultant - 2 wheels;Cane - single point;Bedside commode;Shower seat - built in      Prior Function Level of Independence: Independent with assistive device(s)               Hand Dominance   Dominant Hand: Right    Extremity/Trunk Assessment   Upper Extremity Assessment Upper Extremity Assessment: Generalized weakness    Lower Extremity Assessment Lower Extremity Assessment: Generalized weakness    Cervical / Trunk Assessment Cervical / Trunk Assessment: Normal  Communication   Communication: HOH  Cognition Arousal/Alertness: Awake/alert Behavior During Therapy: WFL for tasks assessed/performed Overall Cognitive Status: Within Functional Limits for tasks assessed  General Comments      Exercises     Assessment/Plan    PT Assessment Patient needs continued PT services  PT Problem List Decreased strength;Decreased activity tolerance;Decreased balance;Decreased mobility       PT Treatment Interventions Gait training;Functional mobility training;Stair  training;Therapeutic activities;Therapeutic exercise;Patient/family education    PT Goals (Current goals can be found in the Care Plan section)  Acute Rehab PT Goals Patient Stated Goal: return home after rehab stay PT Goal Formulation: With patient Time For Goal Achievement: 12/17/17 Potential to Achieve Goals: Good    Frequency Min 3X/week   Barriers to discharge        Co-evaluation               AM-PAC PT "6 Clicks" Daily Activity  Outcome Measure Difficulty turning over in bed (including adjusting bedclothes, sheets and blankets)?: None Difficulty moving from lying on back to sitting on the side of the bed? : None Difficulty sitting down on and standing up from a chair with arms (e.g., wheelchair, bedside commode, etc,.)?: A Little Help needed moving to and from a bed to chair (including a wheelchair)?: A Lot Help needed walking in hospital room?: A Lot Help needed climbing 3-5 steps with a railing? : Total 6 Click Score: 16    End of Session Equipment Utilized During Treatment: Oxygen Activity Tolerance: Patient limited by fatigue Patient left: in chair;with call bell/phone within reach(RN aware patient left in chair) Nurse Communication: Mobility status PT Visit Diagnosis: Unsteadiness on feet (R26.81);Other abnormalities of gait and mobility (R26.89);Muscle weakness (generalized) (M62.81)    Time: 3300-7622 PT Time Calculation (min) (ACUTE ONLY): 24 min   Charges:   PT Evaluation $PT Eval Moderate Complexity: 1 Mod PT Treatments $Therapeutic Activity: 23-37 mins   PT G Codes:        9:32 AM, 01-01-18 Lonell Grandchild, MPT Physical Therapist with [facility] [phone] office [phone] mobile phone

## 2017-12-03 NOTE — NC FL2 (Signed)
[place] LEVEL OF CARE SCREENING TOOL     IDENTIFICATION  Patient Name: Willie Williamson Birthdate: [birth year] Sex: male Admission Date (Current Location): 11/30/2017  [facility] and [place] Number:  [employer] and Address:  [facility] [address], [place]      Provider Number: [id number]  Attending Physician Name and Address:  Kathie Dike, MD  Relative Name and Phone Number:       Current Level of Care: SNF Recommended Level of Care: [place] Prior Approval Number:    Date Approved/Denied:   PASRR Number: [id number]  Discharge Plan: SNF    Current Diagnoses: Patient Active Problem List   Diagnosis Date Noted  . Hyponatremia 11/30/2017  . 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 Lumbar compression fracture, sequela 11/22/2017  . BPH (benign prostatic hyperplasia) 11/22/2017  . Weakness generalized 11/22/2017  . MRSA bacteremia 11/17/2017  . Tachycardia 11/16/2017  . Dyspnea 08/08/2017  . Chronic respiratory failure with hypoxia ([place]) 11/09/2015  . Preoperative clearance 08/11/2015  . Unstable angina ([place]) 04/17/2014  . Nonspecific abnormal unspecified cardiovascular function study 04/16/2014  . TINEA CORPORIS 04/20/2010  . Essential hypertension 09/06/2009  . BLADDER CANCER 01/10/2008  . HYPERLIPIDEMIA 01/10/2008  . Coronary atherosclerosis 01/10/2008  . EMPHYSEMA 10/10/2007  . COPD with emphysema ([place]) 10/10/2007    Orientation RESPIRATION BLADDER Height & Weight     Self, Time, Situation, Place  O2(3L) Continent Weight: 144 lb 6.4 oz (65.5 kg) Height:  5\' 7"  (170.2 cm)  BEHAVIORAL SYMPTOMS/MOOD NEUROLOGICAL BOWEL NUTRITION STATUS      Continent Diet(Heart. Fluid restriction: No free water or ice. Liberalize salt intake)  AMBULATORY STATUS COMMUNICATION OF NEEDS Skin   Extensive Assist Verbally Normal                       Personal Care Assistance Level of Assistance   Feeding, Dressing, Bathing Bathing Assistance: Limited assistance Feeding assistance: Independent Dressing Assistance: Limited assistance     Functional Limitations Info  Sight, Hearing, Speech Sight Info: Adequate Hearing Info: Adequate Speech Info: Adequate    SPECIAL CARE FACTORS FREQUENCY  PT (By licensed PT)     PT Frequency: 5x/week              Contractures      Additional Factors Info  Code Status, Allergies, Isolation Precautions Code Status Info: DNR Allergies Info: Penicillins           Current Medications (12/03/2017):  This is the current hospital active medication list Current Facility-Administered Medications  Medication Dose Route Frequency Provider Last Rate Last Dose  . acetaminophen (TYLENOL) tablet 650 mg  650 mg Oral Q6H PRN Emokpae, Ejiroghene E, MD       Or  . acetaminophen (TYLENOL) suppository 650 mg  650 mg Rectal Q6H PRN Emokpae, Ejiroghene E, MD      . alum & mag hydroxide-simeth (MAALOX/MYLANTA) 200-200-20 MG/5ML suspension 30 mL  30 mL Oral Q4H PRN Oswald Hillock, MD   30 mL at 12/03/17 0503  . carvedilol (COREG) tablet 6.25 mg  6.25 mg Oral BID WC Kathie Dike, MD   6.25 mg at 12/03/17 0847  . enoxaparin (LOVENOX) injection 40 mg  40 mg Subcutaneous Q24H Emokpae, Ejiroghene E, MD   40 mg at 12/02/17 2055  . feeding supplement (ENSURE ENLIVE) (ENSURE ENLIVE) liquid 237 mL  237 mL Oral BID BM Kathie Dike, MD   237 mL at 12/03/17 0932  .  furosemide (LASIX) injection 20 mg  20 mg Intravenous BID Fran Lowes, MD   20 mg at 12/03/17 1010  . guaiFENesin (MUCINEX) 12 hr tablet 1,200 mg  1,200 mg Oral BID Kathie Dike, MD   1,200 mg at 12/03/17 0844  . HYDROcodone-acetaminophen (NORCO/VICODIN) 5-325 MG per tablet 1 tablet  1 tablet Oral Q6H PRN Emokpae, Ejiroghene E, MD      . ipratropium (ATROVENT) nebulizer solution 0.5 mg  0.5 mg Nebulization Q6H PRN Oswald Hillock, MD   0.5 mg at 12/03/17 0725  . levalbuterol (XOPENEX) nebulizer  solution 0.63 mg  0.63 mg Nebulization Q6H PRN Oswald Hillock, MD   0.63 mg at 12/03/17 0725  . levothyroxine (SYNTHROID, LEVOTHROID) tablet 88 mcg  88 mcg Oral QAC breakfast Emokpae, Ejiroghene E, MD   88 mcg at 12/03/17 0845  . methylPREDNISolone sodium succinate (SOLU-MEDROL) 125 mg/2 mL injection 60 mg  60 mg Intravenous Q12H Kathie Dike, MD   60 mg at 12/03/17 0503  . mometasone-formoterol (DULERA) 200-5 MCG/ACT inhaler 2 puff  2 puff Inhalation BID Emokpae, Ejiroghene E, MD   2 puff at 12/03/17 0733  . pravastatin (PRAVACHOL) tablet 20 mg  20 mg Oral q1800 Emokpae, Ejiroghene E, MD   20 mg at 12/02/17 1705  . promethazine (PHENERGAN) tablet 12.5 mg  12.5 mg Oral Q6H PRN Emokpae, Ejiroghene E, MD      . tamsulosin (FLOMAX) capsule 0.4 mg  0.4 mg Oral QPC supper Emokpae, Ejiroghene E, MD   0.4 mg at 12/02/17 1705  . vancomycin (VANCOCIN) IVPB 1000 mg/200 mL premix  1,000 mg Intravenous Q12H Bethena Roys, MD   Stopped at 12/03/17 0244     Discharge Medications: Please see discharge summary for a list of discharge medications.  Relevant Imaging Results:  Relevant Lab Results:   Additional Information MRSA + by PCR 11/16/2017; MRSA + blood culture 11/16/2017 and 11/18/2017  Ophie Burrowes, Clydene Pugh, LCSW

## 2017-12-03 NOTE — Care Management (Signed)
CM consulted for ? Home needs. PT recommends SNF. Patient agreeable at this time, CM will sign off. CSW consulted for placement.

## 2017-12-03 NOTE — Clinical Social Work Note (Signed)
Clinical Social Work Assessment  Patient Details  Name: Willie Williamson MRN: [id number] Date of Birth: February 15, 1931  Date of referral:  12/03/17               Reason for consult:  Facility Placement                Permission sought to share information with:    Permission granted to share information::     Name::        Agency::     Relationship::     Contact Information:     Housing/Transportation Living arrangements for the past 2 months:  [place] of Information:  Patient Patient Interpreter Needed:  None Criminal Activity/Legal Involvement Pertinent to Current Situation/Hospitalization:  No - Comment as needed Significant Relationships:  Adult Children, Other Family Members Lives with:  Adult Children Do you feel safe going back to the place where you live?  Yes Need for family participation in patient care:  Yes (Comment)  Care giving concerns:  Patient is independent at baseline.    Social Worker assessment / plan:  Patient lives in the home with his son and daughter in [place]. At baseline he ambulates unassisted, drives, cooks, and is independent in all ADLs.  Patient states that he is interested in short term SNF.   Employment status:  Retired Nurse, adult PT Recommendations:  [place] / Referral to community resources:  [place]  Patient/Family's Response to care:  Patient is agreeable to short term SNF.   Patient/Family's Understanding of and Emotional Response to Diagnosis, Current Treatment, and Prognosis:  Patient understands his diagnosis, treatment and prognosis.   Emotional Assessment Appearance:  Appears stated age Attitude/Demeanor/Rapport:    Affect (typically observed):  Accepting, Calm, Pleasant Orientation:  Oriented to Self, Oriented to Place, Oriented to  Time, Oriented to Situation Alcohol / Substance use:  Not Applicable Psych involvement (Current and /or in  the community):     Discharge Needs  Concerns to be addressed:  Discharge Planning Concerns Readmission within the last 30 days:  Yes Current discharge risk:  None Barriers to Discharge:  No Barriers Identified   Ihor Gully, LCSW 12/03/2017, 3:18 PM

## 2017-12-03 NOTE — Progress Notes (Signed)
Pharmacy Antibiotic Note  Willie Williamson is a 84 y.o. male admitted on 11/30/2017 with bacteremia and endocarditis  Pharmacy has been consulted for Vancomycin dosing. VT reported as 4mcg/ml and is therapeutic with change in dosing regimen. Pt has been afebrile, WBC is back to normal..  Plan: Continue Vancomycin 1000mg  IV every 12 hours.  Goal trough 15-20 mcg/mL. F/u cxs and clinical progress Monitor V/S, labs and levels as indicated  Height: 5\' 7"  (170.2 cm) Weight: 144 lb 6.4 oz (65.5 kg) IBW/kg (Calculated) : 66.1  Temp (24hrs), Avg:97.7 F (36.5 C), Min:97.6 F (36.4 C), Max:97.7 F (36.5 C)  Recent Labs  Lab 11/30/17 1242 11/30/17 1259 11/30/17 1912 12/01/17 0600 12/02/17 0555 12/03/17 0447 12/03/17 1400  WBC 14.8*  --   --  11.6* 9.1  --   --   CREATININE 0.55*  --  0.51* 0.53* 0.57* 0.49*  --   LATICACIDVEN  --  1.1  --   --   --   --   --   VANCOTROUGH  --   --   --   --   --   --  19    Estimated Creatinine Clearance: 63.7 mL/min (A) (by C-G formula based on SCr of 0.49 mg/dL (L)).    Allergies  Allergen Reactions  . Penicillins Rash    Has patient had a PCN reaction causing immediate rash, facial/tongue/throat swelling, SOB or lightheadedness with hypotension: Yes Has patient had a PCN reaction causing severe rash involving mucus membranes or skin necrosis: No Has patient had a PCN reaction that required hospitalization: No Has patient had a PCN reaction occurring within the last 10 years: No If all of the above answers are "NO", then may proceed with Cephalosporin use.     Antimicrobials this admission: *12/28 Vancomycin>>OPAT Vancomycin for 6 weeks (Thru 12/30/2017) 1/11 Aztreonam 2gm x 1 dose Dose changes: 1/4 Vancomycin dose change to 1500mg  daily as outpt(previous therapeutic dose 1gm IV q12h) 1/11 per [facility] RN dose changed 2000mg  daily b/c level subtherapeutic(9.7) 1/11 Vancomycin 1gm IV q12h Microbiology: 1/11 Bcx: ngtd 1/11 UCx: pending 12/31  Repeat BCX: ngtd 12/30 Repeat BCx: MRSA 12/28 BCx: MRSAThank you for allowing pharmacy to be a part of this patient's care.  Willie Williamson, BS Willie Williamson, [place] Clinical Pharmacist Pager [phone] 12/03/2017 3:30 PM

## 2017-12-03 NOTE — Plan of Care (Signed)
  Acute Rehab PT Goals(only PT should resolve) Pt Will Go Supine/Side To Sit 12/03/2017 0935 - Progressing by Lonell Grandchild, PT Flowsheets Taken 12/03/2017 0935  Pt will go Supine/Side to Sit with modified independence Patient Will Transfer Sit To/From Stand 12/03/2017 0935 - Progressing by Lonell Grandchild, PT Flowsheets Taken 12/03/2017 0935  Patient will transfer sit to/from stand with min guard assist Pt Will Transfer Bed To Chair/Chair To Bed 12/03/2017 0935 - Progressing by Lonell Grandchild, PT Flowsheets Taken 12/03/2017 0935  Pt will Transfer Bed to Chair/Chair to Bed min guard assist Pt Will Ambulate 12/03/2017 0935 - Progressing by Lonell Grandchild, PT Flowsheets Taken 12/03/2017 0935  Pt will Ambulate 25 feet;with minimal assist;with rolling walker  9:36 AM, 12/03/17 Lonell Grandchild, MPT Physical Therapist with [facility] [phone] office [phone] mobile phone

## 2017-12-04 LAB — GLUCOSE, CAPILLARY
Glucose-Capillary: 159 mg/dL — ABNORMAL HIGH (ref 65–99)
Glucose-Capillary: 186 mg/dL — ABNORMAL HIGH (ref 65–99)
Glucose-Capillary: 142 mg/dL — ABNORMAL HIGH (ref 65–99)

## 2017-12-04 LAB — RENAL FUNCTION PANEL
Albumin: 2.8 g/dL — ABNORMAL LOW (ref 3.5–5.0)
Anion gap: 10 (ref 5–15)
BUN: 19 mg/dL (ref 6–20)
CHLORIDE: 79 mmol/L — AB (ref 101–111)
CO2: 31 mmol/L (ref 22–32)
CREATININE: 0.58 mg/dL — AB (ref 0.61–1.24)
Calcium: 8.4 mg/dL — ABNORMAL LOW (ref 8.9–10.3)
GFR calc Af Amer: >60 mL/min (ref >60)
GFR calc non Af Amer: >60 mL/min (ref >60)
Glucose, Bld: 174 mg/dL — ABNORMAL HIGH (ref 65–99)
Phosphorus: 3 mg/dL (ref 2.5–4.6)
Potassium: 3.1 mmol/L — ABNORMAL LOW (ref 3.5–5.1)
Sodium: 120 mmol/L — ABNORMAL LOW (ref 135–145)

## 2017-12-04 MED ORDER — PREDNISONE 20 MG PO TABS
40.0000 mg | ORAL_TABLET | Freq: Every day | ORAL | Status: DC
  Administered 2017-12-05: 40 mg via ORAL
  Filled 2017-12-04: qty 2

## 2017-12-04 MED ORDER — POTASSIUM CHLORIDE 10 MEQ/100ML IV SOLN
10.0000 meq | INTRAVENOUS | Status: DC
  Administered 2017-12-04: 10 meq via INTRAVENOUS
  Filled 2017-12-04: qty 100

## 2017-12-04 MED ORDER — SODIUM CHLORIDE 1 G PO TABS
1.0000 g | ORAL_TABLET | Freq: Two times a day (BID) | ORAL | Status: DC
  Administered 2017-12-04 – 2017-12-06 (×5): 1 g via ORAL
  Filled 2017-12-04 (×7): qty 1

## 2017-12-04 MED ORDER — IPRATROPIUM BROMIDE 0.02 % IN SOLN
0.5000 mg | Freq: Once | RESPIRATORY_TRACT | Status: AC
  Administered 2017-12-04: 0.5 mg via RESPIRATORY_TRACT

## 2017-12-04 MED ORDER — LEVALBUTEROL HCL 0.63 MG/3ML IN NEBU
0.6300 mg | INHALATION_SOLUTION | Freq: Once | RESPIRATORY_TRACT | Status: AC
  Administered 2017-12-04: 0.63 mg via RESPIRATORY_TRACT

## 2017-12-04 MED ORDER — POTASSIUM CHLORIDE IN NACL 40-0.9 MEQ/L-% IV SOLN
INTRAVENOUS | Status: DC
  Administered 2017-12-04 (×2): 75 mL/h via INTRAVENOUS

## 2017-12-04 NOTE — Progress Notes (Signed)
Dr. Darrick Meigs paged and made aware of pts K-3.1 and Na-120 this am. Waiting for orders/call back.

## 2017-12-04 NOTE — Progress Notes (Signed)
PROGRESS NOTE    Willie Williamson  MLJ:[id number] DOB: Apr 29, 1931 DOA: 11/30/2017 PCP: [name], MD    Brief Narrative:  84 year old male who was recently discharged from the hospital after being treated for MRSA bacteremia/endocarditis, return to the hospital with generalized weakness and poor urine output.  He was found to have significant hyponatremia with a sodium of 120.  Initially started on IV fluids, but now appears hypervolemic.  Nephrology following, he is currently on Lasix and salt tablets.  Continue to monitor sodium.  He is continued on vancomycin for recent bacteremia.  Plans are for skilled nursing facility placement once he is better.   Assessment & Plan:   Principal Problem:   Hyponatremia Active Problems:   Essential hypertension   COPD with emphysema (HCC)   Chronic respiratory failure with hypoxia (H[facility])   MRSA bacteremia   Infective endocarditis   1. Hyponatremia.  Initially felt to be hypovolemic.  Serum sodium 120 on admission.  Started on IV fluids with improvement of sodium to 126.  At this time, he appears to be hypervolemic with evidence of peripheral edema.  Nephrology consulted and started him on intravenous Lasix.  Continue with free water restriction.  Sodium has not significantly improved.  Started on salt tablets. 2. Recent MRSA bacteremia with endocarditis.  Continue outpatient regimen of vancomycin.  He will complete his vancomycin course on 12/30/17 3. Chronic respiratory failure with steroid dependent COPD.  Chronically on 2 L of oxygen.  More recently, reports needing 3 L of oxygen.  Overall wheezing is improving.  We will discontinue Solu-Medrol and start prednisone taper.  Continue nebulizer treatments. 4. Hypertension.  Resume home dose of Coreg 5. Hypothyroidism.  Continue on Synthroid 6. Possible urinary retention.  Foley catheter placed in the emergency room.  Staff had difficulty placing catheter and patient required coud catheter for  placement.  Continue on Flomax.  He has a history of BPH.  Can consider voiding trial prior to discharge. 7. Generalized weakness.  Plans are for skilled nursing facility placement on discharge   DVT prophylaxis: Lovenox Code Status: DNR Family Communication: No family present Disposition Plan: Skilled nursing facility placement on discharge.   Consultants:   Nephrology  Procedures:     Antimicrobials:   Vancomycin   Subjective: Feels that breathing is doing better today.  Wheezing is better.  Still feels very weak.  Objective: Vitals:   12/04/17 0201 12/04/17 0525 12/04/17 0844 12/04/17 0850  BP:  121/80    Pulse:  (!) 116    Resp:  20    Temp:  98.5 F (36.9 C)    TempSrc:  Oral    SpO2: 98% 100% 96% 96%  Weight:      Height:        Intake/Output Summary (Last 24 hours) at 12/04/2017 1658 Last data filed at 12/04/2017 0900 Gross per 24 hour  Intake 1920 ml  Output 2100 ml  Net -180 ml   Filed Weights   11/30/17 1810  Weight: 65.5 kg (144 lb 6.4 oz)    Examination:  General exam: Appears calm and comfortable  Respiratory system: Fair air entry bilaterally, clear bilaterally, respiratory effort normal Cardiovascular system: S1 & S2 heard, RRR. No JVD, murmurs, rubs, gallops or clicks. 1+ pedal edema, anasarca, scrotal edema. Gastrointestinal system: Abdomen is nondistended, soft and nontender. No organomegaly or masses felt. Normal bowel sounds heard. Central nervous system: Alert and oriented. No focal neurological deficits. Extremities: Symmetric 5 x 5 power. Skin:  No rashes, lesions or ulcers Psychiatry: Judgement and insight appear normal. Mood & affect appropriate.     Data Reviewed: I have personally reviewed following labs and imaging studies  CBC: Recent Labs  Lab 11/30/17 1242 12/01/17 0600 12/02/17 0555  WBC 14.8* 11.6* 9.1  NEUTROABS 13.6*  --   --   HGB 11.7* 10.7* 11.1*  HCT 34.1* 31.6* 31.6*  MCV 90.7 92.4 89.5  PLT 308 263 160    Basic Metabolic Panel: Recent Labs  Lab 11/30/17 1912 12/01/17 0600 12/02/17 0555 12/03/17 0447 12/04/17 0513  NA 124* 126* 124* 121* 120*  K 3.7 3.7 3.7 3.9 3.1*  CL 94* 94* 89* 86* 79*  CO2 19* 19* 24 24 31   GLUCOSE 69 56* 232* 156* 174*  BUN 16 13 12 16 19   CREATININE 0.51* 0.53* 0.57* 0.49* 0.58*  CALCIUM 7.2* 7.7* 8.4* 8.6* 8.4*  PHOS  --   --   --   --  3.0   GFR: Estimated Creatinine Clearance: 63.7 mL/min (A) (by C-G formula based on SCr of 0.58 mg/dL (L)). Liver Function Tests: Recent Labs  Lab 11/30/17 1242 12/03/17 0447 12/04/17 0513  AST 27 19  --   ALT 31 21  --   ALKPHOS 86 73  --   BILITOT 1.5* 1.1  --   PROT 5.9* 5.5*  --   ALBUMIN 3.1* 2.8* 2.8*   No results for input(s): LIPASE, AMYLASE in the last 168 hours. No results for input(s): AMMONIA in the last 168 hours. Coagulation Profile: Recent Labs  Lab 11/30/17 1242  INR 1.07   Cardiac Enzymes: No results for input(s): CKTOTAL, CKMB, CKMBINDEX, TROPONINI in the last 168 hours. BNP (last 3 results) No results for input(s): PROBNP in the last 8760 hours. HbA1C: No results for input(s): HGBA1C in the last 72 hours. CBG: Recent Labs  Lab 12/02/17 1634 12/02/17 2104 12/03/17 0753 12/03/17 2040 12/04/17 1504  GLUCAP 200* 195* 146* 139* 159*   Lipid Profile: No results for input(s): CHOL, HDL, LDLCALC, TRIG, CHOLHDL, LDLDIRECT in the last 72 hours. Thyroid Function Tests: No results for input(s): TSH, T4TOTAL, FREET4, T3FREE, THYROIDAB in the last 72 hours. Anemia Panel: No results for input(s): VITAMINB12, FOLATE, FERRITIN, TIBC, IRON, RETICCTPCT in the last 72 hours. Sepsis Labs: Recent Labs  Lab 11/30/17 1259  LATICACIDVEN 1.1    Recent Results (from the past 240 hour(s))  Culture, blood (Routine x 2)     Status: None (Preliminary result)   Collection Time: 11/30/17 12:42 PM  Result Value Ref Range Status   Specimen Description BLOOD PICC LINE  Final   Special Requests    Final    BOTTLES DRAWN AEROBIC AND ANAEROBIC Blood Culture adequate volume   Culture NO GROWTH 4 DAYS  Final   Report Status PENDING  Incomplete  Culture, blood (Routine x 2)     Status: None (Preliminary result)   Collection Time: 11/30/17 12:59 PM  Result Value Ref Range Status   Specimen Description BLOOD LEFT ARM  Final   Special Requests   Final    BOTTLES DRAWN AEROBIC AND ANAEROBIC Blood Culture adequate volume   Culture NO GROWTH 4 DAYS  Final   Report Status PENDING  Incomplete  Culture, Urine     Status: None   Collection Time: 11/30/17  9:40 PM  Result Value Ref Range Status   Specimen Description URINE, CLEAN CATCH  Final   Special Requests NONE  Final   Culture   Final  NO GROWTH Performed at [facility], [place] [address] [place], [place] [address]    Report Status 12/02/2017 FINAL  Final         Radiology Studies: US Venous Img Upper Uni Right  Result Date: 12/03/2017 CLINICAL DATA:  Right upper extremity bladder/right upper extremity edema for the past 3-4 days. History of right basilic approach upper extremity PICC line. History of bladder cancer. Evaluate for DVT. EXAM: RIGHT UPPER EXTREMITY VENOUS DOPPLER ULTRASOUND TECHNIQUE: Gray-scale sonography with graded compression, as well as color Doppler and duplex ultrasound were performed to evaluate the upper extremity deep venous system from the level of the subclavian vein and including the jugular, axillary, basilic, radial, ulnar and upper cephalic vein. Spectral Doppler was utilized to evaluate flow at rest and with distal augmentation maneuvers. COMPARISON:  None. FINDINGS: Contralateral Subclavian Vein: Respiratory phasicity is normal and symmetric with the symptomatic side. No evidence of thrombus. Normal compressibility. Internal Jugular Vein: No evidence of thrombus. Normal compressibility, respiratory phasicity and response to augmentation. Subclavian Vein: No evidence of thrombus. Normal  compressibility, respiratory phasicity and response to augmentation. A PICC line is seen within the right subclavian vein (images 10 and 19). Axillary Vein: No evidence of thrombus. Normal compressibility, respiratory phasicity and response to augmentation. A PICC line is seen within the right axillary vein (images 22 and 23). Cephalic Vein: No evidence of thrombus. Normal compressibility, respiratory phasicity and response to augmentation. A PICC line is seen within the right basilic vein (images 59 and 60). Basilic Vein: No evidence of thrombus. Normal compressibility, respiratory phasicity and response to augmentation. Brachial Veins: No evidence of thrombus. Normal compressibility, respiratory phasicity and response to augmentation. Radial Veins: No evidence of thrombus. Normal compressibility, respiratory phasicity and response to augmentation. Ulnar Veins: No evidence of thrombus. Normal compressibility, respiratory phasicity and response to augmentation. Venous Reflux:  None visualized. Other Findings: There is a moderate amount of subcutaneous edema at the level of the elbow and forearm (images 63 and 64). IMPRESSION: No evidence of DVT within the right upper extremity. Specifically, there is no evidence of DVT associated with the right basilic vein approach PICC line. Electronically Signed   By: Sandi Mariscal M.D.   On: 12/03/2017 13:53        Scheduled Meds: . carvedilol  6.25 mg Oral BID WC  . enoxaparin (LOVENOX) injection  40 mg Subcutaneous Q24H  . feeding supplement (ENSURE ENLIVE)  237 mL Oral BID BM  . furosemide  20 mg Intravenous BID  . guaiFENesin  1,200 mg Oral BID  . levothyroxine  88 mcg Oral QAC breakfast  . mometasone-formoterol  2 puff Inhalation BID  . pravastatin  20 mg Oral q1800  . [START ON 12/05/2017] predniSONE  40 mg Oral Q breakfast  . sodium chloride  1 g Oral BID WC  . tamsulosin  0.4 mg Oral QPC supper   Continuous Infusions: . 0.9 % NaCl with KCl 40 mEq / L 75  mL/hr (12/04/17 0914)  . vancomycin 1,000 mg (12/04/17 1450)     LOS: 4 days    Time spent: 46mins    Kathie Dike, MD [facility] Pager [phone]  If 7PM-7AM, please contact night-coverage [email or web address] Password [facility] 12/04/2017, 4:58 PM

## 2017-12-04 NOTE — Progress Notes (Signed)
New order for Xopenex and Atrovent treatment x1 per Dr. Hilbert Bible. RT made aware.

## 2017-12-04 NOTE — Progress Notes (Signed)
Pt RR=26/min, HR 109, pt wheezing. RT aware, Dr. Hilbert Bible paged and made aware. Will continue to monitor pt

## 2017-12-04 NOTE — Progress Notes (Signed)
Subjective: Interval History: has complaints difficulty breathing.  However he says that he is slightly getting better.  He has cough but no sputum production..  Objective: Vital signs in last 24 hours: Temp:  [97.4 F (36.3 C)-98.5 F (36.9 C)] 98.5 F (36.9 C) (01/15 0525) Pulse Rate:  [107-116] 116 (01/15 0525) Resp:  [20] 20 (01/15 0525) BP: (108-121)/(70-80) 121/80 (01/15 0525) SpO2:  [98 %-100 %] 100 % (01/15 0525) Weight change:   Intake/Output from previous day: 01/14 0701 - 01/15 0700 In: 2040 [P.O.:1040; IV Piggyback:1000] Out: 3400 [Urine:3400] Intake/Output this shift: No intake/output data recorded.  General appearance: alert, cooperative and no distress Resp: diminished breath sounds bilaterally Cardio: regular rate and rhythm Extremities: edema Trace to 1+ edema  Lab Results: Recent Labs    12/02/17 0555  WBC 9.1  HGB 11.1*  HCT 31.6*  PLT 274   BMET:  Recent Labs    12/03/17 0447 12/04/17 0513  NA 121* 120*  K 3.9 3.1*  CL 86* 79*  CO2 24 31  GLUCOSE 156* 174*  BUN 16 19  CREATININE 0.49* 0.58*  CALCIUM 8.6* 8.4*   No results for input(s): PTH in the last 72 hours. Iron Studies: No results for input(s): IRON, TIBC, TRANSFERRIN, FERRITIN in the last 72 hours.  Studies/Results: US Venous Img Upper Uni Right  Result Date: 12/03/2017 CLINICAL DATA:  Right upper extremity bladder/right upper extremity edema for the past 3-4 days. History of right basilic approach upper extremity PICC line. History of bladder cancer. Evaluate for DVT. EXAM: RIGHT UPPER EXTREMITY VENOUS DOPPLER ULTRASOUND TECHNIQUE: Gray-scale sonography with graded compression, as well as color Doppler and duplex ultrasound were performed to evaluate the upper extremity deep venous system from the level of the subclavian vein and including the jugular, axillary, basilic, radial, ulnar and upper cephalic vein. Spectral Doppler was utilized to evaluate flow at rest and with distal  augmentation maneuvers. COMPARISON:  None. FINDINGS: Contralateral Subclavian Vein: Respiratory phasicity is normal and symmetric with the symptomatic side. No evidence of thrombus. Normal compressibility. Internal Jugular Vein: No evidence of thrombus. Normal compressibility, respiratory phasicity and response to augmentation. Subclavian Vein: No evidence of thrombus. Normal compressibility, respiratory phasicity and response to augmentation. A PICC line is seen within the right subclavian vein (images 10 and 19). Axillary Vein: No evidence of thrombus. Normal compressibility, respiratory phasicity and response to augmentation. A PICC line is seen within the right axillary vein (images 22 and 23). Cephalic Vein: No evidence of thrombus. Normal compressibility, respiratory phasicity and response to augmentation. A PICC line is seen within the right basilic vein (images 59 and 60). Basilic Vein: No evidence of thrombus. Normal compressibility, respiratory phasicity and response to augmentation. Brachial Veins: No evidence of thrombus. Normal compressibility, respiratory phasicity and response to augmentation. Radial Veins: No evidence of thrombus. Normal compressibility, respiratory phasicity and response to augmentation. Ulnar Veins: No evidence of thrombus. Normal compressibility, respiratory phasicity and response to augmentation. Venous Reflux:  None visualized. Other Findings: There is a moderate amount of subcutaneous edema at the level of the elbow and forearm (images 63 and 64). IMPRESSION: No evidence of DVT within the right upper extremity. Specifically, there is no evidence of DVT associated with the right basilic vein approach PICC line. Electronically Signed   By: Sandi Mariscal M.D.   On: 12/03/2017 13:53    I have reviewed the patient's current medications.  Assessment/Plan: 1] hyponatremia: This is secondary to hypervolemic hyponatremia.  His sodium is 120 remains  low.  Patient at this moment  however is a symptomatic. 2] hypokalemia: Most likely due to diuretics. 3] CHF: Patient is on Lasix.  He had about 3400 cc of urine output.  Patient still has some difficulty breathing but is getting better. 4] history of COPD: Patient is on a steroid and albuterol inhaler. 5] hypertension: His blood pressure is reasonably controlled 6] coronary artery disease: Patient presently does not have any chest pain 7] history of bladder cancer. Plan: We will start patient on normal saline with 40 mEq of KCl at 75 cc/h 2] we will continue his Lasix 3] we will start patient on sodium chloride 1 g p.o. twice daily 4] we will check his renal panel in the morning.     LOS: 4 days   Allisa Einspahr S 12/04/2017,8:27 AM

## 2017-12-05 ENCOUNTER — Inpatient Hospital Stay: Payer: Medicare HMO | Admitting: Infectious Disease

## 2017-12-05 ENCOUNTER — Inpatient Hospital Stay (HOSPITAL_COMMUNITY): Payer: Medicare HMO

## 2017-12-05 LAB — RENAL FUNCTION PANEL
ALBUMIN: 2.7 g/dL — AB (ref 3.5–5.0)
Anion gap: 9 (ref 5–15)
BUN: 18 mg/dL (ref 6–20)
CALCIUM: 8 mg/dL — AB (ref 8.9–10.3)
CHLORIDE: 83 mmol/L — AB (ref 101–111)
CO2: 28 mmol/L (ref 22–32)
CREATININE: 0.59 mg/dL — AB (ref 0.61–1.24)
GFR calc Af Amer: >60 mL/min (ref >60)
Glucose, Bld: 129 mg/dL — ABNORMAL HIGH (ref 65–99)
PHOSPHORUS: 2.2 mg/dL — AB (ref 2.5–4.6)
Potassium: 3.6 mmol/L (ref 3.5–5.1)
SODIUM: 120 mmol/L — AB (ref 135–145)

## 2017-12-05 LAB — BASIC METABOLIC PANEL
ANION GAP: 10 (ref 5–15)
BUN: 18 mg/dL (ref 6–20)
CO2: 27 mmol/L (ref 22–32)
Calcium: 7.9 mg/dL — ABNORMAL LOW (ref 8.9–10.3)
Chloride: 83 mmol/L — ABNORMAL LOW (ref 101–111)
Creatinine, Ser: 0.55 mg/dL — ABNORMAL LOW (ref 0.61–1.24)
GLUCOSE: 130 mg/dL — AB (ref 65–99)
POTASSIUM: 3.6 mmol/L (ref 3.5–5.1)
Sodium: 120 mmol/L — ABNORMAL LOW (ref 135–145)

## 2017-12-05 LAB — CULTURE, BLOOD (ROUTINE X 2)
CULTURE: NO GROWTH
CULTURE: NO GROWTH
SPECIAL REQUESTS: ADEQUATE
Special Requests: ADEQUATE

## 2017-12-05 LAB — GLUCOSE, CAPILLARY
GLUCOSE-CAPILLARY: 99 mg/dL (ref 65–99)
GLUCOSE-CAPILLARY: 167 mg/dL — AB (ref 65–99)
Glucose-Capillary: 104 mg/dL — ABNORMAL HIGH (ref 65–99)
Glucose-Capillary: 148 mg/dL — ABNORMAL HIGH (ref 65–99)

## 2017-12-05 LAB — OSMOLALITY, URINE: Osmolality, Ur: 305 mOsm/kg (ref 300–900)

## 2017-12-05 LAB — SODIUM, URINE, RANDOM: Sodium, Ur: 87 mmol/L

## 2017-12-05 MED ORDER — METHYLPREDNISOLONE SODIUM SUCC 125 MG IJ SOLR
60.0000 mg | Freq: Two times a day (BID) | INTRAMUSCULAR | Status: DC
  Administered 2017-12-05 – 2017-12-07 (×6): 60 mg via INTRAVENOUS
  Filled 2017-12-05 (×6): qty 2

## 2017-12-05 MED ORDER — LEVALBUTEROL HCL 0.63 MG/3ML IN NEBU
0.6300 mg | INHALATION_SOLUTION | RESPIRATORY_TRACT | Status: DC
  Administered 2017-12-05 – 2017-12-09 (×22): 0.63 mg via RESPIRATORY_TRACT
  Filled 2017-12-05 (×23): qty 3

## 2017-12-05 MED ORDER — IPRATROPIUM BROMIDE 0.02 % IN SOLN
0.5000 mg | RESPIRATORY_TRACT | Status: DC
Start: 2017-12-05 — End: 2017-12-09
  Administered 2017-12-05 – 2017-12-09 (×22): 0.5 mg via RESPIRATORY_TRACT
  Filled 2017-12-05 (×23): qty 2.5

## 2017-12-05 MED ORDER — FUROSEMIDE 10 MG/ML IJ SOLN
40.0000 mg | Freq: Two times a day (BID) | INTRAMUSCULAR | Status: DC
  Administered 2017-12-05 – 2017-12-06 (×2): 40 mg via INTRAVENOUS
  Filled 2017-12-05 (×2): qty 4

## 2017-12-05 NOTE — Progress Notes (Signed)
Pt c/o that he is having trouble breathing. O2 sats 98%, RR-20. Pt continues to have non-productive weak cough. RT called and made aware pt requesting treatment. Will continue to monitor

## 2017-12-05 NOTE — Progress Notes (Signed)
PROGRESS NOTE    CLEMON Williamson  VVO:[id number] DOB: [birth year] DOA: 11/30/2017 PCP: [name], MD    Brief Narrative:  84 year old male who was recently discharged from the hospital after being treated for MRSA bacteremia/endocarditis, return to the hospital with generalized weakness and poor urine output.  He was found to have significant hyponatremia with a sodium of 120.  Initially started on IV fluids, but now appears hypervolemic.  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 He is continued on vancomycin for recent bacteremia.  Plans are for skilled nursing facility placement once he is better.   Assessment & Plan:   Principal Problem:   Hyponatremia Active Problems:   Essential hypertension   COPD with emphysema (HCC)   Chronic respiratory failure with hypoxia (HCC)   MRSA bacteremia   Infective endocarditis   1. Hypervolemic hyponatremia with SIADH.  Appreciate nephrology recommendations.  Discontinue IV fluid and increase Lasix dose to 40 mg IV twice daily.  Additionally, fluid restriction of 1200 cc/day added as serum sodium has not increased. 2. Recent MRSA bacteremia with endocarditis.  Continue outpatient regimen of vancomycin.  He will complete his vancomycin course on 12/30/17 3. Chronic respiratory failure with steroid dependent COPD.  With worsening dyspnea, repeat chest x-ray performed with no acute findings.  Chronically on 2 L of oxygen.  More recently, reports needing 3 L of oxygen.  Overall wheezing is improving.  Patient is currently on prednisone, but will place back on IV solu-medrol today.  Continue nebulizer treatments. 4. Hypertension.  Resume home dose of Coreg 5. Hypothyroidism.  Continue on Synthroid 6. Possible urinary retention.  Foley catheter placed in the emergency room.  Staff had difficulty placing catheter and patient required coud catheter for placement.  Continue on Flomax.   He has a history of BPH.  Can consider voiding trial prior to discharge in next 1-2 days.  He currently requires Foley catheter for strict I's and O's. 7. Generalized weakness.  Plans are for skilled nursing facility placement on discharge   DVT prophylaxis: Lovenox Code Status: DNR Family Communication: No family present Disposition Plan: Skilled nursing facility placement on discharge.   Consultants:   Nephrology  Procedures:   None  Antimicrobials:   Vancomycin   Subjective: Feels that he is dyspneic and is currently requiring a breathing treatment. No acute overnight events noted.  Objective: Vitals:   12/05/17 0405 12/05/17 0605 12/05/17 0818 12/05/17 0827  BP: (!) 145/75     Pulse: (!) 112     Resp: 18     Temp: 97.7 F (36.5 C)     TempSrc: Oral     SpO2: 96% 99% 99% 99%  Weight:      Height:        Intake/Output Summary (Last 24 hours) at 12/05/2017 1112 Last data filed at 12/05/2017 0310 Gross per 24 hour  Intake 1825 ml  Output 500 ml  Net 1325 ml   Filed Weights   11/30/17 1810  Weight: 65.5 kg (144 lb 6.4 oz)    Examination:  General exam: Appears calm and comfortable  Respiratory system: diminished air entry bilaterally, clear bilaterally, respiratory effort normal; on 3L [place] Cardiovascular system: S1 & S2 heard, RRR. No JVD, murmurs, rubs, gallops or clicks. 1+ pedal edema, anasarca, scrotal edema. Gastrointestinal system: Abdomen is nondistended, soft and nontender. No organomegaly or masses felt. Normal bowel sounds heard. Central nervous system: Alert and  oriented. No focal neurological deficits. Extremities: Symmetric 5 x 5 power. Skin: No rashes, lesions or ulcers Psychiatry: Judgement and insight appear normal. Mood & affect appropriate.     Data Reviewed: I have personally reviewed following labs and imaging studies  CBC: Recent Labs  Lab 11/30/17 1242 12/01/17 0600 12/02/17 0555  WBC 14.8* 11.6* 9.1  NEUTROABS 13.6*  --   --    HGB 11.7* 10.7* 11.1*  HCT 34.1* 31.6* 31.6*  MCV 90.7 92.4 89.5  PLT 308 263 009   Basic Metabolic Panel: Recent Labs  Lab 12/01/17 0600 12/02/17 0555 12/03/17 0447 12/04/17 0513 12/05/17 0540  NA 126* 124* 121* 120* 120*  120*  K 3.7 3.7 3.9 3.1* 3.6  3.6  CL 94* 89* 86* 79* 83*  83*  CO2 19* 24 24 31 28  27   GLUCOSE 56* 232* 156* 174* 129*  130*  BUN 13 12 16 19 18  18   CREATININE 0.53* 0.57* 0.49* 0.58* 0.59*  0.55*  CALCIUM 7.7* 8.4* 8.6* 8.4* 8.0*  7.9*  PHOS  --   --   --  3.0 2.2*   GFR: Estimated Creatinine Clearance: 63.7 mL/min (A) (by C-G formula based on SCr of 0.55 mg/dL (L)). Liver Function Tests: Recent Labs  Lab 11/30/17 1242 12/03/17 0447 12/04/17 0513 12/05/17 0540  AST 27 19  --   --   ALT 31 21  --   --   ALKPHOS 86 73  --   --   BILITOT 1.5* 1.1  --   --   PROT 5.9* 5.5*  --   --   ALBUMIN 3.1* 2.8* 2.8* 2.7*   No results for input(s): LIPASE, AMYLASE in the last 168 hours. No results for input(s): AMMONIA in the last 168 hours. Coagulation Profile: Recent Labs  Lab 11/30/17 1242  INR 1.07   Cardiac Enzymes: No results for input(s): CKTOTAL, CKMB, CKMBINDEX, TROPONINI in the last 168 hours. BNP (last 3 results) No results for input(s): PROBNP in the last 8760 hours. HbA1C: No results for input(s): HGBA1C in the last 72 hours. CBG: Recent Labs  Lab 12/03/17 2040 12/04/17 1504 12/04/17 1707 12/04/17 2158 12/05/17 0759  GLUCAP 139* 159* 186* 142* 99   Lipid Profile: No results for input(s): CHOL, HDL, LDLCALC, TRIG, CHOLHDL, LDLDIRECT in the last 72 hours. Thyroid Function Tests: No results for input(s): TSH, T4TOTAL, FREET4, T3FREE, THYROIDAB in the last 72 hours. Anemia Panel: No results for input(s): VITAMINB12, FOLATE, FERRITIN, TIBC, IRON, RETICCTPCT in the last 72 hours. Sepsis Labs: Recent Labs  Lab 11/30/17 1259  LATICACIDVEN 1.1    Recent Results (from the past 240 hour(s))  Culture, blood (Routine x  2)     Status: None   Collection Time: 11/30/17 12:42 PM  Result Value Ref Range Status   Specimen Description BLOOD PICC LINE  Final   Special Requests   Final    BOTTLES DRAWN AEROBIC AND ANAEROBIC Blood Culture adequate volume   Culture NO GROWTH 5 DAYS  Final   Report Status 12/05/2017 FINAL  Final  Culture, blood (Routine x 2)     Status: None   Collection Time: 11/30/17 12:59 PM  Result Value Ref Range Status   Specimen Description BLOOD LEFT ARM  Final   Special Requests   Final    BOTTLES DRAWN AEROBIC AND ANAEROBIC Blood Culture adequate volume   Culture NO GROWTH 5 DAYS  Final   Report Status 12/05/2017 FINAL  Final  Culture, Urine  Status: None   Collection Time: 11/30/17  9:40 PM  Result Value Ref Range Status   Specimen Description URINE, CLEAN CATCH  Final   Special Requests NONE  Final   Culture   Final    NO GROWTH Performed at [facility], [address] [place], [place] [address]    Report Status 12/02/2017 FINAL  Final         Radiology Studies: US Venous Img Upper Uni Right  Result Date: 12/03/2017 CLINICAL DATA:  Right upper extremity bladder/right upper extremity edema for the past 3-4 days. History of right basilic approach upper extremity PICC line. History of bladder cancer. Evaluate for DVT. EXAM: RIGHT UPPER EXTREMITY VENOUS DOPPLER ULTRASOUND TECHNIQUE: Gray-scale sonography with graded compression, as well as color Doppler and duplex ultrasound were performed to evaluate the upper extremity deep venous system from the level of the subclavian vein and including the jugular, axillary, basilic, radial, ulnar and upper cephalic vein. Spectral Doppler was utilized to evaluate flow at rest and with distal augmentation maneuvers. COMPARISON:  None. FINDINGS: Contralateral Subclavian Vein: Respiratory phasicity is normal and symmetric with the symptomatic side. No evidence of thrombus. Normal compressibility. Internal Jugular Vein: No evidence of  thrombus. Normal compressibility, respiratory phasicity and response to augmentation. Subclavian Vein: No evidence of thrombus. Normal compressibility, respiratory phasicity and response to augmentation. A PICC line is seen within the right subclavian vein (images 10 and 19). Axillary Vein: No evidence of thrombus. Normal compressibility, respiratory phasicity and response to augmentation. A PICC line is seen within the right axillary vein (images 22 and 23). Cephalic Vein: No evidence of thrombus. Normal compressibility, respiratory phasicity and response to augmentation. A PICC line is seen within the right basilic vein (images 59 and 60). Basilic Vein: No evidence of thrombus. Normal compressibility, respiratory phasicity and response to augmentation. Brachial Veins: No evidence of thrombus. Normal compressibility, respiratory phasicity and response to augmentation. Radial Veins: No evidence of thrombus. Normal compressibility, respiratory phasicity and response to augmentation. Ulnar Veins: No evidence of thrombus. Normal compressibility, respiratory phasicity and response to augmentation. Venous Reflux:  None visualized. Other Findings: There is a moderate amount of subcutaneous edema at the level of the elbow and forearm (images 63 and 64). IMPRESSION: No evidence of DVT within the right upper extremity. Specifically, there is no evidence of DVT associated with the right basilic vein approach PICC line. Electronically Signed   By: Sandi Mariscal M.D.   On: 12/03/2017 13:53   Dg Chest Port 1 View  Result Date: 12/05/2017 CLINICAL DATA:  Cough with tachycardia EXAM: PORTABLE CHEST 1 VIEW COMPARISON:  Chest radiograph and chest CT November 30, 2017 FINDINGS: Central catheter tip is in the superior vena cava near the cavoatrial junction. No evident pneumothorax. There is minimal atelectasis in the right base. The lungs elsewhere are clear. Heart size and pulmonary vascular normal. No adenopathy. There is aortic  atherosclerosis. There is degenerative change in each shoulder. IMPRESSION: Slight right base atelectasis. No edema or consolidation. Stable cardiac silhouette. There is aortic atherosclerosis. Central catheter as described without pneumothorax. Aortic Atherosclerosis (ICD10-I70.0). Electronically Signed   By: Lowella Grip III M.D.   On: 12/05/2017 10:29        Scheduled Meds: . carvedilol  6.25 mg Oral BID WC  . enoxaparin (LOVENOX) injection  40 mg Subcutaneous Q24H  . feeding supplement (ENSURE ENLIVE)  237 mL Oral BID BM  . furosemide  40 mg Intravenous BID  . guaiFENesin  1,200  mg Oral BID  . ipratropium  0.5 mg Nebulization Q4H  . levalbuterol  0.63 mg Nebulization Q4H  . levothyroxine  88 mcg Oral QAC breakfast  . mometasone-formoterol  2 puff Inhalation BID  . pravastatin  20 mg Oral q1800  . predniSONE  40 mg Oral Q breakfast  . sodium chloride  1 g Oral BID WC  . tamsulosin  0.4 mg Oral QPC supper   Continuous Infusions: . vancomycin Stopped (12/05/17 0248)     LOS: 5 days    Time spent: 70mins    Isabela Nardelli Darleen Crocker, DO [facility] Pager [phone]  If 7PM-7AM, please contact night-coverage [email or web address] Password [email or web address] 12/05/2017, 11:12 AM

## 2017-12-05 NOTE — Progress Notes (Signed)
Physical Therapy Treatment Patient Details Name: Willie Williamson MRN: [id number] DOB: Nov 26, 1930 Today's Date: 12/05/2017    History of Present Illness Willie Williamson is a 84 y.o. male with medical history significant for recent 6 day admission and discharge for MRSA bacteremia and endocarditis, 11/16/17- 11/22/17.,  Also history of COPD, Bladder cancer, BPH.      PT Comments    Pt laying in bed upon arrival.  Pt refusing intially as requesting to only complete activity following breathing treatment.  Pt willing to sit EOB and complete therex.  Pt on 3LO2.  Little assist required for basic bed mobility but with noted SOB with minimal actvity.  Cues for diaphragmatic breathing throughout session.  Pt completed LE therex with cues to complete more slowly.  Pt able to return to supine with cues and min assist.  Pt willing to attempt ambulation tomorrow if therapist can coordinate with  Respiratory.  Informed primary acute therapist of this Jeneen Rinks).  Pt in bed with HOB elevated to 40 degrees.  Follow Up Recommendations        Equipment Recommendations       Recommendations for Other Services       Precautions / Restrictions Precautions Precautions: Fall Precaution Comments: fragile weepy skin RUE Restrictions Weight Bearing Restrictions: No    Mobility  Bed Mobility Overal bed mobility: Needs Assistance Bed Mobility: Supine to Sit;Sit to Supine     Supine to sit: Supervision Sit to supine: Min guard   General bed mobility comments: demonstrates slow labored movement  Transfers Overall transfer level: Needs assistance Equipment used: None Transfers: Sit to/from Stand Sit to Stand: Mod assist            Ambulation/Gait                 Stairs            Wheelchair Mobility    Modified Rankin (Stroke Patients Only)       Balance                                            Cognition Arousal/Alertness: Awake/alert Behavior During  Therapy: WFL for tasks assessed/performed Overall Cognitive Status: Within Functional Limits for tasks assessed                                 General Comments: Pt states he had a bad day yesterday.  STates he cannot walk unless he gets his breathing treatment.      Exercises General Exercises - Lower Extremity Ankle Circles/Pumps: AROM;15 reps;Both;Seated Long Arc Quad: AROM;Both;10 reps;Seated    General Comments        Pertinent Vitals/Pain Pain Assessment: No/denies pain    Home Living                      Prior Function            PT Goals (current goals can now be found in the care plan section)      Frequency           PT Plan Current plan remains appropriate    Co-evaluation              AM-PAC PT "6 Clicks" Daily Activity  Outcome Measure  End of Session Equipment Utilized During Treatment: Oxygen Activity Tolerance: Patient limited by fatigue Patient left: in bed         Time: 1500-1520 PT Time Calculation (min) (ACUTE ONLY): 20 min  Charges:  $Therapeutic Exercise: 8-22 mins                       Teena Irani, PTA/CLT [place], Phylicia Mcgaugh B 12/05/2017, 3:29 PM

## 2017-12-05 NOTE — Progress Notes (Signed)
After receiving Breathing treatments, pt no longer wheezing. Pt states he doesn't feel short of breath like he did before, RR=18. Will continue to monitor  pt

## 2017-12-05 NOTE — Progress Notes (Signed)
Willie Williamson  MRN: [id number]  DOB/AGE: 1931-04-25 84 y.o.  Primary Care Physician:Burdine, Virgina Evener, MD  Admit date: 11/30/2017  Chief Complaint:  Chief Complaint  Patient presents with  . Tachycardia    S-Pt presented on  11/30/2017 with  Chief Complaint  Patient presents with  . Tachycardia  .    Pt offers no  New complaints " I am still short of breath, nothing else"    Meds . carvedilol  6.25 mg Oral BID WC  . enoxaparin (LOVENOX) injection  40 mg Subcutaneous Q24H  . feeding supplement (ENSURE ENLIVE)  237 mL Oral BID BM  . furosemide  20 mg Intravenous BID  . guaiFENesin  1,200 mg Oral BID  . ipratropium  0.5 mg Nebulization Q4H  . levalbuterol  0.63 mg Nebulization Q4H  . levothyroxine  88 mcg Oral QAC breakfast  . mometasone-formoterol  2 puff Inhalation BID  . pravastatin  20 mg Oral q1800  . predniSONE  40 mg Oral Q breakfast  . sodium chloride  1 g Oral BID WC  . tamsulosin  0.4 mg Oral QPC supper       Physical Exam: Vital signs in last 24 hours: Temp:  [97.6 F (36.4 C)-97.7 F (36.5 C)] 97.7 F (36.5 C) (01/16 0405) Pulse Rate:  [112-118] 112 (01/16 0405) Resp:  [18-20] 18 (01/16 0405) BP: (140-145)/(68-75) 145/75 (01/16 0405) SpO2:  [96 %-99 %] 99 % (01/16 0827) Weight change:  Last BM Date: 11/29/17  Intake/Output from previous day: 01/15 0701 - 01/16 0700 In: 2065 [P.O.:600; I.V.:1345; IV Piggyback:120] Out: 500 [Urine:500] No intake/output data recorded.   Physical Exam: General- pt is awake,alert, oriented to time place and person Resp- No acute REsp distress,  Rhonchi+ CVS- S1S2 regular in rate and rhythm GIT- BS+, soft, NT, ND EXT- 1+ LE Edema, Cyanosis   Lab Results: HGb 11.1  BMET Recent Labs    12/04/17 0513 12/05/17 0540  NA 120* 120*  120*  K 3.1* 3.6  3.6  CL 79* 83*  83*  CO2 31 28  27   GLUCOSE 174* 129*  130*  BUN 19 18  18   CREATININE 0.58* 0.59*  0.55*  CALCIUM 8.4* 8.0*  7.9*   Sodium 2019  120=> 126=> 120  MICRO Recent Results (from the past 240 hour(s))  Culture, blood (Routine x 2)     Status: None   Collection Time: 11/30/17 12:42 PM  Result Value Ref Range Status   Specimen Description BLOOD PICC LINE  Final   Special Requests   Final    BOTTLES DRAWN AEROBIC AND ANAEROBIC Blood Culture adequate volume   Culture NO GROWTH 5 DAYS  Final   Report Status 12/05/2017 FINAL  Final  Culture, blood (Routine x 2)     Status: None   Collection Time: 11/30/17 12:59 PM  Result Value Ref Range Status   Specimen Description BLOOD LEFT ARM  Final   Special Requests   Final    BOTTLES DRAWN AEROBIC AND ANAEROBIC Blood Culture adequate volume   Culture NO GROWTH 5 DAYS  Final   Report Status 12/05/2017 FINAL  Final  Culture, Urine     Status: None   Collection Time: 11/30/17  9:40 PM  Result Value Ref Range Status   Specimen Description URINE, CLEAN CATCH  Final   Special Requests NONE  Final   Culture   Final    NO GROWTH Performed at [facility], [address],  [place] [address]    Report Status 12/02/2017 FINAL  Final      Lab Results  Component Value Date   CALCIUM 7.9 (L) 12/05/2017   CALCIUM 8.0 (L) 12/05/2017   PHOS 2.2 (L) 12/05/2017         Impression: 1)Hyponatremia        Hypovolemic at the time of presentataion        Now pt had hypervolemia vs SIADH        On Diuretics and free water restriction   2)HTN  Medication-  On Diuretics On Alpha and beta Blockers   3)Anemia HGb at goal (9--11)  4)CHF PT is on diurtics  5)COPD Primary MD following  6)Hypokalemic Sec to diuretics Now better  7)Acid base Co2 at goal    Plan:  Will ask for urine osmolarity Will continue lasix + salt . Will ask for fluid restriction  Will d/c ivf     Choice Kleinsasser S 12/05/2017, 9:06 AM

## 2017-12-06 LAB — CBC
HEMATOCRIT: 31.5 % — AB (ref 39.0–52.0)
HEMOGLOBIN: 10.9 g/dL — AB (ref 13.0–17.0)
MCH: 31.1 pg (ref 26.0–34.0)
MCHC: 34.6 g/dL (ref 30.0–36.0)
MCV: 90 fL (ref 78.0–100.0)
Platelets: 203 10*3/uL (ref 150–400)
RBC: 3.5 MIL/uL — AB (ref 4.22–5.81)
RDW: 13.1 % (ref 11.5–15.5)
WBC: 11.6 10*3/uL — ABNORMAL HIGH (ref 4.0–10.5)

## 2017-12-06 LAB — MAGNESIUM: MAGNESIUM: 1.7 mg/dL (ref 1.7–2.4)

## 2017-12-06 LAB — GLUCOSE, CAPILLARY
GLUCOSE-CAPILLARY: 166 mg/dL — AB (ref 65–99)
Glucose-Capillary: 164 mg/dL — ABNORMAL HIGH (ref 65–99)
Glucose-Capillary: 204 mg/dL — ABNORMAL HIGH (ref 65–99)
Glucose-Capillary: 152 mg/dL — ABNORMAL HIGH (ref 65–99)

## 2017-12-06 LAB — BASIC METABOLIC PANEL
Anion gap: 10 (ref 5–15)
BUN: 16 mg/dL (ref 6–20)
CHLORIDE: 82 mmol/L — AB (ref 101–111)
CO2: 31 mmol/L (ref 22–32)
CREATININE: 0.58 mg/dL — AB (ref 0.61–1.24)
Calcium: 8.1 mg/dL — ABNORMAL LOW (ref 8.9–10.3)
GFR calc Af Amer: >60 mL/min (ref >60)
GFR calc non Af Amer: >60 mL/min (ref >60)
Glucose, Bld: 161 mg/dL — ABNORMAL HIGH (ref 65–99)
POTASSIUM: 3 mmol/L — AB (ref 3.5–5.1)
SODIUM: 123 mmol/L — AB (ref 135–145)

## 2017-12-06 MED ORDER — POLYETHYLENE GLYCOL 3350 17 G PO PACK
17.0000 g | PACK | Freq: Once | ORAL | Status: AC
  Administered 2017-12-06: 17 g via ORAL
  Filled 2017-12-06: qty 1

## 2017-12-06 MED ORDER — FUROSEMIDE 10 MG/ML IJ SOLN
20.0000 mg | Freq: Two times a day (BID) | INTRAMUSCULAR | Status: DC
  Administered 2017-12-06 – 2017-12-11 (×10): 20 mg via INTRAVENOUS
  Filled 2017-12-06 (×10): qty 2

## 2017-12-06 MED ORDER — POLYETHYLENE GLYCOL 3350 17 GM/SCOOP PO POWD
1.0000 | Freq: Once | ORAL | Status: DC
  Filled 2017-12-06: qty 255

## 2017-12-06 MED ORDER — POTASSIUM CHLORIDE 10 MEQ/100ML IV SOLN
10.0000 meq | INTRAVENOUS | Status: AC
  Administered 2017-12-06 (×6): 10 meq via INTRAVENOUS

## 2017-12-06 MED ORDER — SODIUM CHLORIDE 1 G PO TABS
1.0000 g | ORAL_TABLET | Freq: Three times a day (TID) | ORAL | Status: DC
  Administered 2017-12-06 – 2017-12-11 (×16): 1 g via ORAL
  Filled 2017-12-06 (×19): qty 1

## 2017-12-06 MED ORDER — POTASSIUM CHLORIDE 10 MEQ/100ML IV SOLN
INTRAVENOUS | Status: AC
  Filled 2017-12-06: qty 600

## 2017-12-06 NOTE — Progress Notes (Signed)
Both arms are weeping fluid, Gauze dressing's applied and changed twice on day shift. Will continue to monitor.

## 2017-12-06 NOTE — Progress Notes (Signed)
PROGRESS NOTE    Willie Williamson  FXT:[id number] DOB: 05-27-1931 DOA: 11/30/2017 PCP: [name], MD    Brief Narrative:  84 year old male who was recently discharged from the hospital after being treated for MRSA bacteremia/endocarditis, return to the hospital with generalized weakness and poor urine output.  He was found to have significant hyponatremia with a sodium of 120.  Initially started on IV fluids, but now appears hypervolemic.  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  He has had significant hypokalemia due to diuresis that is being aggressively repleted.  He is continued on vancomycin for recent bacteremia.  He continues to have ongoing shortness of breath likely related to his COPD for which she has been placed back on IV steroids.  Plans are for skilled nursing facility placement once he is better.   Assessment & Plan:   Principal Problem:   Hyponatremia Active Problems:   Essential hypertension   COPD with emphysema (HCC)   Chronic respiratory failure with hypoxia (HCC)   MRSA bacteremia   Infective endocarditis   1. Hypervolemic hyponatremia with SIADH improved from 120->123 today.  Appreciate nephrology recommendations.  Decrease Lasix dose to 20 mg IV twice daily.  Additionally, fluid restriction of 1200 cc/day. 2. Hypokalemia. Replete today. 3. Recent MRSA bacteremia with endocarditis.  Continue outpatient regimen of vancomycin.  He will complete his vancomycin course on 12/30/17 4. Chronic respiratory failure with steroid dependent COPD.  With worsening dyspnea, repeat chest x-ray performed with no acute findings.  Chronically on 2 L of oxygen.  More recently, reports needing 3 L of oxygen.  Overall wheezing is improving. Continue IV Solumedrol. Continue nebulizer treatments. Pulmicort BID. Up to chair. 5. Hypertension.  Resume home dose of Coreg 6. Hypothyroidism.  Continue on  Synthroid 7. Possible urinary retention.  Foley catheter placed in the emergency room.  Staff had difficulty placing catheter and patient required coud catheter for placement.  Continue on Flomax.  He has a history of BPH.  Can consider voiding trial prior to discharge in next 1-2 days.  He currently requires Foley catheter for strict I's and O's. 8. Generalized weakness.  Plans are for skilled nursing facility placement on discharge   DVT prophylaxis: Lovenox Code Status: DNR Family Communication: Will discuss with daughter-in-law Disposition Plan: Skilled nursing facility placement on discharge.   Consultants:   Nephrology  Procedures:   None  Antimicrobials:   Vancomycin   Subjective: Feels that he is still dyspneic and is currently requiring a breathing treatment. No acute overnight events noted.  Objective: Vitals:   12/06/17 0126 12/06/17 0329 12/06/17 0836 12/06/17 0908  BP:  (!) 124/93    Pulse:  95    Resp:  18    Temp:  98 F (36.7 C)    TempSrc:  Oral    SpO2: 96% 100% 99% 100%  Weight:      Height:        Intake/Output Summary (Last 24 hours) at 12/06/2017 1048 Last data filed at 12/06/2017 0118 Gross per 24 hour  Intake 200 ml  Output 2750 ml  Net -2550 ml   Filed Weights   11/30/17 1810  Weight: 65.5 kg (144 lb 6.4 oz)    Examination:  General exam: Appears calm and comfortable  Respiratory system: diminished air entry bilaterally,respiratory effort normal; on 3L  Cardiovascular system: S1 & S2 heard, RRR. No JVD, murmurs, rubs, gallops or clicks. 1+  pedal edema, anasarca, scrotal edema. Gastrointestinal system: Abdomen is nondistended, soft and nontender. No organomegaly or masses felt. Normal bowel sounds heard. Central nervous system: Alert and oriented. No focal neurological deficits. Extremities: Symmetric 5 x 5 power. Skin: No rashes, lesions or ulcers Psychiatry: Judgement and insight appear normal. Mood & affect appropriate.      Data Reviewed: I have personally reviewed following labs and imaging studies  CBC: Recent Labs  Lab 11/30/17 1242 12/01/17 0600 12/02/17 0555 12/06/17 0622  WBC 14.8* 11.6* 9.1 11.6*  NEUTROABS 13.6*  --   --   --   HGB 11.7* 10.7* 11.1* 10.9*  HCT 34.1* 31.6* 31.6* 31.5*  MCV 90.7 92.4 89.5 90.0  PLT 308 263 274 323   Basic Metabolic Panel: Recent Labs  Lab 12/02/17 0555 12/03/17 0447 12/04/17 0513 12/05/17 0540 12/06/17 0622 12/06/17 0713  NA 124* 121* 120* 120*  120* 123*  --   K 3.7 3.9 3.1* 3.6  3.6 3.0*  --   CL 89* 86* 79* 83*  83* 82*  --   CO2 24 24 31 28  27 31   --   GLUCOSE 232* 156* 174* 129*  130* 161*  --   BUN 12 16 19 18  18 16   --   CREATININE 0.57* 0.49* 0.58* 0.59*  0.55* 0.58*  --   CALCIUM 8.4* 8.6* 8.4* 8.0*  7.9* 8.1*  --   MG  --   --   --   --   --  1.7  PHOS  --   --  3.0 2.2*  --   --    GFR: Estimated Creatinine Clearance: 63.7 mL/min (A) (by C-G formula based on SCr of 0.58 mg/dL (L)). Liver Function Tests: Recent Labs  Lab 11/30/17 1242 12/03/17 0447 12/04/17 0513 12/05/17 0540  AST 27 19  --   --   ALT 31 21  --   --   ALKPHOS 86 73  --   --   BILITOT 1.5* 1.1  --   --   PROT 5.9* 5.5*  --   --   ALBUMIN 3.1* 2.8* 2.8* 2.7*   No results for input(s): LIPASE, AMYLASE in the last 168 hours. No results for input(s): AMMONIA in the last 168 hours. Coagulation Profile: Recent Labs  Lab 11/30/17 1242  INR 1.07   Cardiac Enzymes: No results for input(s): CKTOTAL, CKMB, CKMBINDEX, TROPONINI in the last 168 hours. BNP (last 3 results) No results for input(s): PROBNP in the last 8760 hours. HbA1C: No results for input(s): HGBA1C in the last 72 hours. CBG: Recent Labs  Lab 12/05/17 0759 12/05/17 1128 12/05/17 1632 12/05/17 2051 12/06/17 0729  GLUCAP 99 104* 148* 167* 166*   Lipid Profile: No results for input(s): CHOL, HDL, LDLCALC, TRIG, CHOLHDL, LDLDIRECT in the last 72 hours. Thyroid Function  Tests: No results for input(s): TSH, T4TOTAL, FREET4, T3FREE, THYROIDAB in the last 72 hours. Anemia Panel: No results for input(s): VITAMINB12, FOLATE, FERRITIN, TIBC, IRON, RETICCTPCT in the last 72 hours. Sepsis Labs: Recent Labs  Lab 11/30/17 1259  LATICACIDVEN 1.1    Recent Results (from the past 240 hour(s))  Culture, blood (Routine x 2)     Status: None   Collection Time: 11/30/17 12:42 PM  Result Value Ref Range Status   Specimen Description BLOOD PICC LINE  Final   Special Requests   Final    BOTTLES DRAWN AEROBIC AND ANAEROBIC Blood Culture adequate volume   Culture NO GROWTH 5 DAYS  Final   Report Status 12/05/2017 FINAL  Final  Culture, blood (Routine x 2)     Status: None   Collection Time: 11/30/17 12:59 PM  Result Value Ref Range Status   Specimen Description BLOOD LEFT ARM  Final   Special Requests   Final    BOTTLES DRAWN AEROBIC AND ANAEROBIC Blood Culture adequate volume   Culture NO GROWTH 5 DAYS  Final   Report Status 12/05/2017 FINAL  Final  Culture, Urine     Status: None   Collection Time: 11/30/17  9:40 PM  Result Value Ref Range Status   Specimen Description URINE, CLEAN CATCH  Final   Special Requests NONE  Final   Culture   Final    NO GROWTH Performed at [facility], [place] [address], [place], [place] [address]    Report Status 12/02/2017 FINAL  Final         Radiology Studies: Dg Chest Port 1 View  Result Date: 12/05/2017 CLINICAL DATA:  Cough with tachycardia EXAM: PORTABLE CHEST 1 VIEW COMPARISON:  Chest radiograph and chest CT November 30, 2017 FINDINGS: Central catheter tip is in the superior vena cava near the cavoatrial junction. No evident pneumothorax. There is minimal atelectasis in the right base. The lungs elsewhere are clear. Heart size and pulmonary vascular normal. No adenopathy. There is aortic atherosclerosis. There is degenerative change in each shoulder. IMPRESSION: Slight right base atelectasis. No edema or  consolidation. Stable cardiac silhouette. There is aortic atherosclerosis. Central catheter as described without pneumothorax. Aortic Atherosclerosis (ICD10-I70.0). Electronically Signed   By: Lowella Grip III M.D.   On: 12/05/2017 10:29        Scheduled Meds: . carvedilol  6.25 mg Oral BID WC  . enoxaparin (LOVENOX) injection  40 mg Subcutaneous Q24H  . feeding supplement (ENSURE ENLIVE)  237 mL Oral BID BM  . furosemide  20 mg Intravenous BID  . guaiFENesin  1,200 mg Oral BID  . ipratropium  0.5 mg Nebulization Q4H  . levalbuterol  0.63 mg Nebulization Q4H  . levothyroxine  88 mcg Oral QAC breakfast  . methylPREDNISolone (SOLU-MEDROL) injection  60 mg Intravenous Q12H  . mometasone-formoterol  2 puff Inhalation BID  . polyethylene glycol powder  1 Container Oral Once  . pravastatin  20 mg Oral q1800  . sodium chloride  1 g Oral TID WC  . tamsulosin  0.4 mg Oral QPC supper   Continuous Infusions: . potassium chloride 10 mEq (12/06/17 1047)  . vancomycin Stopped (12/06/17 0351)     LOS: 6 days    Time spent: 85mins    Raylee Adamec Darleen Crocker, DO [facility] Pager [phone]  If 7PM-7AM, please contact night-coverage [email or web address] Password [facility] 12/06/2017, 10:48 AM

## 2017-12-06 NOTE — Progress Notes (Signed)
Pharmacy Antibiotic Note  Willie Williamson is a 84 y.o. male admitted on 11/30/2017 with bacteremia and endocarditis  Pharmacy has been consulted for Vancomycin dosing. Pt remains afebrile, WBC  Normal.   Plan: Continue Vancomycin 1000mg  IV every 12 hours.  Goal trough 15-20 mcg/mL. F/u cxs and clinical progress Monitor V/S, labs and levels as indicated weekly   Height: 5\' 7"  (170.2 cm) Weight: 144 lb 6.4 oz (65.5 kg) IBW/kg (Calculated) : 66.1  Temp (24hrs), Avg:98 F (36.7 C), Min:98 F (36.7 C), Max:98 F (36.7 C)  Recent Labs  Lab 11/30/17 1242 11/30/17 1259  12/01/17 0600 12/02/17 0555 12/03/17 0447 12/03/17 1400 12/04/17 0513 12/05/17 0540 12/06/17 0622  WBC 14.8*  --   --  11.6* 9.1  --   --   --   --  11.6*  CREATININE 0.55*  --    < > 0.53* 0.57* 0.49*  --  0.58* 0.59*  0.55* 0.58*  LATICACIDVEN  --  1.1  --   --   --   --   --   --   --   --   VANCOTROUGH  --   --   --   --   --   --  19  --   --   --    < > = values in this interval not displayed.    Estimated Creatinine Clearance: 63.7 mL/min (A) (by C-G formula based on SCr of 0.58 mg/dL (L)).    Allergies  Allergen Reactions  . Penicillins Rash    Has patient had a PCN reaction causing immediate rash, facial/tongue/throat swelling, SOB or lightheadedness with hypotension: Yes Has patient had a PCN reaction causing severe rash involving mucus membranes or skin necrosis: No Has patient had a PCN reaction that required hospitalization: No Has patient had a PCN reaction occurring within the last 10 years: No If all of the above answers are "NO", then may proceed with Cephalosporin use.     Antimicrobials this admission: *12/28 Vancomycin>>OPAT Vancomycin for 6 weeks (Thru 12/30/2017) 1/11 Aztreonam 2gm x 1 dose Dose changes: 1/4 Vancomycin dose change to 1500mg  daily as outpt(previous therapeutic dose 1gm IV q12h) 1/11 per [facility] RN dose changed 2000mg  daily b/c level subtherapeutic(9.7) 1/11 Vancomycin 1gm  IV q12h Microbiology: 1/11 Bcx: ngtd 1/11 UCx: no growth 12/31 Repeat BCX: ngtd 12/30 Repeat BCx: MRSA 12/28 BCx: MRSA  Thank you for allowing pharmacy to be a part of this patient's care.  Isac Sarna, BS [place], [place] Clinical Pharmacist Pager [phone] 12/06/2017 3:04 PM

## 2017-12-06 NOTE — Progress Notes (Signed)
Physical Therapy Treatment Patient Details Name: Willie Williamson MRN: [id number] DOB: 1931/01/16 Today's Date: 12/06/2017    History of Present Illness BRAXTON VANTREASE is a 84 y.o. male with medical history significant for recent 6 day admission and discharge for MRSA bacteremia and endocarditis, 11/16/17- 11/22/17.,  Also history of COPD, Bladder cancer, BPH.      PT Comments    Patient agreeable for therapy and demonstrates increased tolerance for taking steps and stood for up to 3-4 minutes before having to sit, on 3 LPM with O2 sats staying between 97-100% with activity and tolerated sitting up in chair after therapy.  Patient will benefit from continued physical therapy in hospital and recommended venue below to increase strength, balance, endurance for safe ADLs and gait.    Follow Up Recommendations  SNF;Supervision/Assistance - 24 hour     Equipment Recommendations  None recommended by PT    Recommendations for Other Services       Precautions / Restrictions Precautions Precautions: Fall Precaution Comments: fragile weepy skin RUE Restrictions Weight Bearing Restrictions: No    Mobility  Bed Mobility Overal bed mobility: Needs Assistance Bed Mobility: Supine to Sit     Supine to sit: Supervision     General bed mobility comments: labored movement  Transfers Overall transfer level: Needs assistance Equipment used: Rolling walker (2 wheeled) Transfers: Sit to/from [facility] Sit to Stand: Min assist Stand pivot transfers: Min assist       General transfer comment: unsteady on feet  Ambulation/Gait Ambulation/Gait assistance: Mod assist Ambulation Distance (Feet): 8 Feet Assistive device: Rolling walker (2 wheeled) Gait Pattern/deviations: Decreased step length - right;Decreased step length - left;Decreased stride length   Gait velocity interpretation: Below normal speed for age/gender General Gait Details: limited to a few steps at  bedside, marched in place for approx 8-10 steps before having to sit due to c/o fatigue, on 3 LPM, O2 sats at 97-100%   Stairs            Wheelchair Mobility    Modified Rankin (Stroke Patients Only)       Balance Overall balance assessment: Needs assistance Sitting-balance support: No upper extremity supported;Feet supported Sitting balance-Leahy Scale: Good     Standing balance support: Bilateral upper extremity supported;During functional activity Standing balance-Leahy Scale: Fair                              Cognition Arousal/Alertness: Awake/alert Behavior During Therapy: WFL for tasks assessed/performed Overall Cognitive Status: Within Functional Limits for tasks assessed                                        Exercises General Exercises - Lower Extremity Ankle Circles/Pumps: Seated;AROM;Strengthening;Both;10 reps Long Arc Quad: Seated;AROM;Strengthening;Both;10 reps Hip Flexion/Marching: Seated;AROM;Strengthening;Both;10 reps    General Comments        Pertinent Vitals/Pain Pain Assessment: No/denies pain    Home Living                      Prior Function            PT Goals (current goals can now be found in the care plan section) Acute Rehab PT Goals Patient Stated Goal: return home after rehab stay PT Goal Formulation: With patient Time For Goal Achievement: 12/17/17 Potential to Achieve Goals: Good  Progress towards PT goals: Progressing toward goals    Frequency    Min 3X/week      PT Plan Current plan remains appropriate    Co-evaluation              AM-PAC PT "6 Clicks" Daily Activity  Outcome Measure  Difficulty turning over in bed (including adjusting bedclothes, sheets and blankets)?: None Difficulty moving from lying on back to sitting on the side of the bed? : None Difficulty sitting down on and standing up from a chair with arms (e.g., wheelchair, bedside commode, etc,.)?: A  Little Help needed moving to and from a bed to chair (including a wheelchair)?: A Little Help needed walking in hospital room?: A Lot Help needed climbing 3-5 steps with a railing? : Total 6 Click Score: 17    End of Session Equipment Utilized During Treatment: Oxygen Activity Tolerance: Patient tolerated treatment well;Patient limited by fatigue Patient left: in chair;with call bell/phone within reach Nurse Communication: Mobility status PT Visit Diagnosis: Unsteadiness on feet (R26.81);Other abnormalities of gait and mobility (R26.89);Muscle weakness (generalized) (M62.81)     Time: 9373-4287 PT Time Calculation (min) (ACUTE ONLY): 21 min  Charges:  $Therapeutic Activity: 8-22 mins                    G Codes:       3:21 PM, 2017-12-18 Lonell Grandchild, MPT Physical Therapist with [facility] [phone] office [phone] mobile phone

## 2017-12-06 NOTE — Progress Notes (Signed)
Subjective: Interval History: Patient claims he is feeling better.  Still he is feeling weak but improving.  He has some cough with greenish sputum production.  He denies any fever chills or sweating.  Objective: Vital signs in last 24 hours: Temp:  [97.9 F (36.6 C)-98 F (36.7 C)] 98 F (36.7 C) (01/17 0329) Pulse Rate:  [75-95] 95 (01/17 0329) Resp:  [18-20] 18 (01/17 0329) BP: (124-130)/(79-93) 124/93 (01/17 0329) SpO2:  [96 %-100 %] 100 % (01/17 0329) Weight change:   Intake/Output from previous day: 01/16 0701 - 01/17 0700 In: 200 [IV Piggyback:200] Out: 2750 [Urine:2750] Intake/Output this shift: No intake/output data recorded.  General appearance: alert, cooperative and no distress Resp: diminished breath sounds bilaterally Cardio: regular rate and rhythm Extremities: edema Trace to 1+ edema  Lab Results: Recent Labs    12/06/17 0622  WBC 11.6*  HGB 10.9*  HCT 31.5*  PLT 203   BMET:  Recent Labs    12/04/17 0513 12/05/17 0540  NA 120* 120*  120*  K 3.1* 3.6  3.6  CL 79* 83*  83*  CO2 31 28  27   GLUCOSE 174* 129*  130*  BUN 19 18  18   CREATININE 0.58* 0.59*  0.55*  CALCIUM 8.4* 8.0*  7.9*   No results for input(s): PTH in the last 72 hours. Iron Studies: No results for input(s): IRON, TIBC, TRANSFERRIN, FERRITIN in the last 72 hours.  Studies/Results: Dg Chest Port 1 View  Result Date: 12/05/2017 CLINICAL DATA:  Cough with tachycardia EXAM: PORTABLE CHEST 1 VIEW COMPARISON:  Chest radiograph and chest CT November 30, 2017 FINDINGS: Central catheter tip is in the superior vena cava near the cavoatrial junction. No evident pneumothorax. There is minimal atelectasis in the right base. The lungs elsewhere are clear. Heart size and pulmonary vascular normal. No adenopathy. There is aortic atherosclerosis. There is degenerative change in each shoulder. IMPRESSION: Slight right base atelectasis. No edema or consolidation. Stable cardiac silhouette. There  is aortic atherosclerosis. Central catheter as described without pneumothorax. Aortic Atherosclerosis (ICD10-I70.0). Electronically Signed   By: Lowella Grip III M.D.   On: 12/05/2017 10:29    I have reviewed the patient's current medications.  Assessment/Plan: 1] hyponatremia: This is secondary to hypervolemic hyponatremia.  Patient on  free water restriction and sodium supplement his sodium remains low 123 but improving.  2] hypokalemia: Most likely due to diuretics.  His potassium is low 3] CHF: Patient is on Lasix.  He had about 2700 cc of urine output.  His breathing is much better.  He has still some edema but has improved 4] history of COPD: Patient is on a steroid and albuterol inhaler. 5] hypertension: His blood pressure is reasonably controlled 6] coronary artery disease: Patient presently does not have any chest pain 7] history of bladder cancer. Plan: We will continue with free water restriction 2] we will decrease his Lasix to 20 mg iv bid 3] we will increase sodium tablet to 1 gm po tid 4] we will check his renal panel in the morning.     LOS: 6 days   Tama Grosz S 12/06/2017,7:28 AM

## 2017-12-07 LAB — CBC
HCT: 30.7 % — ABNORMAL LOW (ref 39.0–52.0)
HEMOGLOBIN: 10.5 g/dL — AB (ref 13.0–17.0)
MCH: 31.1 pg (ref 26.0–34.0)
MCHC: 34.2 g/dL (ref 30.0–36.0)
MCV: 90.8 fL (ref 78.0–100.0)
Platelets: 197 10*3/uL (ref 150–400)
RBC: 3.38 MIL/uL — ABNORMAL LOW (ref 4.22–5.81)
RDW: 13.2 % (ref 11.5–15.5)
WBC: 12.4 10*3/uL — ABNORMAL HIGH (ref 4.0–10.5)

## 2017-12-07 LAB — BASIC METABOLIC PANEL
ANION GAP: 8 (ref 5–15)
BUN: 18 mg/dL (ref 6–20)
CALCIUM: 7.9 mg/dL — AB (ref 8.9–10.3)
CHLORIDE: 85 mmol/L — AB (ref 101–111)
CO2: 31 mmol/L (ref 22–32)
Creatinine, Ser: 0.58 mg/dL — ABNORMAL LOW (ref 0.61–1.24)
GFR calc Af Amer: >60 mL/min (ref >60)
GFR calc non Af Amer: >60 mL/min (ref >60)
GLUCOSE: 151 mg/dL — AB (ref 65–99)
Potassium: 3.1 mmol/L — ABNORMAL LOW (ref 3.5–5.1)
Sodium: 124 mmol/L — ABNORMAL LOW (ref 135–145)

## 2017-12-07 LAB — MAGNESIUM: MAGNESIUM: 1.6 mg/dL — AB (ref 1.7–2.4)

## 2017-12-07 LAB — GLUCOSE, CAPILLARY
GLUCOSE-CAPILLARY: 219 mg/dL — AB (ref 65–99)
Glucose-Capillary: 138 mg/dL — ABNORMAL HIGH (ref 65–99)
Glucose-Capillary: 187 mg/dL — ABNORMAL HIGH (ref 65–99)
Glucose-Capillary: 163 mg/dL — ABNORMAL HIGH (ref 65–99)

## 2017-12-07 MED ORDER — ENSURE ENLIVE PO LIQD
237.0000 mL | Freq: Two times a day (BID) | ORAL | Status: DC
  Administered 2017-12-08 – 2017-12-11 (×3): 237 mL via ORAL

## 2017-12-07 MED ORDER — POTASSIUM CHLORIDE 10 MEQ/100ML IV SOLN
10.0000 meq | INTRAVENOUS | Status: AC
  Administered 2017-12-07 (×6): 10 meq via INTRAVENOUS
  Filled 2017-12-07 (×2): qty 100

## 2017-12-07 MED ORDER — ADULT MULTIVITAMIN W/MINERALS CH
1.0000 | ORAL_TABLET | Freq: Every day | ORAL | Status: DC
  Administered 2017-12-07 – 2017-12-11 (×5): 1 via ORAL
  Filled 2017-12-07 (×5): qty 1

## 2017-12-07 MED ORDER — PRO-STAT SUGAR FREE PO LIQD
30.0000 mL | Freq: Two times a day (BID) | ORAL | Status: DC
  Administered 2017-12-07 – 2017-12-11 (×8): 30 mL via ORAL
  Filled 2017-12-07 (×8): qty 30

## 2017-12-07 NOTE — Progress Notes (Signed)
PROGRESS NOTE    Willie Williamson  QJJ:[id number] DOB: 09/27/1931 DOA: 11/30/2017 PCP: [name], MD    Brief Narrative:  84 year old male who was recently discharged from the hospital after being treated for MRSA bacteremia/endocarditis, return to the hospital with generalized weakness and poor urine output.  He was found to have significant hyponatremia with a sodium of 120.  Initially started on IV fluids, but now appears hypervolemic.  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  He has had significant hypokalemia due to diuresis that is being aggressively repleted.  He is continued on vancomycin for recent bacteremia.  He continues to have ongoing shortness of breath likely related to his COPD for which she has been placed back on IV steroids.  Plans are for skilled nursing facility placement once he is better.   Assessment & Plan:   Principal Problem:   Hyponatremia Active Problems:   Essential hypertension   COPD with emphysema (HCC)   Chronic respiratory failure with hypoxia (HCC)   MRSA bacteremia   Infective endocarditis   1. Hypervolemic hyponatremia with SIADH slowly improving from 123->124 today.  Appreciate nephrology recommendations.  Continue fluid restriction and lasix. Appreciate Nephrology recommendations. 2. Hypokalemia-persistent. Replete today. BMP and Mg in AM. 3. Recent MRSA bacteremia with endocarditis.  Continue outpatient regimen of vancomycin.  He will complete his vancomycin course on 12/30/17 4. Chronic respiratory failure with steroid dependent COPD-slowly improving.  Chronically on 2 L of oxygen.  More recently, reports needing 3 L of oxygen.  Overall wheezing is improving. Continue IV Solumedrol. Continue nebulizer treatments. Pulmicort BID. Up to chair. Ambulate bid with pulse ox check. IS. 5. Hypertension.  Resume home dose of Coreg 6. Hypothyroidism.  Continue on  Synthroid 7. Possible urinary retention.  Foley catheter placed in the emergency room.  Staff had difficulty placing catheter and patient required coud catheter for placement.  Continue on Flomax.  He has a history of BPH.  Can consider voiding trial prior to discharge in next 1-2 days once he is less symptomatic and able to use urinal.  He currently requires Foley catheter for strict I's and O's. 8. Generalized weakness.  Plans are for skilled nursing facility placement on discharge 9. Weeping of UE bilaterally. Continue dressings.   DVT prophylaxis: Lovenox Code Status: DNR Family Communication: Will discuss with daughter-in-law Disposition Plan: Skilled nursing facility placement on discharge.   Consultants:   Nephrology  Procedures:   None  Antimicrobials:   Vancomycin   Subjective: Feels that he is slowly improving. No acute overnight events. Overall feels "weak".  Objective: Vitals:   12/07/17 0100 12/07/17 0444 12/07/17 0803 12/07/17 0817  BP:  132/76    Pulse:  (!) 107    Resp:  14    Temp:  98 F (36.7 C)    TempSrc:  Oral    SpO2: 98% 98% 94% 100%  Weight:      Height:        Intake/Output Summary (Last 24 hours) at 12/07/2017 0921 Last data filed at 12/07/2017 4818 Gross per 24 hour  Intake 250 ml  Output 2025 ml  Net -1775 ml   Filed Weights   11/30/17 1810  Weight: 65.5 kg (144 lb 6.4 oz)    Examination:  General exam: Appears calm and comfortable  Respiratory system: diminished air entry bilaterally,respiratory effort normal; on 3L [place] Cardiovascular system: S1 & S2 heard, RRR. No JVD,  murmurs, rubs, gallops or clicks. 1+ pedal edema, anasarca, scrotal edema. Gastrointestinal system: Abdomen is nondistended, soft and nontender. No organomegaly or masses felt. Normal bowel sounds heard. Central nervous system: Alert and oriented. No focal neurological deficits. Extremities: Symmetric 5 x 5 power. Skin: No rashes, lesions or ulcers Psychiatry:  Judgement and insight appear normal. Mood & affect appropriate.     Data Reviewed: I have personally reviewed following labs and imaging studies  CBC: Recent Labs  Lab 11/30/17 1242 12/01/17 0600 12/02/17 0555 12/06/17 0622 12/07/17 0502  WBC 14.8* 11.6* 9.1 11.6* 12.4*  NEUTROABS 13.6*  --   --   --   --   HGB 11.7* 10.7* 11.1* 10.9* 10.5*  HCT 34.1* 31.6* 31.6* 31.5* 30.7*  MCV 90.7 92.4 89.5 90.0 90.8  PLT 308 263 274 203 073   Basic Metabolic Panel: Recent Labs  Lab 12/03/17 0447 12/04/17 0513 12/05/17 0540 12/06/17 0622 12/06/17 0713 12/07/17 0502  NA 121* 120* 120*  120* 123*  --  124*  K 3.9 3.1* 3.6  3.6 3.0*  --  3.1*  CL 86* 79* 83*  83* 82*  --  85*  CO2 24 31 28  27 31   --  31  GLUCOSE 156* 174* 129*  130* 161*  --  151*  BUN 16 19 18  18 16   --  18  CREATININE 0.49* 0.58* 0.59*  0.55* 0.58*  --  0.58*  CALCIUM 8.6* 8.4* 8.0*  7.9* 8.1*  --  7.9*  MG  --   --   --   --  1.7 1.6*  PHOS  --  3.0 2.2*  --   --   --    GFR: Estimated Creatinine Clearance: 63.7 mL/min (A) (by C-G formula based on SCr of 0.58 mg/dL (L)). Liver Function Tests: Recent Labs  Lab 11/30/17 1242 12/03/17 0447 12/04/17 0513 12/05/17 0540  AST 27 19  --   --   ALT 31 21  --   --   ALKPHOS 86 73  --   --   BILITOT 1.5* 1.1  --   --   PROT 5.9* 5.5*  --   --   ALBUMIN 3.1* 2.8* 2.8* 2.7*   No results for input(s): LIPASE, AMYLASE in the last 168 hours. No results for input(s): AMMONIA in the last 168 hours. Coagulation Profile: Recent Labs  Lab 11/30/17 1242  INR 1.07   Cardiac Enzymes: No results for input(s): CKTOTAL, CKMB, CKMBINDEX, TROPONINI in the last 168 hours. BNP (last 3 results) No results for input(s): PROBNP in the last 8760 hours. HbA1C: No results for input(s): HGBA1C in the last 72 hours. CBG: Recent Labs  Lab 12/06/17 0729 12/06/17 1136 12/06/17 1544 12/06/17 2144 12/07/17 0807  GLUCAP 166* 164* 204* 152* 138*   Lipid Profile: No  results for input(s): CHOL, HDL, LDLCALC, TRIG, CHOLHDL, LDLDIRECT in the last 72 hours. Thyroid Function Tests: No results for input(s): TSH, T4TOTAL, FREET4, T3FREE, THYROIDAB in the last 72 hours. Anemia Panel: No results for input(s): VITAMINB12, FOLATE, FERRITIN, TIBC, IRON, RETICCTPCT in the last 72 hours. Sepsis Labs: Recent Labs  Lab 11/30/17 1259  LATICACIDVEN 1.1    Recent Results (from the past 240 hour(s))  Culture, blood (Routine x 2)     Status: None   Collection Time: 11/30/17 12:42 PM  Result Value Ref Range Status   Specimen Description BLOOD PICC LINE  Final   Special Requests   Final    BOTTLES DRAWN  AEROBIC AND ANAEROBIC Blood Culture adequate volume   Culture NO GROWTH 5 DAYS  Final   Report Status 12/05/2017 FINAL  Final  Culture, blood (Routine x 2)     Status: None   Collection Time: 11/30/17 12:59 PM  Result Value Ref Range Status   Specimen Description BLOOD LEFT ARM  Final   Special Requests   Final    BOTTLES DRAWN AEROBIC AND ANAEROBIC Blood Culture adequate volume   Culture NO GROWTH 5 DAYS  Final   Report Status 12/05/2017 FINAL  Final  Culture, Urine     Status: None   Collection Time: 11/30/17  9:40 PM  Result Value Ref Range Status   Specimen Description URINE, CLEAN CATCH  Final   Special Requests NONE  Final   Culture   Final    NO GROWTH Performed at [facility], [place] [address] [place], [place] [address]    Report Status 12/02/2017 FINAL  Final         Radiology Studies: Dg Chest Port 1 View  Result Date: 12/05/2017 CLINICAL DATA:  Cough with tachycardia EXAM: PORTABLE CHEST 1 VIEW COMPARISON:  Chest radiograph and chest CT November 30, 2017 FINDINGS: Central catheter tip is in the superior vena cava near the cavoatrial junction. No evident pneumothorax. There is minimal atelectasis in the right base. The lungs elsewhere are clear. Heart size and pulmonary vascular normal. No adenopathy. There is aortic atherosclerosis. There  is degenerative change in each shoulder. IMPRESSION: Slight right base atelectasis. No edema or consolidation. Stable cardiac silhouette. There is aortic atherosclerosis. Central catheter as described without pneumothorax. Aortic Atherosclerosis (ICD10-I70.0). Electronically Signed   By: Lowella Grip III M.D.   On: 12/05/2017 10:29        Scheduled Meds: . carvedilol  6.25 mg Oral BID WC  . enoxaparin (LOVENOX) injection  40 mg Subcutaneous Q24H  . feeding supplement (ENSURE ENLIVE)  237 mL Oral BID BM  . furosemide  20 mg Intravenous BID  . guaiFENesin  1,200 mg Oral BID  . ipratropium  0.5 mg Nebulization Q4H  . levalbuterol  0.63 mg Nebulization Q4H  . levothyroxine  88 mcg Oral QAC breakfast  . methylPREDNISolone (SOLU-MEDROL) injection  60 mg Intravenous Q12H  . mometasone-formoterol  2 puff Inhalation BID  . pravastatin  20 mg Oral q1800  . sodium chloride  1 g Oral TID WC  . tamsulosin  0.4 mg Oral QPC supper   Continuous Infusions: . potassium chloride 10 mEq (12/07/17 0835)  . vancomycin Stopped (12/07/17 0307)     LOS: 7 days    Time spent: 63mins    Dominic Rhome Darleen Crocker, DO [facility] Pager [phone]  If 7PM-7AM, please contact night-coverage [email or web address] Password [facility] 12/07/2017, 9:21 AM

## 2017-12-07 NOTE — Progress Notes (Signed)
Nutrition Follow-up  DOCUMENTATION CODES:   Not applicable  INTERVENTION:  D/c Ensure Enlive BID due to fluid restriction  Add Prostat 30 ml BID   Downgrade diet Pureed to decrease work of chewing   NUTRITION DIAGNOSIS:   Inadequate oral intake related to poor appetite, acute illness(Recent MRSA infection/IV abx likely playing role) as evidenced by per patient/family report.   GOAL:   Patient will meet greater than or equal to 90% of their needs   MONITOR:   PO intake, Supplement acceptance, Labs, Weight trends, I & O's  REASON FOR ASSESSMENT:   Malnutrition Screening Tool    ASSESSMENT:  The patient is discharging to SNF when he is medically cleared. He still is not eating well only 0-50% of meals. He has an Ensure which he has drank about 50% of - he doesn't especially like them. He says it has been difficult for him to eat due to food just won't go down. He eats a few bites and then unable to consume much volume each eating occasion. We talked about downgrading his diet to make it easier for him to consume. He is also willing to try protein modular instead of the Ensure as a way to increase his nutrition intake. No current weight available to assess percent change.     NUTRITION - FOCUSED PHYSICAL EXAM: Severe dorsal depletion, mild temporal depletion, moderate subcutaneous fat loss thoracic and loss to upper arms   Diet Order:  Diet Heart Room service appropriate? Yes; Fluid consistency: Thin; Fluid restriction: 1200 mL Fluid  EDUCATION NEEDS:   No education needs have been identified at this time  Skin:  Skin Assessment: Reviewed RN Assessment  Last BM:  1/10   Height:   Ht Readings from Last 1 Encounters:  11/30/17 5\' 7"  (1.702 m)    Weight:   Wt Readings from Last 1 Encounters:  11/30/17 144 lb 6.4 oz (65.5 kg)    Ideal Body Weight:  67.27 kg  BMI:  Body mass index is 22.62 kg/m.  Estimated Nutritional Needs:   Kcal:  1850-2050 (28-31  kcal/kg bw)  Protein:  80-92 (1.2-1.4 g/kg bw)  Fluid:  1200 ml per MD  Colman Cater MS,RD,CSG,LDN Office: [phone] Pager: [phone]

## 2017-12-07 NOTE — Care Management Important Message (Signed)
Important Message  Patient Details  Name: Willie Williamson MRN: [id number] Date of Birth: 05/27/1931   Medicare Important Message Given:  Yes    Sherald Barge, RN 12/07/2017, 1:20 PM

## 2017-12-07 NOTE — Progress Notes (Signed)
Late entry Pt's arms continue to weep, changed dressings twice on night shift.

## 2017-12-07 NOTE — Progress Notes (Signed)
Willie Williamson  MRN: [id number]  DOB/AGE: 1931-04-21 84 y.o.  Primary Care Physician:Burdine, Virgina Evener, MD  Admit date: 11/30/2017  Chief Complaint:  Chief Complaint  Patient presents with  . Tachycardia    S-Pt presented on  11/30/2017 with  Chief Complaint  Patient presents with  . Tachycardia  .    Pt offers no  New complaints .Pt son was present in the room ,pt son asked relevant questions about his soudium and I tried to answer the questions to best of my ability.   Meds . carvedilol  6.25 mg Oral BID WC  . enoxaparin (LOVENOX) injection  40 mg Subcutaneous Q24H  . feeding supplement (ENSURE ENLIVE)  237 mL Oral BID BM  . furosemide  20 mg Intravenous BID  . guaiFENesin  1,200 mg Oral BID  . ipratropium  0.5 mg Nebulization Q4H  . levalbuterol  0.63 mg Nebulization Q4H  . levothyroxine  88 mcg Oral QAC breakfast  . methylPREDNISolone (SOLU-MEDROL) injection  60 mg Intravenous Q12H  . mometasone-formoterol  2 puff Inhalation BID  . pravastatin  20 mg Oral q1800  . sodium chloride  1 g Oral TID WC  . tamsulosin  0.4 mg Oral QPC supper       Physical Exam: Vital signs in last 24 hours: Temp:  [98 F (36.7 C)-98.1 F (36.7 C)] 98 F (36.7 C) (01/18 0444) Pulse Rate:  [107-111] 107 (01/18 0444) Resp:  [14-20] 14 (01/18 0444) BP: (132-134)/(76-87) 132/76 (01/18 0444) SpO2:  [93 %-100 %] 98 % (01/18 1531) Weight change:  Last BM Date: 11/29/17  Intake/Output from previous day: 01/17 0701 - 01/18 0700 In: 250 [P.O.:250] Out: 2025 [Urine:2025] No intake/output data recorded.   Physical Exam: General- pt is awake,alert, oriented to time place and person Resp- No acute REsp distress,  Rhonchi+ CVS- S1S2 regular in rate and rhythm GIT- BS+, soft, NT, ND EXT- 1+ LE Edema, Cyanosis   Lab Results: HGb 11.1  BMET Recent Labs    12/06/17 0622 12/07/17 0502  NA 123* 124*  K 3.0* 3.1*  CL 82* 85*  CO2 31 31  GLUCOSE 161* 151*  BUN 16 18  CREATININE  0.58* 0.58*  CALCIUM 8.1* 7.9*   Sodium 2019 120=> 126=> 120=> 123=>124  MICRO Recent Results (from the past 240 hour(s))  Culture, blood (Routine x 2)     Status: None   Collection Time: 11/30/17 12:42 PM  Result Value Ref Range Status   Specimen Description BLOOD PICC LINE  Final   Special Requests   Final    BOTTLES DRAWN AEROBIC AND ANAEROBIC Blood Culture adequate volume   Culture NO GROWTH 5 DAYS  Final   Report Status 12/05/2017 FINAL  Final  Culture, blood (Routine x 2)     Status: None   Collection Time: 11/30/17 12:59 PM  Result Value Ref Range Status   Specimen Description BLOOD LEFT ARM  Final   Special Requests   Final    BOTTLES DRAWN AEROBIC AND ANAEROBIC Blood Culture adequate volume   Culture NO GROWTH 5 DAYS  Final   Report Status 12/05/2017 FINAL  Final  Culture, Urine     Status: None   Collection Time: 11/30/17  9:40 PM  Result Value Ref Range Status   Specimen Description URINE, CLEAN CATCH  Final   Special Requests NONE  Final   Culture   Final    NO GROWTH Performed at [facility], [address] [place],  [place] [address]    Report Status 12/02/2017 FINAL  Final      Lab Results  Component Value Date   CALCIUM 7.9 (L) 12/07/2017   PHOS 2.2 (L) 12/05/2017         Impression: 1)Hyponatremia        Hypovolemic at the time of presentataion        Now pt had hypervolemia vs SIADH        On Diuretics and free water restriction   2)HTN  Medication-  On Diuretics On Alpha and beta Blockers   3)Anemia HGb at goal (9--11)  4)CHF PT is on diurtics  5)COPD Primary MD following  6)Hypokalemic Sec to diuretics   7)Acid base Co2 at goal    Plan:  Will continue current tx    Mkenzie Dotts S 12/07/2017, 3:53 PM

## 2017-12-08 DIAGNOSIS — D649 Anemia, unspecified: Secondary | ICD-10-CM | POA: Diagnosis not present

## 2017-12-08 DIAGNOSIS — E876 Hypokalemia: Secondary | ICD-10-CM | POA: Diagnosis not present

## 2017-12-08 DIAGNOSIS — E871 Hypo-osmolality and hyponatremia: Secondary | ICD-10-CM | POA: Diagnosis not present

## 2017-12-08 LAB — GLUCOSE, CAPILLARY
GLUCOSE-CAPILLARY: 204 mg/dL — AB (ref 65–99)
Glucose-Capillary: 171 mg/dL — ABNORMAL HIGH (ref 65–99)
Glucose-Capillary: 177 mg/dL — ABNORMAL HIGH (ref 65–99)
Glucose-Capillary: 193 mg/dL — ABNORMAL HIGH (ref 65–99)

## 2017-12-08 LAB — BASIC METABOLIC PANEL
Anion gap: 9 (ref 5–15)
BUN: 21 mg/dL — AB (ref 6–20)
CHLORIDE: 84 mmol/L — AB (ref 101–111)
CO2: 32 mmol/L (ref 22–32)
CREATININE: 0.52 mg/dL — AB (ref 0.61–1.24)
Calcium: 8.1 mg/dL — ABNORMAL LOW (ref 8.9–10.3)
GFR calc Af Amer: >60 mL/min (ref >60)
GFR calc non Af Amer: >60 mL/min (ref >60)
GLUCOSE: 163 mg/dL — AB (ref 65–99)
Potassium: 3.5 mmol/L (ref 3.5–5.1)
SODIUM: 125 mmol/L — AB (ref 135–145)

## 2017-12-08 LAB — MAGNESIUM: MAGNESIUM: 1.8 mg/dL (ref 1.7–2.4)

## 2017-12-08 MED ORDER — METHYLPREDNISOLONE SODIUM SUCC 40 MG IJ SOLR
40.0000 mg | Freq: Two times a day (BID) | INTRAMUSCULAR | Status: DC
  Administered 2017-12-08 – 2017-12-11 (×7): 40 mg via INTRAVENOUS
  Filled 2017-12-08 (×7): qty 1

## 2017-12-08 MED ORDER — POTASSIUM CHLORIDE CRYS ER 20 MEQ PO TBCR
20.0000 meq | EXTENDED_RELEASE_TABLET | Freq: Two times a day (BID) | ORAL | Status: DC
  Administered 2017-12-08 – 2017-12-10 (×5): 20 meq via ORAL
  Filled 2017-12-08 (×5): qty 1

## 2017-12-08 MED ORDER — MAGNESIUM CITRATE PO SOLN
1.0000 | Freq: Once | ORAL | Status: AC
  Administered 2017-12-08: 1 via ORAL
  Filled 2017-12-08: qty 296

## 2017-12-08 NOTE — Progress Notes (Signed)
PROGRESS NOTE    Willie Williamson  ZOX:[id number] DOB: Apr 18, 1931 DOA: 11/30/2017 PCP: [name], MD    Brief Narrative:  84 year old male who was recently discharged from the hospital after being treated for MRSA bacteremia/endocarditis, return to the hospital with generalized weakness and poor urine output.  He was found to have significant hyponatremia with a sodium of 120.  Initially started on IV fluids, but now appears hypervolemic.  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  He has had significant hypokalemia due to diuresis that was repleted.  He is continued on vancomycin for recent bacteremia.  He continues to have ongoing shortness of breath likely related to his COPD for which he was placed on IV steroids, but this appears to be improving.  Plans are for skilled nursing facility placement once he is better.  Ambulate with assist twice daily.   Assessment & Plan:   Principal Problem:   Hyponatremia Active Problems:   Essential hypertension   COPD with emphysema (HCC)   Chronic respiratory failure with hypoxia (HCC)   MRSA bacteremia   Infective endocarditis   1. Hypervolemic hyponatremia with SIADH slowly improving from 124->125 today.  Appreciate nephrology recommendations.  Continue fluid restriction and lasix. Appreciate Nephrology recommendations. 2. Hypokalemia-improved. Started 10meq kcl bid today. 3. Recent MRSA bacteremia with endocarditis.  Continue outpatient regimen of vancomycin.  He will complete his vancomycin course on 12/30/17 4. Chronic respiratory failure with steroid dependent COPD-slowly improving.  Chronically on 2 L of oxygen.  More recently, reports needing 3 L of oxygen.  Overall wheezing is improving. Continue IV Solumedrol now to 40mg  BID. Continue nebulizer treatments. Pulmicort BID. Up to chair. Ambulate bid with pulse ox check. IS. 5. Constipation. Mg Citrate x 1 today. If no BM  will try enema. 6. Hypertension.  Resume home dose of Coreg 7. Hypothyroidism.  Continue on Synthroid 8. Possible urinary retention.  Foley catheter placed in the emergency room.  Staff had difficulty placing catheter and patient required coud catheter for placement.  Continue on Flomax.  He has a history of BPH. DC Foley today. 9. Generalized weakness.  Plans are for skilled nursing facility placement on discharge 10. Weeping of UE bilaterally. Continue dressings. This is likely due to hyponatremia.   DVT prophylaxis: Lovenox Code Status: DNR Family Communication: Will discuss with daughter-in-law Disposition Plan: Skilled nursing facility placement on discharge.   Consultants:   Nephrology  Procedures:   None  Antimicrobials:   Vancomycin   Subjective: Feels that he is slowly improving. No acute overnight events. Overall continues to feel "weak".  He has not had a bowel movement for which magnesium citrate has been administered.  Objective: Vitals:   12/07/17 2327 12/08/17 0412 12/08/17 0624 12/08/17 0810  BP:   99/70 (!) 125/95  Pulse:   (!) 101 (!) 115  Resp:   20   Temp:   97.8 F (36.6 C)   TempSrc:   Oral   SpO2: 96% 98% 98%   Weight:      Height:        Intake/Output Summary (Last 24 hours) at 12/08/2017 0913 Last data filed at 12/08/2017 8119 Gross per 24 hour  Intake 560 ml  Output 1900 ml  Net -1340 ml   Filed Weights   11/30/17 1810  Weight: 65.5 kg (144 lb 6.4 oz)    Examination:  General exam: Appears calm and comfortable; foley with clear,  yellow UO.  Respiratory system: diminished air entry bilaterally,respiratory effort normal; on 3L [place] Cardiovascular system: S1 & S2 heard, RRR. No JVD, murmurs, rubs, gallops or clicks. 1+ pedal edema, anasarca, scrotal edema. Gastrointestinal system: Abdomen is nondistended, soft and nontender. No organomegaly or masses felt. Normal bowel sounds heard. Central nervous system: Alert and oriented. No focal  neurological deficits. Extremities: Symmetric 5 x 5 power. Skin: No rashes, lesions or ulcers Psychiatry: Judgement and insight appear normal. Mood & affect appropriate.     Data Reviewed: I have personally reviewed following labs and imaging studies  CBC: Recent Labs  Lab 12/02/17 0555 12/06/17 0622 12/07/17 0502  WBC 9.1 11.6* 12.4*  HGB 11.1* 10.9* 10.5*  HCT 31.6* 31.5* 30.7*  MCV 89.5 90.0 90.8  PLT 274 203 062   Basic Metabolic Panel: Recent Labs  Lab 12/04/17 0513 12/05/17 0540 12/06/17 0622 12/06/17 0713 12/07/17 0502 12/08/17 0413  NA 120* 120*  120* 123*  --  124* 125*  K 3.1* 3.6  3.6 3.0*  --  3.1* 3.5  CL 79* 83*  83* 82*  --  85* 84*  CO2 31 28  27 31   --  31 32  GLUCOSE 174* 129*  130* 161*  --  151* 163*  BUN 19 18  18 16   --  18 21*  CREATININE 0.58* 0.59*  0.55* 0.58*  --  0.58* 0.52*  CALCIUM 8.4* 8.0*  7.9* 8.1*  --  7.9* 8.1*  MG  --   --   --  1.7 1.6* 1.8  PHOS 3.0 2.2*  --   --   --   --    GFR: Estimated Creatinine Clearance: 63.7 mL/min (A) (by C-G formula based on SCr of 0.52 mg/dL (L)). Liver Function Tests: Recent Labs  Lab 12/03/17 0447 12/04/17 0513 12/05/17 0540  AST 19  --   --   ALT 21  --   --   ALKPHOS 73  --   --   BILITOT 1.1  --   --   PROT 5.5*  --   --   ALBUMIN 2.8* 2.8* 2.7*   No results for input(s): LIPASE, AMYLASE in the last 168 hours. No results for input(s): AMMONIA in the last 168 hours. Coagulation Profile: No results for input(s): INR, PROTIME in the last 168 hours. Cardiac Enzymes: No results for input(s): CKTOTAL, CKMB, CKMBINDEX, TROPONINI in the last 168 hours. BNP (last 3 results) No results for input(s): PROBNP in the last 8760 hours. HbA1C: No results for input(s): HGBA1C in the last 72 hours. CBG: Recent Labs  Lab 12/07/17 0807 12/07/17 1152 12/07/17 1634 12/07/17 2103 12/08/17 0723  GLUCAP 138* 187* 219* 163* 171*   Lipid Profile: No results for input(s): CHOL, HDL,  LDLCALC, TRIG, CHOLHDL, LDLDIRECT in the last 72 hours. Thyroid Function Tests: No results for input(s): TSH, T4TOTAL, FREET4, T3FREE, THYROIDAB in the last 72 hours. Anemia Panel: No results for input(s): VITAMINB12, FOLATE, FERRITIN, TIBC, IRON, RETICCTPCT in the last 72 hours. Sepsis Labs: No results for input(s): PROCALCITON, LATICACIDVEN in the last 168 hours.  Recent Results (from the past 240 hour(s))  Culture, blood (Routine x 2)     Status: None   Collection Time: 11/30/17 12:42 PM  Result Value Ref Range Status   Specimen Description BLOOD PICC LINE  Final   Special Requests   Final    BOTTLES DRAWN AEROBIC AND ANAEROBIC Blood Culture adequate volume   Culture NO GROWTH 5 DAYS  Final  Report Status 12/05/2017 FINAL  Final  Culture, blood (Routine x 2)     Status: None   Collection Time: 11/30/17 12:59 PM  Result Value Ref Range Status   Specimen Description BLOOD LEFT ARM  Final   Special Requests   Final    BOTTLES DRAWN AEROBIC AND ANAEROBIC Blood Culture adequate volume   Culture NO GROWTH 5 DAYS  Final   Report Status 12/05/2017 FINAL  Final  Culture, Urine     Status: None   Collection Time: 11/30/17  9:40 PM  Result Value Ref Range Status   Specimen Description URINE, CLEAN CATCH  Final   Special Requests NONE  Final   Culture   Final    NO GROWTH Performed at [facility], [place] [address], [place], [place] [address]    Report Status 12/02/2017 FINAL  Final         Radiology Studies: No results found.      Scheduled Meds: . carvedilol  6.25 mg Oral BID WC  . enoxaparin (LOVENOX) injection  40 mg Subcutaneous Q24H  . feeding supplement (ENSURE ENLIVE)  237 mL Oral BID BM  . feeding supplement (PRO-STAT SUGAR FREE 64)  30 mL Oral BID  . furosemide  20 mg Intravenous BID  . guaiFENesin  1,200 mg Oral BID  . ipratropium  0.5 mg Nebulization Q4H  . levalbuterol  0.63 mg Nebulization Q4H  . levothyroxine  88 mcg Oral QAC breakfast  .  methylPREDNISolone (SOLU-MEDROL) injection  40 mg Intravenous Q12H  . mometasone-formoterol  2 puff Inhalation BID  . multivitamin with minerals  1 tablet Oral Daily  . potassium chloride  20 mEq Oral BID  . pravastatin  20 mg Oral q1800  . sodium chloride  1 g Oral TID WC  . tamsulosin  0.4 mg Oral QPC supper   Continuous Infusions: . vancomycin Stopped (12/08/17 0422)     LOS: 8 days    Time spent: 63mins    Adream Parzych Darleen Crocker, DO [facility] Pager [phone]  If 7PM-7AM, please contact night-coverage [email or web address] Password [facility] 12/08/2017, 9:13 AM

## 2017-12-08 NOTE — Progress Notes (Signed)
Patient noted with no BM since 11/29/17.  Pt states he has chronic constipation and usually take magnesium citrate to stimulate BM.  Dr Olevia Bowens notified with MD Verbal order for Magnesium Citrate 1 bottle, verbal order entered.  Will administer when patient awakens.

## 2017-12-08 NOTE — Progress Notes (Signed)
Willie Williamson  MRN: [id number]  DOB/AGE: Apr 17, 1931 84 y.o.  Primary Care Physician:Burdine, Virgina Evener, MD  Admit date: 11/30/2017  Chief Complaint:  Chief Complaint  Patient presents with  . Tachycardia    S-Pt presented on  11/30/2017 with  Chief Complaint  Patient presents with  . Tachycardia  .    Pt offers no  new complaints the patient says " I just feel weak"   Meds . carvedilol  6.25 mg Oral BID WC  . enoxaparin (LOVENOX) injection  40 mg Subcutaneous Q24H  . feeding supplement (ENSURE ENLIVE)  237 mL Oral BID BM  . feeding supplement (PRO-STAT SUGAR FREE 64)  30 mL Oral BID  . furosemide  20 mg Intravenous BID  . guaiFENesin  1,200 mg Oral BID  . ipratropium  0.5 mg Nebulization Q4H  . levalbuterol  0.63 mg Nebulization Q4H  . levothyroxine  88 mcg Oral QAC breakfast  . methylPREDNISolone (SOLU-MEDROL) injection  60 mg Intravenous Q12H  . mometasone-formoterol  2 puff Inhalation BID  . multivitamin with minerals  1 tablet Oral Daily  . pravastatin  20 mg Oral q1800  . sodium chloride  1 g Oral TID WC  . tamsulosin  0.4 mg Oral QPC supper       Physical Exam: Vital signs in last 24 hours: Temp:  [97.8 F (36.6 C)-98.3 F (36.8 C)] 97.8 F (36.6 C) (01/19 0624) Pulse Rate:  [81-115] 115 (01/19 0810) Resp:  [18-20] 20 (01/19 0624) BP: (99-129)/(70-95) 125/95 (01/19 0810) SpO2:  [96 %-99 %] 98 % (01/19 0624) Weight change:  Last BM Date: 11/29/16  Intake/Output from previous day: 01/18 0701 - 01/19 0700 In: 560 [P.O.:360; IV Piggyback:200] Out: 1900 [Urine:1900] No intake/output data recorded.   Physical Exam: General- pt is awake,alert, oriented to time place and person Resp- No acute REsp distress,  Rhonchi+ CVS- S1S2 regular in rate and rhythm GIT- BS+, soft, NT, ND EXT- 1+ LE Edema, Cyanosis   Lab Results: HGb 11.1  BMET Recent Labs    12/07/17 0502 12/08/17 0413  NA 124* 125*  K 3.1* 3.5  CL 85* 84*  CO2 31 32  GLUCOSE 151*  163*  BUN 18 21*  CREATININE 0.58* 0.52*  CALCIUM 7.9* 8.1*   Sodium 2019 120=> 126=> 120=> 123=>124=>125  MICRO Recent Results (from the past 240 hour(s))  Culture, blood (Routine x 2)     Status: None   Collection Time: 11/30/17 12:42 PM  Result Value Ref Range Status   Specimen Description BLOOD PICC LINE  Final   Special Requests   Final    BOTTLES DRAWN AEROBIC AND ANAEROBIC Blood Culture adequate volume   Culture NO GROWTH 5 DAYS  Final   Report Status 12/05/2017 FINAL  Final  Culture, blood (Routine x 2)     Status: None   Collection Time: 11/30/17 12:59 PM  Result Value Ref Range Status   Specimen Description BLOOD LEFT ARM  Final   Special Requests   Final    BOTTLES DRAWN AEROBIC AND ANAEROBIC Blood Culture adequate volume   Culture NO GROWTH 5 DAYS  Final   Report Status 12/05/2017 FINAL  Final  Culture, Urine     Status: None   Collection Time: 11/30/17  9:40 PM  Result Value Ref Range Status   Specimen Description URINE, CLEAN CATCH  Final   Special Requests NONE  Final   Culture   Final    NO GROWTH Performed at [facility]  Lab, [address], [place], [place] [address]    Report Status 12/02/2017 FINAL  Final      Lab Results  Component Value Date   CALCIUM 8.1 (L) 12/08/2017   PHOS 2.2 (L) 12/05/2017         Impression: 1)Hyponatremia        Hypovolemic at the time of presentataion        Now pt had hypervolemia vs SIADH        On Diuretics and free water restriction   2)HTN  Medication-  On Diuretics On Alpha and beta Blockers   3)Anemia HGb at goal (9--11)  4)CHF PT is on diuretics  5)COPD Primary MD following  6)Hypokalemic Sec to diuretics Now beter  7)Acid base Co2 at goal    Plan:  Will continue current tx    Ethridge Sollenberger S 12/08/2017, 8:34 AM

## 2017-12-09 DIAGNOSIS — E871 Hypo-osmolality and hyponatremia: Secondary | ICD-10-CM | POA: Diagnosis not present

## 2017-12-09 DIAGNOSIS — D649 Anemia, unspecified: Secondary | ICD-10-CM | POA: Diagnosis not present

## 2017-12-09 DIAGNOSIS — E876 Hypokalemia: Secondary | ICD-10-CM | POA: Diagnosis not present

## 2017-12-09 LAB — BASIC METABOLIC PANEL
Anion gap: 9 (ref 5–15)
BUN: 33 mg/dL — ABNORMAL HIGH (ref 6–20)
CHLORIDE: 86 mmol/L — AB (ref 101–111)
CO2: 33 mmol/L — AB (ref 22–32)
CREATININE: 0.59 mg/dL — AB (ref 0.61–1.24)
Calcium: 8.2 mg/dL — ABNORMAL LOW (ref 8.9–10.3)
GFR calc Af Amer: >60 mL/min (ref >60)
GFR calc non Af Amer: >60 mL/min (ref >60)
GLUCOSE: 167 mg/dL — AB (ref 65–99)
Potassium: 4.1 mmol/L (ref 3.5–5.1)
Sodium: 128 mmol/L — ABNORMAL LOW (ref 135–145)

## 2017-12-09 LAB — GLUCOSE, CAPILLARY
GLUCOSE-CAPILLARY: 150 mg/dL — AB (ref 65–99)
Glucose-Capillary: 161 mg/dL — ABNORMAL HIGH (ref 65–99)
Glucose-Capillary: 210 mg/dL — ABNORMAL HIGH (ref 65–99)
Glucose-Capillary: 200 mg/dL — ABNORMAL HIGH (ref 65–99)

## 2017-12-09 MED ORDER — IPRATROPIUM BROMIDE 0.02 % IN SOLN
0.5000 mg | Freq: Three times a day (TID) | RESPIRATORY_TRACT | Status: DC
  Administered 2017-12-09 – 2017-12-11 (×6): 0.5 mg via RESPIRATORY_TRACT
  Filled 2017-12-09 (×6): qty 2.5

## 2017-12-09 MED ORDER — FLEET ENEMA 7-19 GM/118ML RE ENEM
1.0000 | ENEMA | Freq: Once | RECTAL | Status: AC
  Administered 2017-12-09: 1 via RECTAL

## 2017-12-09 MED ORDER — LEVALBUTEROL HCL 0.63 MG/3ML IN NEBU
0.6300 mg | INHALATION_SOLUTION | Freq: Three times a day (TID) | RESPIRATORY_TRACT | Status: DC
  Administered 2017-12-09 – 2017-12-11 (×6): 0.63 mg via RESPIRATORY_TRACT
  Filled 2017-12-09 (×6): qty 3

## 2017-12-09 MED ORDER — NYSTATIN 100000 UNIT/ML MT SUSP
5.0000 mL | Freq: Four times a day (QID) | OROMUCOSAL | Status: DC
  Administered 2017-12-09 – 2017-12-11 (×6): 500000 [IU] via ORAL
  Filled 2017-12-09 (×4): qty 5

## 2017-12-09 NOTE — Progress Notes (Signed)
Pharmacy Antibiotic Note  Willie Williamson is a 84 y.o. male admitted on 11/30/2017 with bacteremia and endocarditis  Pharmacy has been consulted for Vancomycin dosing. Pt remains afebrile..   Plan: Continue Vancomycin 1000mg  IV every 12 hours.  Goal trough 15-20 mcg/mL. Trough ordered for 1/21 1330 F/u cxs and clinical progress Monitor V/S, labs and levels as indicated weekly   Height: 5\' 7"  (170.2 cm) Weight: 144 lb 6.4 oz (65.5 kg) IBW/kg (Calculated) : 66.1  Temp (24hrs), Avg:97.8 F (36.6 C), Min:97.5 F (36.4 C), Max:98.2 F (36.8 C)  Recent Labs  Lab 12/03/17 1400  12/05/17 0540 12/06/17 0622 12/07/17 0502 12/08/17 0413 12/09/17 0638  WBC  --   --   --  11.6* 12.4*  --   --   CREATININE  --    < > 0.59*  0.55* 0.58* 0.58* 0.52* 0.59*  VANCOTROUGH 19  --   --   --   --   --   --    < > = values in this interval not displayed.    Estimated Creatinine Clearance: 63.7 mL/min (A) (by C-G formula based on SCr of 0.59 mg/dL (L)).    Allergies  Allergen Reactions  . Penicillins Rash    Has patient had a PCN reaction causing immediate rash, facial/tongue/throat swelling, SOB or lightheadedness with hypotension: Yes Has patient had a PCN reaction causing severe rash involving mucus membranes or skin necrosis: No Has patient had a PCN reaction that required hospitalization: No Has patient had a PCN reaction occurring within the last 10 years: No If all of the above answers are "NO", then may proceed with Cephalosporin use.     Antimicrobials this admission: *12/28 Vancomycin>>OPAT Vancomycin for 6 weeks (Thru 12/30/2017) 1/11 Aztreonam 2gm x 1 dose Dose changes: 1/4 Vancomycin dose change to 1500mg  daily as outpt(previous therapeutic dose 1gm IV q12h) 1/11 per [facility] RN dose changed 2000mg  daily b/c level subtherapeutic(9.7) 1/11 Vancomycin 1gm IV q12h Microbiology: 1/11 Bcx: ngtd 1/11 UCx: no growth 12/31 Repeat BCX: ngtd 12/30 Repeat BCx: MRSA 12/28 BCx: MRSA  Thank  you for allowing pharmacy to be a part of this patient's care.  Margot Ables, PharmD Clinical Pharmacist 12/09/2017 8:35 AM

## 2017-12-09 NOTE — Progress Notes (Signed)
PROGRESS NOTE    Willie Williamson  BTD:[id number] DOB: 11-03-1931 DOA: 11/30/2017 PCP: [name], MD    Brief Narrative:  84 year old male who was recently discharged from the hospital after being treated for MRSA bacteremia/endocarditis, return to the hospital with generalized weakness and poor urine output.  He was found to have significant hyponatremia with a sodium of 120 which has now improved to 128.  Initially started on IV fluids, but now appears hypervolemic.  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  He has had significant hypokalemia due to diuresis that was repleted.  He is continued on vancomycin for recent bacteremia with pharmacy to dose.  He continues to have ongoing shortness of breath likely related to his COPD for which he was placed on IV steroids, but this appears to be very slowly improving.  Plans are for skilled nursing facility placement once he is better.  Foley catheter has been removed and patient is urinating well.  He has not had a bowel movement in 1 week for which he will receive an enema today.   Assessment & Plan:   Principal Problem:   Hyponatremia Active Problems:   Essential hypertension   COPD with emphysema (HCC)   Chronic respiratory failure with hypoxia (HCC)   MRSA bacteremia   Infective endocarditis   1. Hypervolemic hyponatremia with SIADH slowly improving from 125->128 today.  Appreciate further nephrology recommendations.  Continue fluid restriction, lasix, and salt tablets. Hypokalemia-improved. Started 21meq kcl bid yesterday. 2. Recent MRSA bacteremia with endocarditis.  Continue outpatient regimen of vancomycin.  He will complete his vancomycin course on 12/30/17 3. Chronic respiratory failure with steroid dependent COPD-slowly improving.  Chronically on 2 L of oxygen.  More recently, reports needing 3 L of oxygen.  Overall wheezing is improving. Continue IV Solumedrol  40mg  BID. Continue nebulizer treatments. Pulmicort BID. Up to chair. Ambulate bid with pulse ox check. IS. 4. Constipation. Enema today. 5. Hypertension.  Resume home dose of Coreg 6. Hypothyroidism.  Continue on Synthroid 7. Possible urinary retention. Foley removed yesterday and this has resolved. 8. Generalized weakness.  Plans are for skilled nursing facility placement on discharge 9. Weeping of UE bilaterally-improving. Continue dressings. This is likely due to hyponatremia and hypoalbuminemia. He is eating his meals.   DVT prophylaxis: Lovenox Code Status: DNR Family Communication: Will discuss with daughter-in-law; please keep family updated Disposition Plan: Skilled nursing facility placement on discharge.   Consultants:   Nephrology  Procedures:   None  Antimicrobials:   Vancomycin   Subjective: Feels that he is slowly improving. No acute overnight events. Overall continues to feel "weak".  He has not had a bowel movement even after magnesium citrate yesterday.  Additionally, Foley catheter has been removed with good urine output noted.  Objective: Vitals:   12/09/17 0425 12/09/17 0510 12/09/17 0756 12/09/17 0806  BP:  (!) 142/61    Pulse:  99    Resp:  18    Temp:  (!) 97.5 F (36.4 C)    TempSrc:  Oral    SpO2: 96% 95% 94% 94%  Weight:      Height:        Intake/Output Summary (Last 24 hours) at 12/09/2017 1026 Last data filed at 12/09/2017 0800 Gross per 24 hour  Intake 1120 ml  Output 900 ml  Net 220 ml   Filed Weights   11/30/17 1810  Weight: 65.5 kg (144 lb  6.4 oz)    Examination:  General exam: Appears calm and comfortable Respiratory system: diminished air entry bilaterally,respiratory effort normal; on 3L Impact Cardiovascular system: S1 & S2 heard, RRR. No JVD, murmurs, rubs, gallops or clicks. 1+ pedal edema, anasarca, scrotal edema. Gastrointestinal system: Abdomen is nondistended, soft and nontender. No organomegaly or masses felt. Normal  bowel sounds heard. Central nervous system: Alert and oriented. No focal neurological deficits. Extremities: Symmetric 5 x 5 power. Skin: No rashes, lesions or ulcers Psychiatry: Judgement and insight appear normal. Mood & affect appropriate.     Data Reviewed: I have personally reviewed following labs and imaging studies  CBC: Recent Labs  Lab 12/06/17 0622 12/07/17 0502  WBC 11.6* 12.4*  HGB 10.9* 10.5*  HCT 31.5* 30.7*  MCV 90.0 90.8  PLT 203 196   Basic Metabolic Panel: Recent Labs  Lab 12/04/17 0513 12/05/17 0540 12/06/17 0622 12/06/17 0713 12/07/17 0502 12/08/17 0413 12/09/17 0638  NA 120* 120*  120* 123*  --  124* 125* 128*  K 3.1* 3.6  3.6 3.0*  --  3.1* 3.5 4.1  CL 79* 83*  83* 82*  --  85* 84* 86*  CO2 31 28  27 31   --  31 32 33*  GLUCOSE 174* 129*  130* 161*  --  151* 163* 167*  BUN 19 18  18 16   --  18 21* 33*  CREATININE 0.58* 0.59*  0.55* 0.58*  --  0.58* 0.52* 0.59*  CALCIUM 8.4* 8.0*  7.9* 8.1*  --  7.9* 8.1* 8.2*  MG  --   --   --  1.7 1.6* 1.8  --   PHOS 3.0 2.2*  --   --   --   --   --    GFR: Estimated Creatinine Clearance: 63.7 mL/min (A) (by C-G formula based on SCr of 0.59 mg/dL (L)). Liver Function Tests: Recent Labs  Lab 12/03/17 0447 12/04/17 0513 12/05/17 0540  AST 19  --   --   ALT 21  --   --   ALKPHOS 73  --   --   BILITOT 1.1  --   --   PROT 5.5*  --   --   ALBUMIN 2.8* 2.8* 2.7*   No results for input(s): LIPASE, AMYLASE in the last 168 hours. No results for input(s): AMMONIA in the last 168 hours. Coagulation Profile: No results for input(s): INR, PROTIME in the last 168 hours. Cardiac Enzymes: No results for input(s): CKTOTAL, CKMB, CKMBINDEX, TROPONINI in the last 168 hours. BNP (last 3 results) No results for input(s): PROBNP in the last 8760 hours. HbA1C: No results for input(s): HGBA1C in the last 72 hours. CBG: Recent Labs  Lab 12/08/17 0723 12/08/17 1128 12/08/17 1618 12/08/17 2057 12/09/17 0716    GLUCAP 171* 177* 193* 204* 161*   Lipid Profile: No results for input(s): CHOL, HDL, LDLCALC, TRIG, CHOLHDL, LDLDIRECT in the last 72 hours. Thyroid Function Tests: No results for input(s): TSH, T4TOTAL, FREET4, T3FREE, THYROIDAB in the last 72 hours. Anemia Panel: No results for input(s): VITAMINB12, FOLATE, FERRITIN, TIBC, IRON, RETICCTPCT in the last 72 hours. Sepsis Labs: No results for input(s): PROCALCITON, LATICACIDVEN in the last 168 hours.  Recent Results (from the past 240 hour(s))  Culture, blood (Routine x 2)     Status: None   Collection Time: 11/30/17 12:42 PM  Result Value Ref Range Status   Specimen Description BLOOD PICC LINE  Final   Special Requests   Final  BOTTLES DRAWN AEROBIC AND ANAEROBIC Blood Culture adequate volume   Culture NO GROWTH 5 DAYS  Final   Report Status 12/05/2017 FINAL  Final  Culture, blood (Routine x 2)     Status: None   Collection Time: 11/30/17 12:59 PM  Result Value Ref Range Status   Specimen Description BLOOD LEFT ARM  Final   Special Requests   Final    BOTTLES DRAWN AEROBIC AND ANAEROBIC Blood Culture adequate volume   Culture NO GROWTH 5 DAYS  Final   Report Status 12/05/2017 FINAL  Final  Culture, Urine     Status: None   Collection Time: 11/30/17  9:40 PM  Result Value Ref Range Status   Specimen Description URINE, CLEAN CATCH  Final   Special Requests NONE  Final   Culture   Final    NO GROWTH Performed at [facility], [place] [address], [place], [place] [address]    Report Status 12/02/2017 FINAL  Final         Radiology Studies: No results found.      Scheduled Meds: . carvedilol  6.25 mg Oral BID WC  . enoxaparin (LOVENOX) injection  40 mg Subcutaneous Q24H  . feeding supplement (ENSURE ENLIVE)  237 mL Oral BID BM  . feeding supplement (PRO-STAT SUGAR FREE 64)  30 mL Oral BID  . furosemide  20 mg Intravenous BID  . guaiFENesin  1,200 mg Oral BID  . ipratropium  0.5 mg Nebulization TID  .  levalbuterol  0.63 mg Nebulization TID  . levothyroxine  88 mcg Oral QAC breakfast  . methylPREDNISolone (SOLU-MEDROL) injection  40 mg Intravenous Q12H  . mometasone-formoterol  2 puff Inhalation BID  . multivitamin with minerals  1 tablet Oral Daily  . potassium chloride  20 mEq Oral BID  . pravastatin  20 mg Oral q1800  . sodium chloride  1 g Oral TID WC  . tamsulosin  0.4 mg Oral QPC supper   Continuous Infusions: . vancomycin Stopped (12/09/17 0357)     LOS: 9 days    Time spent: 24mins    Perrion Diesel D Manuella Ghazi, DO [facility] Pager [phone]  If 7PM-7AM, please contact night-coverage [email or web address] Password [email or web address] 12/09/2017, 10:26 AM

## 2017-12-09 NOTE — Progress Notes (Signed)
Willie Williamson  MRN: [id number]  DOB/AGE: 82-07-1931 82 y.o.  Primary Care Physician:Burdine, Virgina Evener, MD  Admit date: 11/30/2017  Chief Complaint:  Chief Complaint  Patient presents with  . Tachycardia    S-Pt presented on  11/30/2017 with  Chief Complaint  Patient presents with  . Tachycardia  .    Pt says " I just feel weak"   Meds . carvedilol  6.25 mg Oral BID WC  . enoxaparin (LOVENOX) injection  40 mg Subcutaneous Q24H  . feeding supplement (ENSURE ENLIVE)  237 mL Oral BID BM  . feeding supplement (PRO-STAT SUGAR FREE 64)  30 mL Oral BID  . furosemide  20 mg Intravenous BID  . guaiFENesin  1,200 mg Oral BID  . ipratropium  0.5 mg Nebulization TID  . levalbuterol  0.63 mg Nebulization TID  . levothyroxine  88 mcg Oral QAC breakfast  . methylPREDNISolone (SOLU-MEDROL) injection  40 mg Intravenous Q12H  . mometasone-formoterol  2 puff Inhalation BID  . multivitamin with minerals  1 tablet Oral Daily  . potassium chloride  20 mEq Oral BID  . pravastatin  20 mg Oral q1800  . sodium chloride  1 g Oral TID WC  . tamsulosin  0.4 mg Oral QPC supper       Physical Exam: Vital signs in last 24 hours: Temp:  [97.5 F (36.4 C)-98.2 F (36.8 C)] 97.5 F (36.4 C) (01/20 0510) Pulse Rate:  [57-99] 99 (01/20 0510) Resp:  [16-18] 18 (01/20 0510) BP: (96-142)/(61-106) 142/61 (01/20 0510) SpO2:  [94 %-99 %] 94 % (01/20 0806) Weight change:  Last BM Date: 11/29/16  Intake/Output from previous day: 01/19 0701 - 01/20 0700 In: 1120 [P.O.:720; IV Piggyback:400] Out: 900 [Urine:900] No intake/output data recorded.   Physical Exam: General- pt is awake,alert, oriented to time place and person Resp- No acute REsp distress,  Rhonchi+ CVS- S1S2 regular in rate and rhythm GIT- BS+, soft, NT, ND EXT- 1+ LE Edema, Cyanosis   Lab Results: HGb 11.1  BMET Recent Labs    12/08/17 0413 12/09/17 0638  NA 125* 128*  K 3.5 4.1  CL 84* 86*  CO2 32 33*  GLUCOSE 163*  167*  BUN 21* 33*  CREATININE 0.52* 0.59*  CALCIUM 8.1* 8.2*   Sodium 2019 120=> 126=> 120=> 123=>124=>125=>128 MICRO Recent Results (from the past 240 hour(s))  Culture, blood (Routine x 2)     Status: None   Collection Time: 11/30/17 12:42 PM  Result Value Ref Range Status   Specimen Description BLOOD PICC LINE  Final   Special Requests   Final    BOTTLES DRAWN AEROBIC AND ANAEROBIC Blood Culture adequate volume   Culture NO GROWTH 5 DAYS  Final   Report Status 12/05/2017 FINAL  Final  Culture, blood (Routine x 2)     Status: None   Collection Time: 11/30/17 12:59 PM  Result Value Ref Range Status   Specimen Description BLOOD LEFT ARM  Final   Special Requests   Final    BOTTLES DRAWN AEROBIC AND ANAEROBIC Blood Culture adequate volume   Culture NO GROWTH 5 DAYS  Final   Report Status 12/05/2017 FINAL  Final  Culture, Urine     Status: None   Collection Time: 11/30/17  9:40 PM  Result Value Ref Range Status   Specimen Description URINE, CLEAN CATCH  Final   Special Requests NONE  Final   Culture   Final    NO GROWTH Performed at [facility], [place] [address], [place], [place] [address]    Report Status 12/02/2017 FINAL  Final      Lab Results  Component Value Date   CALCIUM 8.2 (L) 12/09/2017   PHOS 2.2 (L) 12/05/2017         Impression: 1)Hyponatremia        Hypovolemic at the time of presentataion        Now pt had hypervolemia vs SIADH        On Diuretics and free water restriction   2)HTN  Medication-  On Diuretics On Alpha and beta Blockers   3)Anemia HGb at goal (9--11)  4)CHF PT is on diuretics  5)COPD Primary MD following  6)Hypokalemic Sec to diuretics Now beter  7)Acid base Co2 at goal    Plan:  Will continue current tx    Iram Astorino S 12/09/2017, 9:16 AM

## 2017-12-10 DIAGNOSIS — J9611 Chronic respiratory failure with hypoxia: Secondary | ICD-10-CM | POA: Diagnosis not present

## 2017-12-10 DIAGNOSIS — E871 Hypo-osmolality and hyponatremia: Secondary | ICD-10-CM | POA: Diagnosis not present

## 2017-12-10 DIAGNOSIS — I10 Essential (primary) hypertension: Secondary | ICD-10-CM | POA: Diagnosis not present

## 2017-12-10 DIAGNOSIS — E876 Hypokalemia: Secondary | ICD-10-CM | POA: Diagnosis not present

## 2017-12-10 DIAGNOSIS — J441 Chronic obstructive pulmonary disease with (acute) exacerbation: Secondary | ICD-10-CM | POA: Diagnosis not present

## 2017-12-10 DIAGNOSIS — R7881 Bacteremia: Secondary | ICD-10-CM | POA: Diagnosis not present

## 2017-12-10 LAB — CBC
HCT: 31.8 % — ABNORMAL LOW (ref 39.0–52.0)
HEMOGLOBIN: 10.4 g/dL — AB (ref 13.0–17.0)
MCH: 30.8 pg (ref 26.0–34.0)
MCHC: 32.7 g/dL (ref 30.0–36.0)
MCV: 94.1 fL (ref 78.0–100.0)
Platelets: 204 10*3/uL (ref 150–400)
RBC: 3.38 MIL/uL — AB (ref 4.22–5.81)
RDW: 13.1 % (ref 11.5–15.5)
WBC: 11.7 10*3/uL — ABNORMAL HIGH (ref 4.0–10.5)

## 2017-12-10 LAB — BASIC METABOLIC PANEL
Anion gap: 9 (ref 5–15)
BUN: 38 mg/dL — ABNORMAL HIGH (ref 6–20)
CHLORIDE: 88 mmol/L — AB (ref 101–111)
CO2: 32 mmol/L (ref 22–32)
CREATININE: 0.62 mg/dL (ref 0.61–1.24)
Calcium: 7.9 mg/dL — ABNORMAL LOW (ref 8.9–10.3)
GFR calc Af Amer: >60 mL/min (ref >60)
GFR calc non Af Amer: >60 mL/min (ref >60)
GLUCOSE: 157 mg/dL — AB (ref 65–99)
POTASSIUM: 4 mmol/L (ref 3.5–5.1)
SODIUM: 129 mmol/L — AB (ref 135–145)

## 2017-12-10 LAB — VANCOMYCIN, TROUGH: VANCOMYCIN TR: 29 ug/mL — AB (ref 15–20)

## 2017-12-10 LAB — GLUCOSE, CAPILLARY
GLUCOSE-CAPILLARY: 156 mg/dL — AB (ref 65–99)
GLUCOSE-CAPILLARY: 158 mg/dL — AB (ref 65–99)
Glucose-Capillary: 161 mg/dL — ABNORMAL HIGH (ref 65–99)
Glucose-Capillary: 152 mg/dL — ABNORMAL HIGH (ref 65–99)

## 2017-12-10 MED ORDER — VANCOMYCIN HCL 10 G IV SOLR
1500.0000 mg | INTRAVENOUS | Status: DC
  Administered 2017-12-11: 1500 mg via INTRAVENOUS
  Filled 2017-12-10 (×2): qty 1500

## 2017-12-10 NOTE — Progress Notes (Signed)
Physical Therapy Treatment Patient Details Name: Willie Williamson MRN: [id number] DOB: 07-31-1931 Today's Date: 12/10/2017    History of Present Illness Willie Williamson is a 84 y.o. male with medical history significant for recent 6 day admission and discharge for MRSA bacteremia and endocarditis, 11/16/17- 11/22/17.,  Also history of COPD, Bladder cancer, BPH.      PT Comments    Patient demonstrates slow labored movement for sitting up and taking steps to transfer to chair, unable able to ambulate beyond bedside due to severe SOB and BLE weakness.  Patient tolerated sitting up in chair after therapy - RN notified.  Patient will benefit from continued physical therapy in hospital and recommended venue below to increase strength, balance, endurance for safe ADLs and gait.    Follow Up Recommendations  SNF;Supervision/Assistance - 24 hour     Equipment Recommendations  None recommended by PT    Recommendations for Other Services       Precautions / Restrictions Precautions Precautions: Fall Precaution Comments: fragile weepy skin RUE Restrictions Weight Bearing Restrictions: No    Mobility  Bed Mobility Overal bed mobility: Needs Assistance Bed Mobility: Supine to Sit     Supine to sit: Supervision     General bed mobility comments: with head of bed raised  Transfers Overall transfer level: Needs assistance Equipment used: Rolling walker (2 wheeled) Transfers: Sit to/from [facility] Sit to Stand: Min assist Stand pivot transfers: Min assist;Mod assist       General transfer comment: labored slow movement  Ambulation/Gait Ambulation/Gait assistance: Mod assist Ambulation Distance (Feet): 3 Feet Assistive device: Rolling walker (2 wheeled) Gait Pattern/deviations: Decreased step length - right;Decreased step length - left;Decreased stride length   Gait velocity interpretation: Below normal speed for age/gender General Gait Details: limited to 3-4  side steps demonstrating slow labored movement with near loss of balance, limited mostly due SOB/BLE weakness   Stairs            Wheelchair Mobility    Modified Rankin (Stroke Patients Only)       Balance Overall balance assessment: Needs assistance Sitting-balance support: Bilateral upper extremity supported;Feet supported       Standing balance support: Bilateral upper extremity supported;During functional activity Standing balance-Leahy Scale: Poor Standing balance comment: fair/poor with RW                            Cognition Arousal/Alertness: Awake/alert Behavior During Therapy: WFL for tasks assessed/performed Overall Cognitive Status: Within Functional Limits for tasks assessed                                        Exercises General Exercises - Lower Extremity Ankle Circles/Pumps: Seated;AROM;Strengthening;Both;10 reps Long Arc Quad: Seated;AROM;Strengthening;Both;5 reps Hip Flexion/Marching: Seated;AROM;Strengthening;Both;5 reps    General Comments        Pertinent Vitals/Pain Pain Assessment: No/denies pain    Home Living                      Prior Function            PT Goals (current goals can now be found in the care plan section) Acute Rehab PT Goals Patient Stated Goal: return home after rehab stay PT Goal Formulation: With patient Time For Goal Achievement: 12/17/17 Potential to Achieve Goals: Good Progress towards PT goals: Progressing  toward goals    Frequency    Min 3X/week      PT Plan Current plan remains appropriate    Co-evaluation              AM-PAC PT "6 Clicks" Daily Activity  Outcome Measure  Difficulty turning over in bed (including adjusting bedclothes, sheets and blankets)?: None Difficulty moving from lying on back to sitting on the side of the bed? : None Difficulty sitting down on and standing up from a chair with arms (e.g., wheelchair, bedside commode,  etc,.)?: A Little Help needed moving to and from a bed to chair (including a wheelchair)?: A Lot Help needed walking in hospital room?: A Lot Help needed climbing 3-5 steps with a railing? : Total 6 Click Score: 16    End of Session Equipment Utilized During Treatment: Oxygen Activity Tolerance: Patient limited by fatigue Patient left: in chair;with call bell/phone within reach Nurse Communication: Mobility status PT Visit Diagnosis: Unsteadiness on feet (R26.81);Other abnormalities of gait and mobility (R26.89);Muscle weakness (generalized) (M62.81)     Time: 2993-7169 PT Time Calculation (min) (ACUTE ONLY): 23 min  Charges:  $Therapeutic Activity: 23-37 mins                    G Codes:       12:17 PM, 01/08/18 Lonell Grandchild, MPT Physical Therapist with [facility] [phone] office [phone] mobile phone

## 2017-12-10 NOTE — Progress Notes (Signed)
PROGRESS NOTE                                                                                                                                                                                                             Patient Demographics:    Willie Williamson, is a 84 y.o. male, DOB - 04-04-1931, RXV:[id number]  Admit date - 11/30/2017   Admitting Physician Ejiroghene Arlyce Dice, MD  Outpatient Primary MD for the patient is Burdine, Virgina Evener, MD  LOS - 10  Outpatient Specialists: none  Chief Complaint  Patient presents with  . Tachycardia       Brief Narrative  84 year old male with COPD with chronic respiratory failure on home O2 (2-3 L), CAD, hypertension, BPH with history of bladder cancer with recent hospitalization for MRSA bacteremia and discharged on 6 weeks of IV vancomycin until 2/10 return to the ED with generalized weakness and poor urine output with findings of severe hyponatremia (sodium of 120) initially improved with fluids but developed volume overload followed by severe hypokalemia due to diuresis. Hospital course prolonged with slow improvement in his sodium, fluid overload requiring diuresis and COPD exacerbation requiring steroids.    Subjective:   Still feels very weak and short of breath.   Assessment  & Plan :   Principal problem Hyponatremia Combination of hypervolaemia and SIADH. Sodium slowly improving. 129 today. Continue low-dose IV Lasix and salt tablets. Negative balance of 2.1 L. Renal consult following. Continue strict I/O and daily weight.  Acute diastolic CHF Monitor with IV Lasix. Recent 2-D echo with EF of 55-60%  Hypokalemia Replenished.  COPD exacerbation with chronic respiratory failure Continue IV Solu-Medrol 40 mg twice a day. Still has bilateral wheezing. Continue scheduled DuoNeb's and Pulmicort twice a day. O2 sat stable on 2-3 L.   Hypothyroidism Continue  Synthroid.  Urinary retention No issues after Foley removed 2 days back. Continue Flomax.   Generalized weakness with severe deconditioning Palliative care consulted for goals of care discussion. Needs SNF upon discharge, possibly in the next 1-2 days.  Essential hypertension Continue Coreg.  Constipation Improved after enema given yesterday.  MRSA bacteremia Continue IV vancomycin. Last dose on 2/10.   Code Status :  DO NOT RESUSCITATE  Family Communication  :  None at bedside  Disposition Plan  :  SNF possibly tomorrow if improved  Barriers For Discharge :  Active symptoms  Consults  :  Nephrology  Procedures  :  2-D echo  DVT Prophylaxis  :  Lovenox Lab Results  Component Value Date   PLT 204 12/10/2017    Antibiotics  :    Anti-infectives (From admission, onward)   Start     Dose/Rate Route Frequency Ordered Stop   12/01/17 0200  vancomycin (VANCOCIN) IVPB 1000 mg/200 mL premix     1,000 mg 200 mL/hr over 60 Minutes Intravenous Every 12 hours 11/30/17 2124     11/30/17 1600  aztreonam (AZACTAM) 2 g in dextrose 5 % 50 mL IVPB  Status:  Discontinued     2 g 100 mL/hr over 30 Minutes Intravenous  Once 11/30/17 1559 11/30/17 1642   11/30/17 1430  vancomycin (VANCOCIN) 2,000 mg in sodium chloride 0.9 % 500 mL IVPB     2,000 mg 250 mL/hr over 120 Minutes Intravenous  Once 11/30/17 1403 11/30/17 1623   11/30/17 1345  vancomycin (VANCOCIN) IVPB 1000 mg/200 mL premix  Status:  Discontinued     1,000 mg 200 mL/hr over 60 Minutes Intravenous  Once 11/30/17 1335 11/30/17 1345        Objective:   Vitals:   12/09/17 2053 12/10/17 0453 12/10/17 0823 12/10/17 0831  BP:  129/75    Pulse:  93    Resp:  18    Temp:  98.2 F (36.8 C)    TempSrc:  Oral    SpO2: 95% 96% 95% 95%  Weight:      Height:        Wt Readings from Last 3 Encounters:  11/30/17 65.5 kg (144 lb 6.4 oz)  11/22/17 64.2 kg (141 lb 8.6 oz)  08/08/17 69.1 kg (152 lb 6 oz)     Intake/Output  Summary (Last 24 hours) at 12/10/2017 1026 Last data filed at 12/10/2017 0600 Gross per 24 hour  Intake 640 ml  Output 1400 ml  Net -760 ml     Physical Exam  Gen:  Elderly male, appears fatigued, not in distress HEENT: no pallor, moist mucosa, supple neck Chest:  Scattered wheezing bilaterally, no crackles CVS: N S1&S2, no murmurs, rubs or gallop GI: soft, NT, ND, BS+ Musculoskeletal: warm, no edema     Data Review:    CBC Recent Labs  Lab 12/06/17 0622 12/07/17 0502 12/10/17 0623  WBC 11.6* 12.4* 11.7*  HGB 10.9* 10.5* 10.4*  HCT 31.5* 30.7* 31.8*  PLT 203 197 204  MCV 90.0 90.8 94.1  MCH 31.1 31.1 30.8  MCHC 34.6 34.2 32.7  RDW 13.1 13.2 13.1    Chemistries  Recent Labs  Lab 12/06/17 0622 12/06/17 0713 12/07/17 0502 12/08/17 0413 12/09/17 0638 12/10/17 0623  NA 123*  --  124* 125* 128* 129*  K 3.0*  --  3.1* 3.5 4.1 4.0  CL 82*  --  85* 84* 86* 88*  CO2 31  --  31 32 33* 32  GLUCOSE 161*  --  151* 163* 167* 157*  BUN 16  --  18 21* 33* 38*  CREATININE 0.58*  --  0.58* 0.52* 0.59* 0.62  CALCIUM 8.1*  --  7.9* 8.1* 8.2* 7.9*  MG  --  1.7 1.6* 1.8  --   --    ------------------------------------------------------------------------------------------------------------------ No results for input(s): CHOL, HDL, LDLCALC, TRIG, CHOLHDL, LDLDIRECT in the last 72 hours.  No results found for: HGBA1C ------------------------------------------------------------------------------------------------------------------ No results for input(s): TSH, T4TOTAL, T3FREE, THYROIDAB in the last 72  hours.  Invalid input(s): FREET3 ------------------------------------------------------------------------------------------------------------------ No results for input(s): VITAMINB12, FOLATE, FERRITIN, TIBC, IRON, RETICCTPCT in the last 72 hours.  Coagulation profile No results for input(s): INR, PROTIME in the last 168 hours.  No results for input(s): DDIMER in the last 72  hours.  Cardiac Enzymes No results for input(s): CKMB, TROPONINI, MYOGLOBIN in the last 168 hours.  Invalid input(s): CK ------------------------------------------------------------------------------------------------------------------    Component Value Date/Time   BNP 54.0 11/16/2017 1058    Inpatient Medications  Scheduled Meds: . carvedilol  6.25 mg Oral BID WC  . enoxaparin (LOVENOX) injection  40 mg Subcutaneous Q24H  . feeding supplement (ENSURE ENLIVE)  237 mL Oral BID BM  . feeding supplement (PRO-STAT SUGAR FREE 64)  30 mL Oral BID  . furosemide  20 mg Intravenous BID  . guaiFENesin  1,200 mg Oral BID  . ipratropium  0.5 mg Nebulization TID  . levalbuterol  0.63 mg Nebulization TID  . levothyroxine  88 mcg Oral QAC breakfast  . methylPREDNISolone (SOLU-MEDROL) injection  40 mg Intravenous Q12H  . mometasone-formoterol  2 puff Inhalation BID  . multivitamin with minerals  1 tablet Oral Daily  . nystatin  5 mL Oral QID  . potassium chloride  20 mEq Oral BID  . pravastatin  20 mg Oral q1800  . sodium chloride  1 g Oral TID WC  . tamsulosin  0.4 mg Oral QPC supper   Continuous Infusions: . vancomycin 1,000 mg (12/10/17 0100)   PRN Meds:.acetaminophen **OR** acetaminophen, alum & mag hydroxide-simeth, HYDROcodone-acetaminophen, ipratropium, levalbuterol, promethazine  Micro Results Recent Results (from the past 240 hour(s))  Culture, blood (Routine x 2)     Status: None   Collection Time: 11/30/17 12:42 PM  Result Value Ref Range Status   Specimen Description BLOOD PICC LINE  Final   Special Requests   Final    BOTTLES DRAWN AEROBIC AND ANAEROBIC Blood Culture adequate volume   Culture NO GROWTH 5 DAYS  Final   Report Status 12/05/2017 FINAL  Final  Culture, blood (Routine x 2)     Status: None   Collection Time: 11/30/17 12:59 PM  Result Value Ref Range Status   Specimen Description BLOOD LEFT ARM  Final   Special Requests   Final    BOTTLES DRAWN AEROBIC  AND ANAEROBIC Blood Culture adequate volume   Culture NO GROWTH 5 DAYS  Final   Report Status 12/05/2017 FINAL  Final  Culture, Urine     Status: None   Collection Time: 11/30/17  9:40 PM  Result Value Ref Range Status   Specimen Description URINE, CLEAN CATCH  Final   Special Requests NONE  Final   Culture   Final    NO GROWTH Performed at [facility], [place] [address], [place], [place] [address]    Report Status 12/02/2017 FINAL  Final    Radiology Reports Dg Chest 2 View  Result Date: 11/16/2017 CLINICAL DATA:  Tachycardia, chest pain, shortness of breath EXAM: CHEST  2 VIEW COMPARISON:  04/20/2017 FINDINGS: Lungs are essentially clear. Bibasilar scarring. No focal consolidation. No pleural effusion or pneumothorax. The heart is normal in size. Moderate compression fracture deformity of a mid and lower vertebral body, possibly T5 and T11. Mild anterior wedging of an additional mid thoracic vertebral body, possibly T4. These findings are age indeterminate but new from the prior. IMPRESSION: No evidence of acute cardiopulmonary disease. Mild to moderate compression fracture deformities of three thoracic vertebral bodies, as described above. These  findings are age indeterminate but new from 04/20/2017. Electronically Signed   By: Julian Hy M.D.   On: 11/16/2017 11:40   Ct Angio Chest Pe W And/or Wo Contrast  Result Date: 11/30/2017 CLINICAL DATA:  Tachypnea, history coronary artery disease, COPD, hypertension, bladder cancer EXAM: CT ANGIOGRAPHY CHEST WITH CONTRAST TECHNIQUE: Multidetector CT imaging of the chest was performed using the standard protocol during bolus administration of intravenous contrast. Multiplanar CT image reconstructions and MIPs were obtained to evaluate the vascular anatomy. CONTRAST:  181mL ISOVUE-370 IOPAMIDOL (ISOVUE-370) INJECTION 76% IV COMPARISON:  11/16/2017 FINDINGS: Cardiovascular: Atherosclerotic calcifications aorta, proximal great vessels and  coronary arteries. Aorta normal caliber without aneurysm or dissection. Small pericardial effusion. Pulmonary arteries well opacified and patent. No evidence of pulmonary embolism. Narrowing at origin of celiac artery. Mediastinum/Nodes: Esophagus unremarkable. No thoracic adenopathy. Base of cervical region normal appearance. Lungs/Pleura: Emphysematous changes with mild central peribronchial thickening. Subsegmental atelectasis RIGHT lower lobe and minimally in lingula. No acute infiltrate, pleural effusion, or pneumothorax. Upper Abdomen: Small nonobstructing calculus lower pole RIGHT kidney. Remaining visualized upper abdomen unremarkable Musculoskeletal: Bones demineralized. Compression fractures of multiple vertebral bodies including T5, T6, T12 and superior endplate L2. Review of the MIP images confirms the above findings. IMPRESSION: No evidence of pulmonary embolism. Small pericardial effusion. COPD changes with subsegmental atelectasis in RIGHT lower lobe and minimally in lingula. Multiple thoracolumbar compression fractures. Aortic Atherosclerosis (ICD10-I70.0) and Emphysema (ICD10-J43.9). Electronically Signed   By: Lavonia Dana M.D.   On: 11/30/2017 15:07   Mr Lumbar Spine Wo Contrast  Result Date: 11/19/2017 CLINICAL DATA:  Chronic low back pain. EXAM: MRI LUMBAR SPINE WITHOUT CONTRAST TECHNIQUE: Multiplanar, multisequence MR imaging of the lumbar spine was performed. No intravenous contrast was administered. COMPARISON:  CT scan abdomen and pelvis dated 08/04/2010 and CT scans dated 11/16/2017 and chest x-rays dated 04/20/2017 and 11/16/2017 FINDINGS: Segmentation:  Standard. Alignment:  Physiologic. Vertebrae: There are acute or subacute benign appearing compression fractures involving of T11 and T12. The fracture of the superior endplate of Y77 is subtle. The fracture of T12 is more prominent. Conus medullaris and cauda equina: Conus extends to the L1 level. Conus and cauda equina appear normal.  Paraspinal and other soft tissues: Negative. Disc levels: T10-11: Minimal acute or subacute benign-appearing compression fracture of the superior endplate of A12. No disc bulging or protrusion. No protrusion of bone into the spinal canal. T11-12: Moderately severe benign-appearing compression fracture of T12. No protrusion of the T11-12 disc into the spinal canal. Slight protrusion of the posterior margin of T12 into the spinal canal without neural impingement. T12-L1: No bulging or protrusion of the disc into the spinal canal. The compression fracture of T12 involves the superior and inferior endplates as well as the anterior and posterior margins of the vertebra. No neural impingement. L1-2:  Normal. L2-3: Disc desiccation with slight disc space narrowing with a small broad-based disc bulge without neural impingement. L3-4: Disc desiccation. Small broad-based disc protrusion slightly asymmetric to the right compressing the anterior aspect of the thecal sac but without focal neural impingement. L4-5: Small broad-based soft disc protrusion slightly asymmetric to the left. Combine with hypertrophy of the left ligamentum flavum the left lateral recess is encroached upon and could affect the left L5 nerve, best seen on image 36 of series 6. L5-S1: Small broad-based disc bulge. Small focal soft disc extrusion extending superiorly behind the body of L5 on the left best seen on images 40 and 41 of series 7. This could  affect the left L5 nerve. IMPRESSION: 1. Acute or subacute benign-appearing compression fractures of T11 and T12. No neural impingement at either level. 2. Small broad-based soft disc protrusion at L4-5 with encroachment upon the left lateral recess which might affect the left L5 nerve. 3. Small soft disc extrusion to the left at L5-S1 above the disc space. This might affect the left L5 nerve. Electronically Signed   By: Lorriane Shire M.D.   On: 11/19/2017 10:52   US Venous Img Upper Uni Right  Result  Date: 12/03/2017 CLINICAL DATA:  Right upper extremity bladder/right upper extremity edema for the past 3-4 days. History of right basilic approach upper extremity PICC line. History of bladder cancer. Evaluate for DVT. EXAM: RIGHT UPPER EXTREMITY VENOUS DOPPLER ULTRASOUND TECHNIQUE: Gray-scale sonography with graded compression, as well as color Doppler and duplex ultrasound were performed to evaluate the upper extremity deep venous system from the level of the subclavian vein and including the jugular, axillary, basilic, radial, ulnar and upper cephalic vein. Spectral Doppler was utilized to evaluate flow at rest and with distal augmentation maneuvers. COMPARISON:  None. FINDINGS: Contralateral Subclavian Vein: Respiratory phasicity is normal and symmetric with the symptomatic side. No evidence of thrombus. Normal compressibility. Internal Jugular Vein: No evidence of thrombus. Normal compressibility, respiratory phasicity and response to augmentation. Subclavian Vein: No evidence of thrombus. Normal compressibility, respiratory phasicity and response to augmentation. A PICC line is seen within the right subclavian vein (images 10 and 19). Axillary Vein: No evidence of thrombus. Normal compressibility, respiratory phasicity and response to augmentation. A PICC line is seen within the right axillary vein (images 22 and 23). Cephalic Vein: No evidence of thrombus. Normal compressibility, respiratory phasicity and response to augmentation. A PICC line is seen within the right basilic vein (images 59 and 60). Basilic Vein: No evidence of thrombus. Normal compressibility, respiratory phasicity and response to augmentation. Brachial Veins: No evidence of thrombus. Normal compressibility, respiratory phasicity and response to augmentation. Radial Veins: No evidence of thrombus. Normal compressibility, respiratory phasicity and response to augmentation. Ulnar Veins: No evidence of thrombus. Normal compressibility,  respiratory phasicity and response to augmentation. Venous Reflux:  None visualized. Other Findings: There is a moderate amount of subcutaneous edema at the level of the elbow and forearm (images 63 and 64). IMPRESSION: No evidence of DVT within the right upper extremity. Specifically, there is no evidence of DVT associated with the right basilic vein approach PICC line. Electronically Signed   By: Sandi Mariscal M.D.   On: 12/03/2017 13:53   Dg Chest Port 1 View  Result Date: 12/05/2017 CLINICAL DATA:  Cough with tachycardia EXAM: PORTABLE CHEST 1 VIEW COMPARISON:  Chest radiograph and chest CT November 30, 2017 FINDINGS: Central catheter tip is in the superior vena cava near the cavoatrial junction. No evident pneumothorax. There is minimal atelectasis in the right base. The lungs elsewhere are clear. Heart size and pulmonary vascular normal. No adenopathy. There is aortic atherosclerosis. There is degenerative change in each shoulder. IMPRESSION: Slight right base atelectasis. No edema or consolidation. Stable cardiac silhouette. There is aortic atherosclerosis. Central catheter as described without pneumothorax. Aortic Atherosclerosis (ICD10-I70.0). Electronically Signed   By: Lowella Grip III M.D.   On: 12/05/2017 10:29   Dg Chest Port 1 View  Result Date: 11/30/2017 CLINICAL DATA:  Shortness of Breath EXAM: PORTABLE CHEST 1 VIEW COMPARISON:  Chest radiograph November 16, 2017 and chest CT November 16, 2017 FINDINGS: There is scarring in the lateral right base. There  is no edema or consolidation. The heart size and pulmonary vascularity are normal. No adenopathy. No evident bone lesions. IMPRESSION: Scarring lateral right base. No edema or consolidation. Stable cardiac silhouette. Electronically Signed   By: Lowella Grip III M.D.   On: 11/30/2017 12:58   Ct Angio Chest/abd/pel For Dissection W And/or Wo Contrast  Result Date: 11/16/2017 CLINICAL DATA:  Chest and low back pain, initial  encounter EXAM: CT ANGIOGRAPHY CHEST, ABDOMEN AND PELVIS TECHNIQUE: Multidetector CT imaging through the chest, abdomen and pelvis was performed using the standard protocol during bolus administration of intravenous contrast. Multiplanar reconstructed images and MIPs were obtained and reviewed to evaluate the vascular anatomy. CONTRAST:  153mL ISOVUE-370 IOPAMIDOL (ISOVUE-370) INJECTION 76% COMPARISON:  None. FINDINGS: CTA CHEST FINDINGS Cardiovascular: The thoracic aorta and its branches demonstrate mild atherosclerotic changes. No aneurysmal dilatation or dissection is identified. Coronary calcifications are seen. No cardiac enlargement is seen. The pulmonary artery as visualized shows no large central embolus. Mediastinum/Nodes: Thoracic inlet is within normal limits. No hilar or mediastinal adenopathy is identified. The esophagus is within normal limits. Lungs/Pleura: Diffuse emphysematous changes are identified. Mild atelectatic changes are noted in the right lower lobe and lingula. No focal infiltrate or sizable parenchymal nodule is noted. No pleural effusion is seen. Musculoskeletal: There compression deformities of T5-T6 and T12 identified. These are new from June of 2018 but unchanged from the recent chest x-ray. They have a chronic appearance given significant sclerosis. There is also a fracture of the T5 spinous process identified. Review of the MIP images confirms the above findings. CTA ABDOMEN AND PELVIS FINDINGS VASCULAR Aorta: Diffuse atherosclerotic changes of the aorta are noted without aneurysmal dilatation. Celiac: Mild narrowing is noted proximally with poststenotic dilatation. Atherosclerotic changes are identified. SMA: Mild atherosclerotic changes are noted. No focal stenosis is seen. Renals: Proximal atherosclerotic changes are noted without focal stenosis. IMA: Patent without evidence of aneurysm, dissection, vasculitis or significant stenosis. Iliacs: Diffuse atherosclerotic changes are  noted without aneurysmal dilatation or focal stenosis. Veins: No obvious venous abnormality within the limitations of this arterial phase study. Review of the MIP images confirms the above findings. NON-VASCULAR Hepatobiliary: Diffuse decreased attenuation is noted consistent with fatty infiltration. Gallbladder is well distended without cholelithiasis. Pancreas: Unremarkable. No pancreatic ductal dilatation or surrounding inflammatory changes. Spleen: Normal in size without focal abnormality. Adrenals/Urinary Tract: Adrenal glands are within normal limits. Nonobstructing 2 mm stone is noted in the lower pole of the right kidney. Bladder is well distended. Stomach/Bowel: The appendix is within normal limits without inflammatory change. No obstructive or inflammatory changes in the large and small bowel are noted. Lymphatic: No significant lymphadenopathy is noted. Reproductive: Prostatic calcifications are seen. Other: No abdominal wall hernia or abnormality. No abdominopelvic ascites. Musculoskeletal: Degenerative changes of lumbar spine are noted. No acute compression deformity is seen. Review of the MIP images confirms the above findings. IMPRESSION: No evidence of acute aortic abnormality. Diffuse atherosclerotic changes are noted. Diffuse emphysematous changes. Multiple compression deformities which appear chronic in nature as described. A chronic appearing T5 spinous process fracture is noted as well. Nonobstructing right renal stone. Chronic changes as described above. Electronically Signed   By: Inez Catalina M.D.   On: 11/16/2017 15:19    Time Spent in minutes  25   Leyton Magoon M.D on 12/10/2017 at 10:26 AM  Between 7am to 7pm - Pager - [phone]  After 7pm go to [email or web address] - password [facility]  [facility] -  Office  [phone]

## 2017-12-10 NOTE — Progress Notes (Signed)
Willie Williamson  MRN: [id number]  DOB/AGE: 06/05/1931 84 y.o.  Primary Care Physician:Burdine, Virgina Evener, MD  Admit date: 11/30/2017  Chief Complaint:  Chief Complaint  Patient presents with  . Tachycardia    S-Pt presented on  11/30/2017 with  Chief Complaint  Patient presents with  . Tachycardia  .    Pt resting comfortably, voices no new concerns.   Meds . carvedilol  6.25 mg Oral BID WC  . enoxaparin (LOVENOX) injection  40 mg Subcutaneous Q24H  . feeding supplement (ENSURE ENLIVE)  237 mL Oral BID BM  . feeding supplement (PRO-STAT SUGAR FREE 64)  30 mL Oral BID  . furosemide  20 mg Intravenous BID  . guaiFENesin  1,200 mg Oral BID  . ipratropium  0.5 mg Nebulization TID  . levalbuterol  0.63 mg Nebulization TID  . levothyroxine  88 mcg Oral QAC breakfast  . methylPREDNISolone (SOLU-MEDROL) injection  40 mg Intravenous Q12H  . mometasone-formoterol  2 puff Inhalation BID  . multivitamin with minerals  1 tablet Oral Daily  . nystatin  5 mL Oral QID  . potassium chloride  20 mEq Oral BID  . pravastatin  20 mg Oral q1800  . sodium chloride  1 g Oral TID WC  . tamsulosin  0.4 mg Oral QPC supper       Physical Exam: Vital signs in last 24 hours: Temp:  [98.1 F (36.7 C)-98.4 F (36.9 C)] 98.2 F (36.8 C) (01/21 0453) Pulse Rate:  [93-118] 93 (01/21 0453) Resp:  [18] 18 (01/21 0453) BP: (98-129)/(62-75) 129/75 (01/21 0453) SpO2:  [94 %-97 %] 95 % (01/21 0831) Weight change:  Last BM Date: 12/09/17  Intake/Output from previous day: 01/20 0701 - 01/21 0700 In: 880 [P.O.:480; IV Piggyback:400] Out: 1400 [Urine:1400] No intake/output data recorded.   Physical Exam: General- pt is awake,alert, oriented to time place and person Resp- No acute REsp distress,  Rhonchi+ CVS- S1S2 regular in rate and rhythm GIT- BS+, soft, NT, ND EXT- 1+ LE Edema, Cyanosis   Lab Results: HGb 11.1  BMET Recent Labs    12/09/17 0638 12/10/17 0623  NA 128* 129*  K 4.1  4.0  CL 86* 88*  CO2 33* 32  GLUCOSE 167* 157*  BUN 33* 38*  CREATININE 0.59* 0.62  CALCIUM 8.2* 7.9*   Sodium 2019 120=> 126=> 120=> 123=>124=>125=>128=>129 MICRO Recent Results (from the past 240 hour(s))  Culture, blood (Routine x 2)     Status: None   Collection Time: 11/30/17 12:42 PM  Result Value Ref Range Status   Specimen Description BLOOD PICC LINE  Final   Special Requests   Final    BOTTLES DRAWN AEROBIC AND ANAEROBIC Blood Culture adequate volume   Culture NO GROWTH 5 DAYS  Final   Report Status 12/05/2017 FINAL  Final  Culture, blood (Routine x 2)     Status: None   Collection Time: 11/30/17 12:59 PM  Result Value Ref Range Status   Specimen Description BLOOD LEFT ARM  Final   Special Requests   Final    BOTTLES DRAWN AEROBIC AND ANAEROBIC Blood Culture adequate volume   Culture NO GROWTH 5 DAYS  Final   Report Status 12/05/2017 FINAL  Final  Culture, Urine     Status: None   Collection Time: 11/30/17  9:40 PM  Result Value Ref Range Status   Specimen Description URINE, CLEAN CATCH  Final   Special Requests NONE  Final   Culture   Final  NO GROWTH Performed at [facility], [place] [address], [place], [place] [address]    Report Status 12/02/2017 FINAL  Final      Lab Results  Component Value Date   CALCIUM 7.9 (L) 12/10/2017   PHOS 2.2 (L) 12/05/2017         Impression: 1)Hyponatremia        Hypovolemic at the time of presentataion        Now pt had hypervolemia vs SIADH        On Diuretics.        Sodium improving slowly.  2)HTN  Medication-  On Diuretics On Alpha and beta Blockers   3)Anemia HGb at goal (9--11)  4)CHF PT is on diuretics  5)COPD Primary MD following  6)Hypokalemic Sec to diuretics Now beter  7)Acid base Co2 at goal   8) Pharmacy Vanco dose high      Agree with holding vanco   Plan:  Will continue current tx    Dakin Madani S 12/10/2017, 9:57 AM

## 2017-12-10 NOTE — Progress Notes (Signed)
Pharmacy Antibiotic Note  Willie Williamson is a 84 y.o. male admitted on 11/30/2017 with bacteremia and endocarditis  Pharmacy has been consulted for Vancomycin dosing. Pt remains afebrile.    Plan: Vanco trough 1/21 is 29 Change Vancomycin to 1500mg  IV every 24  hours.  Goal trough 15-20 mcg/mL. F/u cxs and clinical progress Monitor V/S, labs and levels as indicated   Height: 5\' 7"  (170.2 cm) Weight: 144 lb 6.4 oz (65.5 kg) IBW/kg (Calculated) : 66.1  Temp (24hrs), Avg:98.3 F (36.8 C), Min:98.2 F (36.8 C), Max:98.4 F (36.9 C)  Recent Labs  Lab 12/06/17 0622 12/07/17 0502 12/08/17 0413 12/09/17 0109 12/10/17 0623 12/10/17 1325  WBC 11.6* 12.4*  --   --  11.7*  --   CREATININE 0.58* 0.58* 0.52* 0.59* 0.62  --   VANCOTROUGH  --   --   --   --   --  29*    Estimated Creatinine Clearance: 63.7 mL/min (by C-G formula based on SCr of 0.62 mg/dL).    Allergies  Allergen Reactions  . Penicillins Rash    Has patient had a PCN reaction causing immediate rash, facial/tongue/throat swelling, SOB or lightheadedness with hypotension: Yes Has patient had a PCN reaction causing severe rash involving mucus membranes or skin necrosis: No Has patient had a PCN reaction that required hospitalization: No Has patient had a PCN reaction occurring within the last 10 years: No If all of the above answers are "NO", then may proceed with Cephalosporin use.     Antimicrobials this admission: *12/28 Vancomycin>>OPAT Vancomycin for 6 weeks (Thru 12/30/2017) 1/11 Aztreonam 2gm x 1 dose Dose changes: 1/4 Vancomycin dose change to 1500mg  daily as outpt(previous therapeutic dose 1gm IV q12h) 1/11 per [facility] RN dose changed 2000mg  daily b/c level subtherapeutic(9.7) 1/11 Vancomycin 1gm IV q12h 1/21 Vancomycin 1500 mg IV Q24 hours Microbiology: 1/11 Bcx: ngtd 1/11 UCx: no growth 12/31 Repeat BCX: ngtd 12/30 Repeat BCx: MRSA 12/28 BCx: MRSA  Thank you for allowing pharmacy to be a part of this  patient's care.  Margot Ables, PharmD Clinical Pharmacist 12/10/2017 2:50 PM

## 2017-12-10 NOTE — Care Management Important Message (Signed)
Important Message  Patient Details  Name: Willie Williamson MRN: [id number] Date of Birth: 1931-08-13   Medicare Important Message Given:  Yes    Jacier Gladu, Chauncey Reading, RN 12/10/2017, 10:12 AM

## 2017-12-10 NOTE — Progress Notes (Signed)
CRITICAL VALUE ALERT  Critical Value:  Vanc Trough- 29  Date & Time Notied: 12/11/17 @ 8412.  Provider Notified: Dhungel, MD.  Orders Received/Actions taken: Hold 1400 Vanc. Dosing.

## 2017-12-11 ENCOUNTER — Encounter (HOSPITAL_COMMUNITY): Payer: Self-pay | Admitting: Primary Care

## 2017-12-11 DIAGNOSIS — Z7189 Other specified counseling: Secondary | ICD-10-CM

## 2017-12-11 DIAGNOSIS — R1312 Dysphagia, oropharyngeal phase: Secondary | ICD-10-CM | POA: Diagnosis not present

## 2017-12-11 DIAGNOSIS — R269 Unspecified abnormalities of gait and mobility: Secondary | ICD-10-CM | POA: Diagnosis not present

## 2017-12-11 DIAGNOSIS — E876 Hypokalemia: Secondary | ICD-10-CM | POA: Diagnosis not present

## 2017-12-11 DIAGNOSIS — M6281 Muscle weakness (generalized): Secondary | ICD-10-CM | POA: Diagnosis not present

## 2017-12-11 DIAGNOSIS — E440 Moderate protein-calorie malnutrition: Secondary | ICD-10-CM | POA: Diagnosis not present

## 2017-12-11 DIAGNOSIS — E039 Hypothyroidism, unspecified: Secondary | ICD-10-CM | POA: Diagnosis not present

## 2017-12-11 DIAGNOSIS — J9611 Chronic respiratory failure with hypoxia: Secondary | ICD-10-CM | POA: Diagnosis not present

## 2017-12-11 DIAGNOSIS — R7881 Bacteremia: Secondary | ICD-10-CM | POA: Diagnosis not present

## 2017-12-11 DIAGNOSIS — Z7401 Bed confinement status: Secondary | ICD-10-CM | POA: Diagnosis not present

## 2017-12-11 DIAGNOSIS — E871 Hypo-osmolality and hyponatremia: Secondary | ICD-10-CM

## 2017-12-11 DIAGNOSIS — I10 Essential (primary) hypertension: Secondary | ICD-10-CM | POA: Diagnosis not present

## 2017-12-11 DIAGNOSIS — R279 Unspecified lack of coordination: Secondary | ICD-10-CM | POA: Diagnosis not present

## 2017-12-11 DIAGNOSIS — R69 Illness, unspecified: Secondary | ICD-10-CM | POA: Diagnosis not present

## 2017-12-11 DIAGNOSIS — I330 Acute and subacute infective endocarditis: Secondary | ICD-10-CM | POA: Diagnosis not present

## 2017-12-11 DIAGNOSIS — Z515 Encounter for palliative care: Secondary | ICD-10-CM | POA: Diagnosis not present

## 2017-12-11 DIAGNOSIS — I5031 Acute diastolic (congestive) heart failure: Secondary | ICD-10-CM | POA: Diagnosis not present

## 2017-12-11 DIAGNOSIS — E8770 Fluid overload, unspecified: Secondary | ICD-10-CM | POA: Diagnosis not present

## 2017-12-11 DIAGNOSIS — J449 Chronic obstructive pulmonary disease, unspecified: Secondary | ICD-10-CM | POA: Diagnosis not present

## 2017-12-11 DIAGNOSIS — R627 Adult failure to thrive: Secondary | ICD-10-CM | POA: Diagnosis not present

## 2017-12-11 DIAGNOSIS — J438 Other emphysema: Secondary | ICD-10-CM | POA: Diagnosis not present

## 2017-12-11 DIAGNOSIS — J441 Chronic obstructive pulmonary disease with (acute) exacerbation: Secondary | ICD-10-CM | POA: Diagnosis not present

## 2017-12-11 DIAGNOSIS — B9562 Methicillin resistant Staphylococcus aureus infection as the cause of diseases classified elsewhere: Secondary | ICD-10-CM | POA: Diagnosis not present

## 2017-12-11 LAB — GLUCOSE, CAPILLARY
GLUCOSE-CAPILLARY: 144 mg/dL — AB (ref 65–99)
GLUCOSE-CAPILLARY: 174 mg/dL — AB (ref 65–99)

## 2017-12-11 LAB — BASIC METABOLIC PANEL
ANION GAP: 12 (ref 5–15)
BUN: 45 mg/dL — ABNORMAL HIGH (ref 6–20)
CHLORIDE: 89 mmol/L — AB (ref 101–111)
CO2: 30 mmol/L (ref 22–32)
Calcium: 8 mg/dL — ABNORMAL LOW (ref 8.9–10.3)
Creatinine, Ser: 0.52 mg/dL — ABNORMAL LOW (ref 0.61–1.24)
GFR calc non Af Amer: >60 mL/min (ref >60)
Glucose, Bld: 156 mg/dL — ABNORMAL HIGH (ref 65–99)
Potassium: 3.7 mmol/L (ref 3.5–5.1)
SODIUM: 131 mmol/L — AB (ref 135–145)

## 2017-12-11 MED ORDER — TORSEMIDE 20 MG PO TABS: 20.0000 mg | ORAL_TABLET | Freq: Every day | ORAL | 0 refills | Status: AC

## 2017-12-11 MED ORDER — TORSEMIDE 20 MG PO TABS: 40.0000 mg | ORAL_TABLET | Freq: Every morning | ORAL | 0 refills | Status: AC

## 2017-12-11 MED ORDER — LEVALBUTEROL HCL 0.63 MG/3ML IN NEBU: 0.6300 mg | INHALATION_SOLUTION | Freq: Four times a day (QID) | RESPIRATORY_TRACT | 12 refills | Status: AC | PRN

## 2017-12-11 MED ORDER — HYDROCODONE-ACETAMINOPHEN 5-325 MG PO TABS: 1.0000 | ORAL_TABLET | Freq: Four times a day (QID) | ORAL | 0 refills | Status: AC | PRN

## 2017-12-11 MED ORDER — IPRATROPIUM BROMIDE 0.02 % IN SOLN: 0.5000 mg | Freq: Three times a day (TID) | RESPIRATORY_TRACT | 12 refills | Status: AC

## 2017-12-11 MED ORDER — METHYLPREDNISOLONE 8 MG PO TABS: 8.0000 mg | ORAL_TABLET | Freq: Every day | ORAL | 0 refills | Status: AC

## 2017-12-11 MED ORDER — FUROSEMIDE 20 MG PO TABS: 20.0000 mg | ORAL_TABLET | Freq: Every day | ORAL | Status: DC

## 2017-12-11 NOTE — NC FL2 (Signed)
[place] LEVEL OF CARE SCREENING TOOL     IDENTIFICATION  Patient Name: Willie Williamson Birthdate: [birth year] Sex: male Admission Date (Current Location): 11/30/2017  [facility] and [place] Number:  [employer] and Address:  [place] [address] [place]      Provider Number: [id number]  Attending Physician Name and Address:  Deatra James, MD  Relative Name and Phone Number:       Current Level of Care: SNF Recommended Level of Care: [place] Prior Approval Number:    Date Approved/Denied:   PASRR Number: [id number])  Discharge Plan: SNF    Current Diagnoses: Patient Active Problem List   Diagnosis Date Noted  . Hyponatremia 11/30/2017  . 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 Lumbar compression fracture, sequela 11/22/2017  . BPH (benign prostatic hyperplasia) 11/22/2017  . Weakness generalized 11/22/2017  . MRSA bacteremia 11/17/2017  . Tachycardia 11/16/2017  . Dyspnea 08/08/2017  . Chronic respiratory failure with hypoxia ([place]) 11/09/2015  . Preoperative clearance 08/11/2015  . Unstable angina ([place]) 04/17/2014  . Nonspecific abnormal unspecified cardiovascular function study 04/16/2014  . TINEA CORPORIS 04/20/2010  . Essential hypertension 09/06/2009  . BLADDER CANCER 01/10/2008  . HYPERLIPIDEMIA 01/10/2008  . Coronary atherosclerosis 01/10/2008  . EMPHYSEMA 10/10/2007  . COPD with emphysema ([place]) 10/10/2007    Orientation RESPIRATION BLADDER Height & Weight     Self, Time, Situation, Place  O2(3L) Continent Weight: 144 lb 6.4 oz (65.5 kg) Height:  5\' 7"  (170.2 cm)  BEHAVIORAL SYMPTOMS/MOOD NEUROLOGICAL BOWEL NUTRITION STATUS      Continent Diet(Heart. Fluid restriction: No free water or ice. Liberalize salt intake)  AMBULATORY STATUS COMMUNICATION OF NEEDS Skin   Extensive Assist Verbally Normal                       Personal Care Assistance Level of  Assistance  Feeding, Dressing, Bathing Bathing Assistance: Limited assistance Feeding assistance: Independent Dressing Assistance: Limited assistance     Functional Limitations Info  Sight, Hearing, Speech Sight Info: Adequate Hearing Info: Adequate Speech Info: Adequate    SPECIAL CARE FACTORS FREQUENCY  PT (By licensed PT)     PT Frequency: 5x/week              Contractures      Additional Factors Info  Code Status, Allergies, Isolation Precautions Code Status Info: DNR Allergies Info: Penicillins           Current Medications (12/11/2017):  This is the current hospital active medication list Current Facility-Administered Medications  Medication Dose Route Frequency Provider Last Rate Last Dose  . acetaminophen (TYLENOL) tablet 650 mg  650 mg Oral Q6H PRN Emokpae, Ejiroghene E, MD       Or  . acetaminophen (TYLENOL) suppository 650 mg  650 mg Rectal Q6H PRN Emokpae, Ejiroghene E, MD      . alum & mag hydroxide-simeth (MAALOX/MYLANTA) 200-200-20 MG/5ML suspension 30 mL  30 mL Oral Q4H PRN Oswald Hillock, MD   30 mL at 12/03/17 0503  . carvedilol (COREG) tablet 6.25 mg  6.25 mg Oral BID WC Kathie Dike, MD   6.25 mg at 12/11/17 0757  . enoxaparin (LOVENOX) injection 40 mg  40 mg Subcutaneous Q24H Emokpae, Ejiroghene E, MD   40 mg at 12/10/17 2056  . feeding supplement (ENSURE ENLIVE) (ENSURE ENLIVE) liquid 237 mL  237 mL Oral BID BM Shah, Pratik D, DO   237 mL at  12/11/17 0801  . feeding supplement (PRO-STAT SUGAR FREE 64) liquid 30 mL  30 mL Oral BID Manuella Ghazi, Pratik D, DO   30 mL at 12/11/17 0800  . furosemide (LASIX) tablet 20 mg  20 mg Oral Daily Befekadu, Eli Phillips, MD      . guaiFENesin (MUCINEX) 12 hr tablet 1,200 mg  1,200 mg Oral BID Kathie Dike, MD   1,200 mg at 12/11/17 0800  . HYDROcodone-acetaminophen (NORCO/VICODIN) 5-325 MG per tablet 1 tablet  1 tablet Oral Q6H PRN Emokpae, Ejiroghene E, MD      . ipratropium (ATROVENT) nebulizer solution 0.5 mg  0.5 mg  Nebulization Q6H PRN Oswald Hillock, MD   0.5 mg at 12/04/17 0844  . ipratropium (ATROVENT) nebulizer solution 0.5 mg  0.5 mg Nebulization TID Manuella Ghazi, Pratik D, DO   0.5 mg at 12/11/17 0759  . levalbuterol (XOPENEX) nebulizer solution 0.63 mg  0.63 mg Nebulization Q6H PRN Oswald Hillock, MD   0.63 mg at 12/06/17 0125  . levalbuterol (XOPENEX) nebulizer solution 0.63 mg  0.63 mg Nebulization TID Manuella Ghazi, Pratik D, DO   0.63 mg at 12/11/17 0759  . levothyroxine (SYNTHROID, LEVOTHROID) tablet 88 mcg  88 mcg Oral QAC breakfast Emokpae, Ejiroghene E, MD   88 mcg at 12/11/17 0757  . methylPREDNISolone sodium succinate (SOLU-MEDROL) 40 mg/mL injection 40 mg  40 mg Intravenous Q12H Shah, Pratik D, DO   40 mg at 12/11/17 0002  . mometasone-formoterol (DULERA) 200-5 MCG/ACT inhaler 2 puff  2 puff Inhalation BID Emokpae, Ejiroghene E, MD   2 puff at 12/11/17 0804  . multivitamin with minerals tablet 1 tablet  1 tablet Oral Daily Manuella Ghazi, Pratik D, DO   1 tablet at 12/11/17 0800  . nystatin (MYCOSTATIN) 100000 UNIT/ML suspension 500,000 Units  5 mL Oral QID Manuella Ghazi, Pratik D, DO   500,000 Units at 12/11/17 0801  . pravastatin (PRAVACHOL) tablet 20 mg  20 mg Oral q1800 Emokpae, Ejiroghene E, MD   20 mg at 12/10/17 1654  . promethazine (PHENERGAN) tablet 12.5 mg  12.5 mg Oral Q6H PRN Emokpae, Ejiroghene E, MD   12.5 mg at 12/05/17 1632  . sodium chloride tablet 1 g  1 g Oral TID WC Fran Lowes, MD   1 g at 12/11/17 0757  . tamsulosin (FLOMAX) capsule 0.4 mg  0.4 mg Oral QPC supper Emokpae, Ejiroghene E, MD   0.4 mg at 12/10/17 1654  . vancomycin (VANCOCIN) 1,500 mg in sodium chloride 0.9 % 500 mL IVPB  1,500 mg Intravenous Q24H Ramond Craver, [facility]   Stopped at 12/11/17 0202     Discharge Medications: Please see discharge summary for a list of discharge medications.  Relevant Imaging Results:  Relevant Lab Results:   Additional Information PATIENT WILL NEED IV ANTIBIOTICS THROUGH 12/30/17. MRSA + by PCR  11/16/2017; MRSA + blood culture 11/16/2017 and 11/18/2017  Damoni Causby, Clydene Pugh, LCSW

## 2017-12-11 NOTE — Progress Notes (Signed)
Report called and given to Maudie Mercury, RN at [facility]. EMS to be called for transport of patient.

## 2017-12-11 NOTE — Progress Notes (Signed)
Subjective: Interval History: Patient does not have any new complaints.  Denies any difficulty breathing.  Objective: Vital signs in last 24 hours: Temp:  [97.7 F (36.5 C)-98.4 F (36.9 C)] 97.7 F (36.5 C) (01/22 0553) Pulse Rate:  [91-97] 97 (01/22 0553) Resp:  [18-20] 20 (01/22 0553) BP: (117-130)/(69-85) 124/72 (01/22 0553) SpO2:  [94 %-97 %] 97 % (01/22 0804) Weight change:   Intake/Output from previous day: 01/21 0701 - 01/22 0700 In: 980 [P.O.:480; IV Piggyback:500] Out: 1650 [Urine:1650] Intake/Output this shift: No intake/output data recorded.  Generally patient is alert and in no apparent distress Chest: Decreased breath sound bilaterally Heart exam revealed regular rate and rhythm Extremities trace edema  Lab Results: Recent Labs    12/10/17 0623  WBC 11.7*  HGB 10.4*  HCT 31.8*  PLT 204   BMET:  Recent Labs    12/10/17 0623 12/11/17 0612  NA 129* 131*  K 4.0 3.7  CL 88* 89*  CO2 32 30  GLUCOSE 157* 156*  BUN 38* 45*  CREATININE 0.62 0.52*  CALCIUM 7.9* 8.0*   No results for input(s): PTH in the last 72 hours. Iron Studies: No results for input(s): IRON, TIBC, TRANSFERRIN, FERRITIN in the last 72 hours.  Studies/Results: No results found.  I have reviewed the patient's current medications.  Assessment/Plan: 1] hyponatremia: This is secondary to hypervolemic hyponatremia.  Patient on  free water restriction and sodium supplement his sodium is progressively improving.  Is 131 today. 2] hypokalemia: Most likely due to diuretics.  His potassium is normal 3] CHF: Patient is on Lasix.  He had about 1600 cc of urine output. 4] history of COPD: Patient is on a steroid and albuterol inhaler. 5] hypertension: His blood pressure is reasonably controlled 6] coronary artery disease: Patient presently does not have any chest pain 7] history of bladder cancer. Plan: 1] continue his present management 2] will change Lasix to 20 mg p.o. once a day 3]  since his sodium has come up to a reasonable level I will sign off and thank you for letting me participate in his care.     LOS: 11 days   Freddi Forster S 12/11/2017,8:28 AM

## 2017-12-11 NOTE — Clinical Social Work Placement (Signed)
   CLINICAL SOCIAL WORK PLACEMENT  NOTE  Date:  12/11/2017  Patient Details  Name: Willie Williamson MRN: [id number] Date of Birth: [birth year]  Clinical Social Work is seeking post-discharge placement for this patient at the [place] level of care (*CSW will initial, date and re-position this form in  chart as items are completed):  Yes   Patient/family provided with [facility] Work Department's list of facilities offering this level of care within the geographic area requested by the patient (or if unable, by the patient's family).  Yes   Patient/family informed of their freedom to choose among providers that offer the needed level of care, that participate in Medicare, Medicaid or managed care program needed by the patient, have an available bed and are willing to accept the patient.  Yes   Patient/family informed of [facility]'s ownership interest in [facility] and [facility], as well as of the fact that they are under no obligation to receive care at these facilities.  PASRR submitted to E[employer] on 12/03/17     PASRR number received on 12/03/17     Existing PASRR number confirmed on       FL2 transmitted to all facilities in geographic area requested by pt/family on 12/03/17     FL2 transmitted to all facilities within larger geographic area on       Patient informed that his/her managed care company has contracts with or will negotiate with certain facilities, including the following:        Yes   Patient/family informed of bed offers received.  Patient chooses bed at [facility]     Physician recommends and patient chooses bed at      Patient to be transferred to [facility] on 12/11/17.  Patient to be transferred to facility by [facility]     Patient family notified on 12/11/17 of transfer.  Name of family member notified:  Danise Mina, son     PHYSICIAN       Additional Comment:  Discharge clinicals sent and  facility notified.  LCSW signing off.   _______________________________________________ Ihor Gully, LCSW 12/11/2017, 4:30 PM

## 2017-12-11 NOTE — Consult Note (Signed)
Consultation Note Date: 12/11/2017   Patient Name: Willie Williamson  DOB: July 04, 1931  MRN: [id number]  Age / Sex: 84 y.o., male  PCP: Pleas Koch Virgina Evener, MD Referring Physician: Deatra James, MD  Reason for Consultation: Establishing goals of care and Psychosocial/spiritual support  HPI/Patient Profile: 84 y.o. male  with past medical history of COPD with chronic respiratory failure, home oxygen, coronary artery disease, history of bladder cancer, recent hospitalization for MRSA bacteremia discharge 1 weeks ago admitted on 11/30/2017 with hyponatremia, acute heart failure.   Clinical Assessment and Goals of Care: Mr. Skog is resting quietly in bed.  He greets me making and keeping eye contact.  He is calm, and pleasant.  There is no family at bedside at this time.  We talked about his current health conditions including heart failure.  We also talked about disposition to SNF today for rehab and IV antibiotic administration.  Mr. Gillie shares that his wife passed away in 03-17-2011, his son lives in his home.  We talked in detail about power of attorney and CODE STATUS.  Mr. Arvidson shares that he has both these legal documents in place.  No further questions or concerns today.   Healthcare power of attorney HCPOA -Mr. Schaumburg states that his son, Alphonsus Doyel is his legal healthcare power of attorney, also his [place].   SUMMARY OF RECOMMENDATIONS   Continue to treat the treatable. Rehab for PT and IV antibiotic administration.  Code Status/Advance Care Planning:  DNR -confirmed with patient  Symptom Management:   Per hospitalist, no additional needs at this time.  Palliative Prophylaxis:   None needed at this time.  Additional Recommendations (Limitations, Scope, Preferences):  To need to treat the treatable, no CPR, no intubation.  Psycho-social/Spiritual:    Desire for further Chaplaincy support:no  Additional Recommendations: Caregiving  Support/Resources and Education on Hospice  Prognosis:   < 12 months surprising based on 2 hospitalizations in 6 months, bacteremia, history of bladder cancer,  Discharge Planning: [facility] for rehab and IV antibiotic administration.      Primary Diagnoses: Present on Admission: . Hyponatremia . Essential hypertension . COPD with emphysema ([place]) . Chronic respiratory failure with hypoxia ([place]) . MRSA bacteremia . Infective endocarditis   I have reviewed the medical record, interviewed the patient and family, and examined the patient. The following aspects are pertinent.  Past Medical History:  Diagnosis Date  . Anemia   . CAD (coronary artery disease)    a. 03/2014 - s/p DES to RCA, nonobstructive disease in LAD/Cx, EF 60%.  . Cataract, congenital    "left eye"  . COPD (chronic obstructive pulmonary disease) with emphysema ([place])    a. Chronic resp failure on home O2 (2L).  . H/O hiatal hernia   . HTN (hypertension)   . Hyperlipidemia   . Hypothyroidism   . Malignant neoplasm of bladder, part unspecified    "it was cured"    Social History   Socioeconomic History  . Marital status: Widowed  Spouse name: None  . Number of children: None  . Years of education: None  . Highest education level: None  Social Needs  . Financial resource strain: None  . Food insecurity - worry: None  . Food insecurity - inability: None  . Transportation needs - medical: None  . Transportation needs - non-medical: None  Occupational History  . Occupation: retired  Tobacco Use  . Smoking status: Former Smoker    Packs/day: 1.50    Years: 48.00    Pack years: 72.00    Types: Cigarettes    Start date: 01/03/1949    Last attempt to quit: 11/20/1996    Years since quitting: 21.0  . Smokeless tobacco: Never Used  Substance and Sexual Activity  . Alcohol use: No    Alcohol/week: 0.0 oz  . Drug  use: No  . Sexual activity: Not Currently  Other Topics Concern  . None  Social History Narrative   Originally from [place]. In the [employer] he was in [place], [place], & [place]. 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 No pets currently. Remote bird exposure. No mold exposure.    Family History  Problem Relation Age of Onset  . Bone cancer Mother   . Hypertension Father   . Lung disease Neg Hx    Scheduled Meds: . carvedilol  6.25 mg Oral BID WC  . enoxaparin (LOVENOX) injection  40 mg Subcutaneous Q24H  . feeding supplement (ENSURE ENLIVE)  237 mL Oral BID BM  . feeding supplement (PRO-STAT SUGAR FREE 64)  30 mL Oral BID  . furosemide  20 mg Oral Daily  . guaiFENesin  1,200 mg Oral BID  . ipratropium  0.5 mg Nebulization TID  . levalbuterol  0.63 mg Nebulization TID  . levothyroxine  88 mcg Oral QAC breakfast  . methylPREDNISolone (SOLU-MEDROL) injection  40 mg Intravenous Q12H  . mometasone-formoterol  2 puff Inhalation BID  . multivitamin with minerals  1 tablet Oral Daily  . nystatin  5 mL Oral QID  . pravastatin  20 mg Oral q1800  . sodium chloride  1 g Oral TID WC  . tamsulosin  0.4 mg Oral QPC supper   Continuous Infusions: . vancomycin Stopped (12/11/17 0202)   PRN Meds:.acetaminophen **OR** acetaminophen, alum & mag hydroxide-simeth, HYDROcodone-acetaminophen, ipratropium, levalbuterol, promethazine Medications Prior to Admission:  Prior to Admission medications   Medication Sig Start Date End Date Taking? Authorizing Provider  albuterol (PROAIR HFA) 108 (90 Base) MCG/ACT inhaler INHALE 2 PUFFS INTO THE LUNGS EVERY 4 (FOUR) HOURS AS NEEDED FOR WHEEZING OR SHORTNESS OF BREATH. 01/30/17  Yes Javier Glazier, MD  aspirin 81 MG tablet Take 81 mg by mouth daily.     Yes [provider]  budesonide-formoterol (SYMBICORT) 160-4.5 MCG/ACT inhaler Inhale 2 puffs into the lungs 2 (two) times daily.  01/30/17  Yes Javier Glazier, MD  carvedilol (COREG) 3.125 MG tablet Take 2 tablets (6.25 mg total) by mouth 2 (two) times daily with a meal. 11/22/17  Yes Dhungel, Nishant, MD  fluticasone (FLONASE) 50 MCG/ACT nasal spray PLACE 1 SPRAY INTO BOTH NOSTRILS 2 (TWO) TIMES DAILY AS NEEDED. 07/10/16  Yes Javier Glazier, MD  GuaiFENesin (MUCINEX PO) Take 400 mg by mouth 3 (three) times daily as needed (for congestion).    Yes [provider]  HYDROcodone-acetaminophen (NORCO/VICODIN) 5-325 MG tablet Take 1 tablet by mouth every 6 (six)  hours as needed for moderate pain or severe pain. 11/22/17  Yes Dhungel, Nishant, MD  levothyroxine (SYNTHROID, LEVOTHROID) 88 MCG tablet Take 88 mcg by mouth daily before breakfast.   Yes [provider]  lovastatin (MEVACOR) 20 MG tablet Take 20 mg by mouth at bedtime.     Yes [provider]  nitroGLYCERIN (NITROSTAT) 0.4 MG SL tablet Place 1 tablet (0.4 mg total) under the tongue every 5 (five) minutes as needed for chest pain (up to 3 doses). 12/12/16  Yes [name], MD  predniSONE (DELTASONE) 10 MG tablet Take 15 mg by mouth daily.  10/29/15  Yes [provider]  tamsulosin (FLOMAX) 0.4 MG CAPS capsule Take 1 capsule (0.4 mg total) by mouth daily after supper. 11/22/17  Yes Dhungel, Nishant, MD  tiotropium (SPIRIVA HANDIHALER) 18 MCG inhalation capsule PLACE 1 CAPSULE (18 MCG TOTAL) INTO INHALER AND INHALE DAILY. 01/30/17  Yes Javier Glazier, MD  vancomycin IVPB Inject 1,000 mg into the vein every 12 (twelve) hours. Indication:  MRSA Bacteremia Last Day of Therapy:  12/30/2017 Labs - Sunday/Monday:  CBC/D, BMP, and vancomycin trough. Labs - Thursday:  BMP and vancomycin trough Labs - Every other week:  ESR and CRP Patient taking differently: Inject 2,000 mg into the vein daily. Indication:  MRSA Bacteremia Last Day of Therapy:  12/30/2017 Labs - Sunday/Monday:  CBC/D, BMP, and vancomycin trough. Labs - Thursday:  BMP and  vancomycin trough Labs - Every other week:  ESR and CRP 11/22/17  Yes Dhungel, Nishant, MD  vancomycin (VANCOCIN) 1-5 GM/200ML-% SOLN Inject 200 mLs (1,000 mg total) into the vein every 12 (twelve) hours. Patient not taking: Reported on 11/30/2017 11/22/17 12/30/17  Louellen Molder, MD   Allergies  Allergen Reactions  . Penicillins Rash    Has patient had a PCN reaction causing immediate rash, facial/tongue/throat swelling, SOB or lightheadedness with hypotension: Yes Has patient had a PCN reaction causing severe rash involving mucus membranes or skin necrosis: No Has patient had a PCN reaction that required hospitalization: No Has patient had a PCN reaction occurring within the last 10 years: No If all of the above answers are "NO", then may proceed with Cephalosporin use.    Review of Systems  Unable to perform ROS: Acuity of condition    Physical Exam  Constitutional: He is oriented to person, place, and time. No distress.  Appears frail, acutely/chronically ill  HENT:  Head: Normocephalic and atraumatic.  Pulmonary/Chest: Effort normal. No respiratory distress.  Some work of breathing noted, chronic COPD  Abdominal: Soft. He exhibits no distension.  Musculoskeletal: He exhibits no edema.  Neurological: He is alert and oriented to person, place, and time.  Skin: Skin is warm and dry.  Nursing note and vitals reviewed.   Vital Signs: BP 124/72 (BP Location: Left Arm)   Pulse 97   Temp 97.7 F (36.5 C) (Oral)   Resp 20   Ht '5\' 7"'$  (1.702 m)   Wt 65.5 kg (144 lb 6.4 oz)   SpO2 97%   BMI 22.62 kg/m  Pain Assessment: No/denies pain   Pain Score: 0-No pain   SpO2: SpO2: 97 % O2 Device:SpO2: 97 % O2 Flow Rate: .O2 Flow Rate (L/min): 3 L/min  IO: Intake/output summary:   Intake/Output Summary (Last 24 hours) at 12/11/2017 1132 Last data filed at 12/11/2017 0900 Gross per 24 hour  Intake 1100 ml  Output 1650 ml  Net -550 ml    LBM: Last BM Date: 12/09/17 Baseline  Weight: Weight: 65.5 kg (144 lb 6.4 oz) Most recent weight: Weight: 65.5 kg (144 lb 6.4 oz)     Palliative Assessment/Data:   Flowsheet Rows     Most Recent Value  Intake Tab  Referral Department  Hospitalist  Unit at Time of Referral  Med/Surg Unit  Palliative Care Primary Diagnosis  Cardiac  Date Notified  12/09/17  Palliative Care Type  New Palliative care  Reason for referral  Clarify Goals of Care  Date of Admission  11/30/17  Date first seen by Palliative Care  12/11/17  # of days Palliative referral response time  2 Day(s)  # of days IP prior to Palliative referral  9  Clinical Assessment  Psychosocial & Spiritual Assessment  Palliative Care Outcomes      Time In: 1120 Time Out: 1155 Time Total: 25 minutes Greater than 50%  of this time was spent counseling and coordinating care related to the above assessment and plan.  Signed by: Drue Novel, NP   Please contact Palliative Medicine Team phone at [phone] for questions and concerns.  For individual provider: See Shea Evans

## 2017-12-11 NOTE — Discharge Summary (Signed)
Physician Discharge Summary      Patient: Willie Williamson                   Admit date: 11/30/2017   DOB: [birth year]             Discharge date:12/11/2017/1:30 PM ACZ:[id number]                           PCP: [name], MD Recommendations for Outpatient Follow-up:  1.         Follow-up with your pulmonologist in 1-2 weeks 2. Please follow-up with your primary care physician within 1-2 weeks.   Discharge Condition: Stable  CODE STATUS: DNR/DNI Diet recommendation: Cardiac diethealthy diet  ----------------------------------------------------------------------------------------------------------------------  Discharge Diagnoses:   Principal Problem:   Hyponatremia Active Problems:   Essential hypertension   COPD with emphysema ([place])   Chronic respiratory failure with hypoxia (HCC)   MRSA bacteremia   Infective endocarditis   Palliative care encounter   Goals of care, counseling/discussion   History of present illness :  Mr. Reynald Woods is a 84 year old male with COPD with chronic respiratory failure on home O2 (2-3 L), CAD, hypertension, BPH with history of bladder cancer with recent hospitalization for MRSA bacteremia and discharged on 6 weeks of IV vancomycin until 2/10 return to the ED with generalized weakness and poor urine output with findings of severe hyponatremia (sodium of 120) initially improved with fluids but developed volume overload followed by severe hypokalemia due to diuresis. Hospital course prolonged with slow improvement in his sodium, fluid overload requiring diuresis and COPD exacerbation requiring steroids.     Hospital course / Brief Summary:  Hyponatremia -proved Combination of hypervolaemia and SIADH. Sodium slowly improving. 131 today.  Will DC IV Lasix, consult and recommended torsemide 40 mg p.o. every morning and 20 mg p.o. q. at bedtime Continue to monitor daily weight, labs q. weekly until sodium stabilizes  Acute diastolic  CHF Monitor with IV Lasix. Recent 2-D echo with EF of 55-60% Started on torsemide, daily weight  Hypokalemia Replenished.  COPD exacerbation with chronic respiratory failure Was on IV Solu-Medrol 40 mg twice a day.  Been switched to tapered prednisone  still has some  bilateral wheezing.  Continue bronchodilator treatment, Xopenex, O2 sat stable on 2-3 L.  Hypothyroidism Continue Synthroid.  Urinary retention No issues after Foley removed 2 days back. Continue Flomax.  Generalized weakness with severe deconditioning Palliative care consulted for goals of care discussion.  Resident PT OT recommended Fall precaution  Essential hypertension Continue Coreg.  Constipation Improved  MRSA bacteremia Continue IV vancomycin. Last dose on 2/10.  Code Status :  DO NOT RESUSCITATE  Disposition Plan  :  SNF today  Consultations:   Pulmonary/nephrology  Procedures: 2D echocardiogram ----------------------------------------------------------------------------------------------------------------------  Discharge Instructions:   Discharge Instructions    (HEART FAILURE PATIENTS) Call MD:  Anytime you have any of the following symptoms: 1) 3 pound weight gain in 24 hours or 5 pounds in 1 week 2) shortness of breath, with or without a dry hacking cough 3) swelling in the hands, feet or stomach 4) if you have to sleep on extra pillows at night in order to breathe.   Complete by:  As directed    Activity as tolerated - No restrictions   Complete by:  As directed    Call MD for:  difficulty breathing, headache or visual disturbances   Complete by:  As directed  Diet - low sodium heart healthy   Complete by:  As directed    Discharge instructions   Complete by:  As directed    Continue current medication, daily weight recommended, monitor p.o. intake especially fluids New diuretics per pulmonary recommendation is torsemide 40 mg in a.m. and 20 mg in p.m.   Increase  activity slowly   Complete by:  As directed        Medication List    STOP taking these medications   predniSONE 10 MG tablet Commonly known as:  DELTASONE   vancomycin 1-5 GM/200ML-% Soln Commonly known as:  VANCOCIN     TAKE these medications   albuterol 108 (90 Base) MCG/ACT inhaler Commonly known as:  PROAIR HFA INHALE 2 PUFFS INTO THE LUNGS EVERY 4 (FOUR) HOURS AS NEEDED FOR WHEEZING OR SHORTNESS OF BREATH.   aspirin 81 MG tablet Take 81 mg by mouth daily.   budesonide-formoterol 160-4.5 MCG/ACT inhaler Commonly known as:  SYMBICORT Inhale 2 puffs into the lungs 2 (two) times daily.   carvedilol 3.125 MG tablet Commonly known as:  COREG Take 2 tablets (6.25 mg total) by mouth 2 (two) times daily with a meal.   fluticasone 50 MCG/ACT nasal spray Commonly known as:  FLONASE PLACE 1 SPRAY INTO BOTH NOSTRILS 2 (TWO) TIMES DAILY AS NEEDED.   HYDROcodone-acetaminophen 5-325 MG tablet Commonly known as:  NORCO/VICODIN Take 1 tablet by mouth every 6 (six) hours as needed for moderate pain or severe pain.   ipratropium 0.02 % nebulizer solution Commonly known as:  ATROVENT Take 2.5 mLs (0.5 mg total) by nebulization 3 (three) times daily.   levalbuterol 0.63 MG/3ML nebulizer solution Commonly known as:  XOPENEX Take 3 mLs (0.63 mg total) by nebulization every 6 (six) hours as needed for wheezing or shortness of breath.   levothyroxine 88 MCG tablet Commonly known as:  SYNTHROID, LEVOTHROID Take 88 mcg by mouth daily before breakfast.   lovastatin 20 MG tablet Commonly known as:  MEVACOR Take 20 mg by mouth at bedtime.   methylPREDNISolone 8 MG tablet Commonly known as:  MEDROL Take 1 tablet (8 mg total) by mouth daily.   MUCINEX PO Take 400 mg by mouth 3 (three) times daily as needed (for congestion).   nitroGLYCERIN 0.4 MG SL tablet Commonly known as:  NITROSTAT Place 1 tablet (0.4 mg total) under the tongue every 5 (five) minutes as needed for chest pain  (up to 3 doses).   tamsulosin 0.4 MG Caps capsule Commonly known as:  FLOMAX Take 1 capsule (0.4 mg total) by mouth daily after supper.   tiotropium 18 MCG inhalation capsule Commonly known as:  SPIRIVA HANDIHALER PLACE 1 CAPSULE (18 MCG TOTAL) INTO INHALER AND INHALE DAILY.   torsemide 20 MG tablet Commonly known as:  DEMADEX Take 2 tablets (40 mg total) by mouth every morning.   torsemide 20 MG tablet Commonly known as:  DEMADEX Take 1 tablet (20 mg total) by mouth at bedtime.   vancomycin IVPB Inject 1,000 mg into the vein every 12 (twelve) hours. Indication:  MRSA Bacteremia Last Day of Therapy:  12/30/2017 Labs - Sunday/Monday:  CBC/D, BMP, and vancomycin trough. Labs - Thursday:  BMP and vancomycin trough Labs - Every other week:  ESR and CRP What changed:    how much to take  when to take this  additional instructions      Contact information for after-discharge care    Destination    [facility] .  Service:  Skilled Nursing Contact information: [place] [phone]             Allergies  Allergen Reactions  . Penicillins Rash    Has patient had a PCN reaction causing immediate rash, facial/tongue/throat swelling, SOB or lightheadedness with hypotension: Yes Has patient had a PCN reaction causing severe rash involving mucus membranes or skin necrosis: No Has patient had a PCN reaction that required hospitalization: No Has patient had a PCN reaction occurring within the last 10 years: No If all of the above answers are "NO", then may proceed with Cephalosporin use.       Procedures/Studies: Dg Chest 2 View  Result Date: 11/16/2017 CLINICAL DATA:  Tachycardia, chest pain, shortness of breath EXAM: CHEST  2 VIEW COMPARISON:  04/20/2017 FINDINGS: Lungs are essentially clear. Bibasilar scarring. No focal consolidation. No pleural effusion or pneumothorax. The heart is normal in size. Moderate  compression fracture deformity of a mid and lower vertebral body, possibly T5 and T11. Mild anterior wedging of an additional mid thoracic vertebral body, possibly T4. These findings are age indeterminate but new from the prior. IMPRESSION: No evidence of acute cardiopulmonary disease. Mild to moderate compression fracture deformities of three thoracic vertebral bodies, as described above. These findings are age indeterminate but new from 04/20/2017. Electronically Signed   By: Julian Hy M.D.   On: 11/16/2017 11:40   Ct Angio Chest Pe W And/or Wo Contrast  Result Date: 11/30/2017 CLINICAL DATA:  Tachypnea, history coronary artery disease, COPD, hypertension, bladder cancer EXAM: CT ANGIOGRAPHY CHEST WITH CONTRAST TECHNIQUE: Multidetector CT imaging of the chest was performed using the standard protocol during bolus administration of intravenous contrast. Multiplanar CT image reconstructions and MIPs were obtained to evaluate the vascular anatomy. CONTRAST:  146m ISOVUE-370 IOPAMIDOL (ISOVUE-370) INJECTION 76% IV COMPARISON:  11/16/2017 FINDINGS: Cardiovascular: Atherosclerotic calcifications aorta, proximal great vessels and coronary arteries. Aorta normal caliber without aneurysm or dissection. Small pericardial effusion. Pulmonary arteries well opacified and patent. No evidence of pulmonary embolism. Narrowing at origin of celiac artery. Mediastinum/Nodes: Esophagus unremarkable. No thoracic adenopathy. Base of cervical region normal appearance. Lungs/Pleura: Emphysematous changes with mild central peribronchial thickening. Subsegmental atelectasis RIGHT lower lobe and minimally in lingula. No acute infiltrate, pleural effusion, or pneumothorax. Upper Abdomen: Small nonobstructing calculus lower pole RIGHT kidney. Remaining visualized upper abdomen unremarkable Musculoskeletal: Bones demineralized. Compression fractures of multiple vertebral bodies including T5, T6, T12 and superior endplate L2.  Review of the MIP images confirms the above findings. IMPRESSION: No evidence of pulmonary embolism. Small pericardial effusion. COPD changes with subsegmental atelectasis in RIGHT lower lobe and minimally in lingula. Multiple thoracolumbar compression fractures. Aortic Atherosclerosis (ICD10-I70.0) and Emphysema (ICD10-J43.9). Electronically Signed   By: MLavonia DanaM.D.   On: 11/30/2017 15:07   Mr Lumbar Spine Wo Contrast  Result Date: 11/19/2017 CLINICAL DATA:  Chronic low back pain. EXAM: MRI LUMBAR SPINE WITHOUT CONTRAST TECHNIQUE: Multiplanar, multisequence MR imaging of the lumbar spine was performed. No intravenous contrast was administered. COMPARISON:  CT scan abdomen and pelvis dated 08/04/2010 and CT scans dated 11/16/2017 and chest x-rays dated 04/20/2017 and 11/16/2017 FINDINGS: Segmentation:  Standard. Alignment:  Physiologic. Vertebrae: There are acute or subacute benign appearing compression fractures involving of T11 and T12. The fracture of the superior endplate of TZ32is subtle. The fracture of T12 is more prominent. Conus medullaris and cauda equina: Conus extends to the L1 level. Conus and cauda equina appear normal. Paraspinal and other soft  tissues: Negative. Disc levels: T10-11: Minimal acute or subacute benign-appearing compression fracture of the superior endplate of H63. No disc bulging or protrusion. No protrusion of bone into the spinal canal. T11-12: Moderately severe benign-appearing compression fracture of T12. No protrusion of the T11-12 disc into the spinal canal. Slight protrusion of the posterior margin of T12 into the spinal canal without neural impingement. T12-L1: No bulging or protrusion of the disc into the spinal canal. The compression fracture of T12 involves the superior and inferior endplates as well as the anterior and posterior margins of the vertebra. No neural impingement. L1-2:  Normal. L2-3: Disc desiccation with slight disc space narrowing with a small  broad-based disc bulge without neural impingement. L3-4: Disc desiccation. Small broad-based disc protrusion slightly asymmetric to the right compressing the anterior aspect of the thecal sac but without focal neural impingement. L4-5: Small broad-based soft disc protrusion slightly asymmetric to the left. Combine with hypertrophy of the left ligamentum flavum the left lateral recess is encroached upon and could affect the left L5 nerve, best seen on image 36 of series 6. L5-S1: Small broad-based disc bulge. Small focal soft disc extrusion extending superiorly behind the body of L5 on the left best seen on images 40 and 41 of series 7. This could affect the left L5 nerve. IMPRESSION: 1. Acute or subacute benign-appearing compression fractures of T11 and T12. No neural impingement at either level. 2. Small broad-based soft disc protrusion at L4-5 with encroachment upon the left lateral recess which might affect the left L5 nerve. 3. Small soft disc extrusion to the left at L5-S1 above the disc space. This might affect the left L5 nerve. Electronically Signed   By: Lorriane Shire M.D.   On: 11/19/2017 10:52   US Venous Img Upper Uni Right  Result Date: 12/03/2017 CLINICAL DATA:  Right upper extremity bladder/right upper extremity edema for the past 3-4 days. History of right basilic approach upper extremity PICC line. History of bladder cancer. Evaluate for DVT. EXAM: RIGHT UPPER EXTREMITY VENOUS DOPPLER ULTRASOUND TECHNIQUE: Gray-scale sonography with graded compression, as well as color Doppler and duplex ultrasound were performed to evaluate the upper extremity deep venous system from the level of the subclavian vein and including the jugular, axillary, basilic, radial, ulnar and upper cephalic vein. Spectral Doppler was utilized to evaluate flow at rest and with distal augmentation maneuvers. COMPARISON:  None. FINDINGS: Contralateral Subclavian Vein: Respiratory phasicity is normal and symmetric with the  symptomatic side. No evidence of thrombus. Normal compressibility. Internal Jugular Vein: No evidence of thrombus. Normal compressibility, respiratory phasicity and response to augmentation. Subclavian Vein: No evidence of thrombus. Normal compressibility, respiratory phasicity and response to augmentation. A PICC line is seen within the right subclavian vein (images 10 and 19). Axillary Vein: No evidence of thrombus. Normal compressibility, respiratory phasicity and response to augmentation. A PICC line is seen within the right axillary vein (images 22 and 23). Cephalic Vein: No evidence of thrombus. Normal compressibility, respiratory phasicity and response to augmentation. A PICC line is seen within the right basilic vein (images 59 and 60). Basilic Vein: No evidence of thrombus. Normal compressibility, respiratory phasicity and response to augmentation. Brachial Veins: No evidence of thrombus. Normal compressibility, respiratory phasicity and response to augmentation. Radial Veins: No evidence of thrombus. Normal compressibility, respiratory phasicity and response to augmentation. Ulnar Veins: No evidence of thrombus. Normal compressibility, respiratory phasicity and response to augmentation. Venous Reflux:  None visualized. Other Findings: There is a moderate amount of subcutaneous  edema at the level of the elbow and forearm (images 63 and 64). IMPRESSION: No evidence of DVT within the right upper extremity. Specifically, there is no evidence of DVT associated with the right basilic vein approach PICC line. Electronically Signed   By: Sandi Mariscal M.D.   On: 12/03/2017 13:53   Dg Chest Port 1 View  Result Date: 12/05/2017 CLINICAL DATA:  Cough with tachycardia EXAM: PORTABLE CHEST 1 VIEW COMPARISON:  Chest radiograph and chest CT November 30, 2017 FINDINGS: Central catheter tip is in the superior vena cava near the cavoatrial junction. No evident pneumothorax. There is minimal atelectasis in the right base.  The lungs elsewhere are clear. Heart size and pulmonary vascular normal. No adenopathy. There is aortic atherosclerosis. There is degenerative change in each shoulder. IMPRESSION: Slight right base atelectasis. No edema or consolidation. Stable cardiac silhouette. There is aortic atherosclerosis. Central catheter as described without pneumothorax. Aortic Atherosclerosis (ICD10-I70.0). Electronically Signed   By: Lowella Grip III M.D.   On: 12/05/2017 10:29   Dg Chest Port 1 View  Result Date: 11/30/2017 CLINICAL DATA:  Shortness of Breath EXAM: PORTABLE CHEST 1 VIEW COMPARISON:  Chest radiograph November 16, 2017 and chest CT November 16, 2017 FINDINGS: There is scarring in the lateral right base. There is no edema or consolidation. The heart size and pulmonary vascularity are normal. No adenopathy. No evident bone lesions. IMPRESSION: Scarring lateral right base. No edema or consolidation. Stable cardiac silhouette. Electronically Signed   By: Lowella Grip III M.D.   On: 11/30/2017 12:58   Ct Angio Chest/abd/pel For Dissection W And/or Wo Contrast  Result Date: 11/16/2017 CLINICAL DATA:  Chest and low back pain, initial encounter EXAM: CT ANGIOGRAPHY CHEST, ABDOMEN AND PELVIS TECHNIQUE: Multidetector CT imaging through the chest, abdomen and pelvis was performed using the standard protocol during bolus administration of intravenous contrast. Multiplanar reconstructed images and MIPs were obtained and reviewed to evaluate the vascular anatomy. CONTRAST:  188m ISOVUE-370 IOPAMIDOL (ISOVUE-370) INJECTION 76% COMPARISON:  None. FINDINGS: CTA CHEST FINDINGS Cardiovascular: The thoracic aorta and its branches demonstrate mild atherosclerotic changes. No aneurysmal dilatation or dissection is identified. Coronary calcifications are seen. No cardiac enlargement is seen. The pulmonary artery as visualized shows no large central embolus. Mediastinum/Nodes: Thoracic inlet is within normal limits. No hilar  or mediastinal adenopathy is identified. The esophagus is within normal limits. Lungs/Pleura: Diffuse emphysematous changes are identified. Mild atelectatic changes are noted in the right lower lobe and lingula. No focal infiltrate or sizable parenchymal nodule is noted. No pleural effusion is seen. Musculoskeletal: There compression deformities of T5-T6 and T12 identified. These are new from June of 2018 but unchanged from the recent chest x-ray. They have a chronic appearance given significant sclerosis. There is also a fracture of the T5 spinous process identified. Review of the MIP images confirms the above findings. CTA ABDOMEN AND PELVIS FINDINGS VASCULAR Aorta: Diffuse atherosclerotic changes of the aorta are noted without aneurysmal dilatation. Celiac: Mild narrowing is noted proximally with poststenotic dilatation. Atherosclerotic changes are identified. SMA: Mild atherosclerotic changes are noted. No focal stenosis is seen. Renals: Proximal atherosclerotic changes are noted without focal stenosis. IMA: Patent without evidence of aneurysm, dissection, vasculitis or significant stenosis. Iliacs: Diffuse atherosclerotic changes are noted without aneurysmal dilatation or focal stenosis. Veins: No obvious venous abnormality within the limitations of this arterial phase study. Review of the MIP images confirms the above findings. NON-VASCULAR Hepatobiliary: Diffuse decreased attenuation is noted consistent with fatty infiltration. Gallbladder is well  distended without cholelithiasis. Pancreas: Unremarkable. No pancreatic ductal dilatation or surrounding inflammatory changes. Spleen: Normal in size without focal abnormality. Adrenals/Urinary Tract: Adrenal glands are within normal limits. Nonobstructing 2 mm stone is noted in the lower pole of the right kidney. Bladder is well distended. Stomach/Bowel: The appendix is within normal limits without inflammatory change. No obstructive or inflammatory changes in the  large and small bowel are noted. Lymphatic: No significant lymphadenopathy is noted. Reproductive: Prostatic calcifications are seen. Other: No abdominal wall hernia or abnormality. No abdominopelvic ascites. Musculoskeletal: Degenerative changes of lumbar spine are noted. No acute compression deformity is seen. Review of the MIP images confirms the above findings. IMPRESSION: No evidence of acute aortic abnormality. Diffuse atherosclerotic changes are noted. Diffuse emphysematous changes. Multiple compression deformities which appear chronic in nature as described. A chronic appearing T5 spinous process fracture is noted as well. Nonobstructing right renal stone. Chronic changes as described above. Electronically Signed   By: Inez Catalina M.D.   On: 11/16/2017 15:19      Subjective: Patient was seen and examined 12/11/2017, 1:30 PM Patient stable  Today. No acute distress.  No issues overnight Stable for discharge.  Discharge Exam:  Vitals:   12/10/17 2037 12/11/17 0553 12/11/17 0759 12/11/17 0804  BP: 117/69 124/72    Pulse: 91 97    Resp: 18 20    Temp: 97.8 F (36.6 C) 97.7 F (36.5 C)    TempSrc: Oral Oral    SpO2: 94% 97% 97% 97%  Weight:      Height:        General: Pt lying comfortably in bed & appears in no obvious distress. Cardiovascular: S1 & S2 heard, RRR, S1/S2 +. No murmurs, rubs, gallops or clicks. No JVD or pedal edema. Respiratory: Clear to auscultation without wheezing, rhonchi or crackles. No increased work of breathing. Abdominal:  Non distended, non tender & soft. No organomegaly or masses appreciated. Normal bowel sounds heard. CNS: Alert and oriented. No focal deficits. Extremities: no edema, no cyanosis    The results of significant diagnostics from this hospitalization (including imaging, microbiology, ancillary and laboratory) are listed below for reference.     Microbiology: No results found for this or any previous visit (from the past 240 hour(s)).     Labs: CBC: Recent Labs  Lab 12/06/17 0622 12/07/17 0502 12/10/17 0623  WBC 11.6* 12.4* 11.7*  HGB 10.9* 10.5* 10.4*  HCT 31.5* 30.7* 31.8*  MCV 90.0 90.8 94.1  PLT 203 197 782   Basic Metabolic Panel: Recent Labs  Lab 12/05/17 0540  12/06/17 0713 12/07/17 0502 12/08/17 0413 12/09/17 9562 12/10/17 0623 12/11/17 0612  NA 120*  120*   < >  --  124* 125* 128* 129* 131*  K 3.6  3.6   < >  --  3.1* 3.5 4.1 4.0 3.7  CL 83*  83*   < >  --  85* 84* 86* 88* 89*  CO2 28  27   < >  --  31 32 33* 32 30  GLUCOSE 129*  130*   < >  --  151* 163* 167* 157* 156*  BUN 18  18   < >  --  18 21* 33* 38* 45*  CREATININE 0.59*  0.55*   < >  --  0.58* 0.52* 0.59* 0.62 0.52*  CALCIUM 8.0*  7.9*   < >  --  7.9* 8.1* 8.2* 7.9* 8.0*  MG  --   --  1.7 1.6* 1.8  --   --   --  PHOS 2.2*  --   --   --   --   --   --   --    < > = values in this interval not displayed.   Liver Function Tests: Recent Labs  Lab 12/05/17 0540  ALBUMIN 2.7*   BNP (last 3 results) Recent Labs    11/16/17 1058  BNP 54.0   Cardiac Enzymes: No results for input(s): CKTOTAL, CKMB, CKMBINDEX, TROPONINI in the last 168 hours. CBG: Recent Labs  Lab 12/10/17 1140 12/10/17 1824 12/10/17 2040 12/11/17 0746 12/11/17 1147  GLUCAP 161* 158* 152* 144* 174*   Hgb A1c No results for input(s): HGBA1C in the last 72 hours. Lipid Profile No results for input(s): CHOL, HDL, LDLCALC, TRIG, CHOLHDL, LDLDIRECT in the last 72 hours. Thyroid function studies No results for input(s): TSH, T4TOTAL, T3FREE, THYROIDAB in the last 72 hours.  Invalid input(s): FREET3 Anemia work up No results for input(s): VITAMINB12, FOLATE, FERRITIN, TIBC, IRON, RETICCTPCT in the last 72 hours. Urinalysis    Component Value Date/Time   COLORURINE YELLOW 11/30/2017 2140   APPEARANCEUR CLEAR 11/30/2017 2140   LABSPEC 1.030 11/30/2017 2140   PHURINE 5.0 11/30/2017 2140   GLUCOSEU NEGATIVE 11/30/2017 2140   HGBUR SMALL (A)  11/30/2017 2140   BILIRUBINUR NEGATIVE 11/30/2017 2140   KETONESUR 20 (A) 11/30/2017 2140   PROTEINUR NEGATIVE 11/30/2017 2140   UROBILINOGEN 0.2 03/13/2014 1310   NITRITE NEGATIVE 11/30/2017 2140   LEUKOCYTESUR NEGATIVE 11/30/2017 2140      Time coordinating discharge: Over 30 minutes  SIGNED: Deatra James, MD, FACP, FHM. [facility] Pager [phone]  If 7PM-7AM, please contact night-coverage [email or web address] Password [email or web address] 12/11/2017, 1:30 PM

## 2017-12-11 NOTE — Progress Notes (Signed)
EMS arrived to transport patient to [facility].

## 2017-12-11 NOTE — Care Management Note (Signed)
Case Management Note  Patient Details  Name: Willie Williamson MRN: [id number] Date of Birth: 21-Oct-1931   If discussed at Long Length of Stay Meetings, dates discussed:  12/11/2017  Additional Comments:  Hindy Perrault, Chauncey Reading, RN 12/11/2017, 12:04 PM

## 2017-12-12 DIAGNOSIS — J441 Chronic obstructive pulmonary disease with (acute) exacerbation: Secondary | ICD-10-CM | POA: Diagnosis not present

## 2017-12-12 DIAGNOSIS — E871 Hypo-osmolality and hyponatremia: Secondary | ICD-10-CM | POA: Diagnosis not present

## 2017-12-12 DIAGNOSIS — I5031 Acute diastolic (congestive) heart failure: Secondary | ICD-10-CM | POA: Diagnosis not present

## 2017-12-12 DIAGNOSIS — E039 Hypothyroidism, unspecified: Secondary | ICD-10-CM | POA: Diagnosis not present

## 2017-12-17 DIAGNOSIS — R627 Adult failure to thrive: Secondary | ICD-10-CM | POA: Diagnosis not present

## 2017-12-17 DIAGNOSIS — Z515 Encounter for palliative care: Secondary | ICD-10-CM | POA: Diagnosis not present

## 2017-12-17 DIAGNOSIS — R7881 Bacteremia: Secondary | ICD-10-CM | POA: Diagnosis not present

## 2017-12-18 ENCOUNTER — Ambulatory Visit: Payer: Medicare HMO | Admitting: Cardiovascular Disease

## 2017-12-18 DIAGNOSIS — J441 Chronic obstructive pulmonary disease with (acute) exacerbation: Secondary | ICD-10-CM | POA: Diagnosis not present

## 2017-12-18 DIAGNOSIS — E039 Hypothyroidism, unspecified: Secondary | ICD-10-CM | POA: Diagnosis not present

## 2017-12-18 DIAGNOSIS — I5031 Acute diastolic (congestive) heart failure: Secondary | ICD-10-CM | POA: Diagnosis not present

## 2017-12-18 DIAGNOSIS — E871 Hypo-osmolality and hyponatremia: Secondary | ICD-10-CM | POA: Diagnosis not present

## 2017-12-19 DIAGNOSIS — R627 Adult failure to thrive: Secondary | ICD-10-CM | POA: Diagnosis not present

## 2017-12-19 DIAGNOSIS — Z515 Encounter for palliative care: Secondary | ICD-10-CM | POA: Diagnosis not present

## 2017-12-21 DIAGNOSIS — R7881 Bacteremia: Secondary | ICD-10-CM | POA: Diagnosis not present

## 2017-12-21 DIAGNOSIS — E440 Moderate protein-calorie malnutrition: Secondary | ICD-10-CM | POA: Diagnosis not present

## 2017-12-21 DIAGNOSIS — R627 Adult failure to thrive: Secondary | ICD-10-CM | POA: Diagnosis not present

## 2017-12-21 DIAGNOSIS — J441 Chronic obstructive pulmonary disease with (acute) exacerbation: Secondary | ICD-10-CM | POA: Diagnosis not present

## 2017-12-21 DIAGNOSIS — I5031 Acute diastolic (congestive) heart failure: Secondary | ICD-10-CM | POA: Diagnosis not present

## 2017-12-21 DIAGNOSIS — R269 Unspecified abnormalities of gait and mobility: Secondary | ICD-10-CM | POA: Diagnosis not present

## 2017-12-21 DIAGNOSIS — M6281 Muscle weakness (generalized): Secondary | ICD-10-CM | POA: Diagnosis not present

## 2017-12-21 DIAGNOSIS — Z515 Encounter for palliative care: Secondary | ICD-10-CM | POA: Diagnosis not present

## 2017-12-21 DIAGNOSIS — B9562 Methicillin resistant Staphylococcus aureus infection as the cause of diseases classified elsewhere: Secondary | ICD-10-CM | POA: Diagnosis not present

## 2017-12-21 DIAGNOSIS — E871 Hypo-osmolality and hyponatremia: Secondary | ICD-10-CM | POA: Diagnosis not present

## 2017-12-21 DIAGNOSIS — J9611 Chronic respiratory failure with hypoxia: Secondary | ICD-10-CM | POA: Diagnosis not present

## 2017-12-21 DIAGNOSIS — R1312 Dysphagia, oropharyngeal phase: Secondary | ICD-10-CM | POA: Diagnosis not present

## 2017-12-21 DIAGNOSIS — J449 Chronic obstructive pulmonary disease, unspecified: Secondary | ICD-10-CM | POA: Diagnosis not present

## 2017-12-21 DIAGNOSIS — I330 Acute and subacute infective endocarditis: Secondary | ICD-10-CM | POA: Diagnosis not present

## 2017-12-24 DIAGNOSIS — R627 Adult failure to thrive: Secondary | ICD-10-CM | POA: Diagnosis not present

## 2017-12-24 DIAGNOSIS — Z515 Encounter for palliative care: Secondary | ICD-10-CM | POA: Diagnosis not present

## 2018-01-14 ENCOUNTER — Inpatient Hospital Stay: Payer: Medicare HMO | Admitting: Infectious Disease

## 2018-01-18 DEATH — deceased

## 2018-09-19 IMAGING — CT CT ANGIO CHEST
2 of 6 series · 18 of 46 positions shown · IV contrast (Isovue)
Comparison: 11/16/2017

CLINICAL DATA: Tachypnea, history coronary artery disease, COPD,
hypertension, bladder cancer

EXAM:
CT ANGIOGRAPHY CHEST WITH CONTRAST
TECHNIQUE: Multidetector CT imaging of the chest was performed using the
standard protocol during bolus administration of intravenous
contrast. Multiplanar CT image reconstructions and MIPs were
obtained to evaluate the vascular anatomy.
CONTRAST:  100mL LDNWRQ-JHH IOPAMIDOL (LDNWRQ-JHH) INJECTION 76% IV

[Series 5: thins · axial · 0.75mm/px · z∈[+1177,+1509]mm · 15 of 364 slices shown]
[im 16/364  lung]
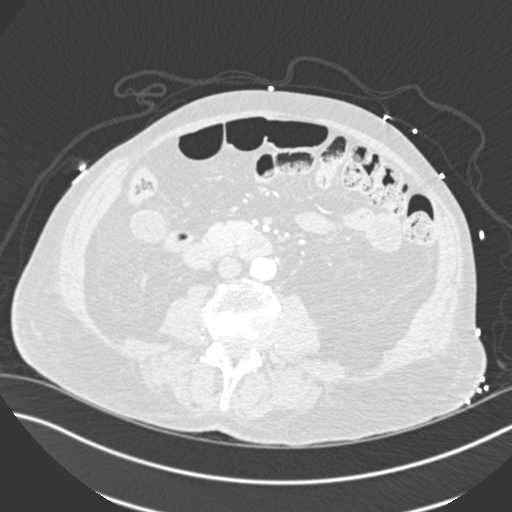
[im 48/364  soft-tissue]
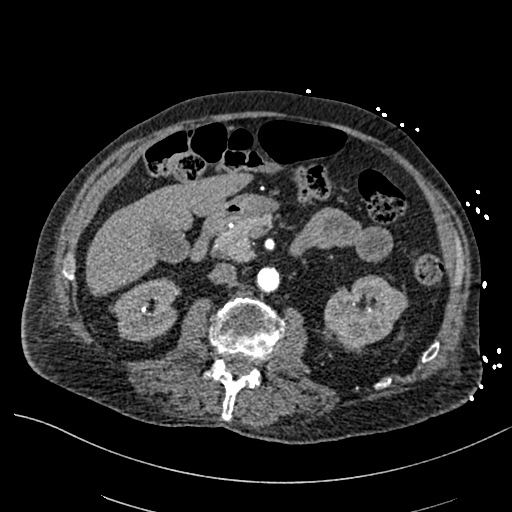
[im 64/364  lung]
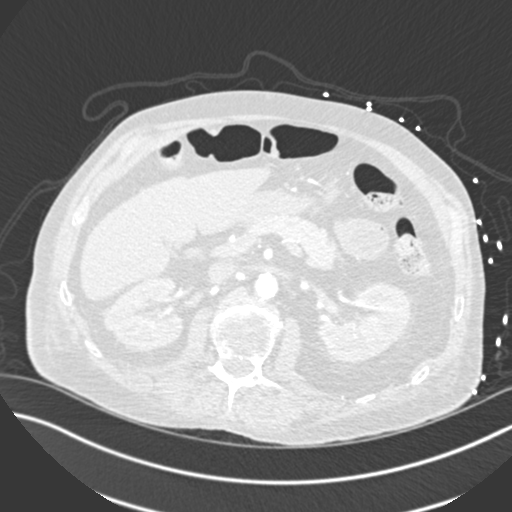
[im 95/364  soft-tissue]
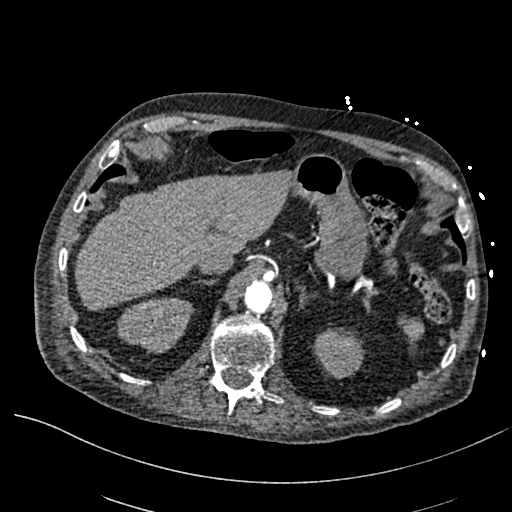
[im 111/364  lung]
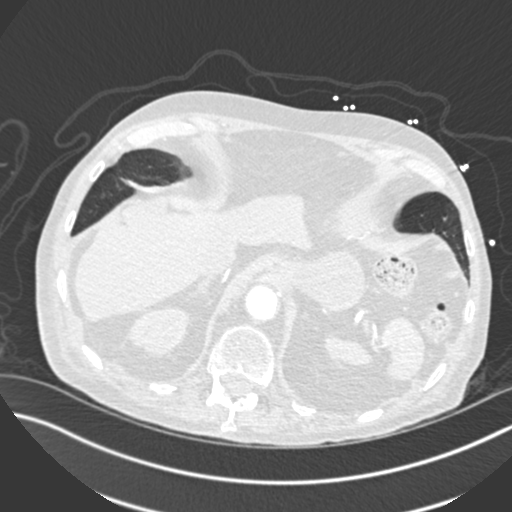
[im 143/364  soft-tissue]
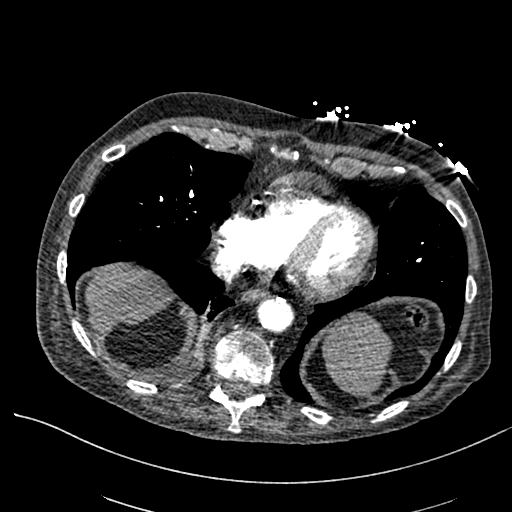
[im 158/364  lung]
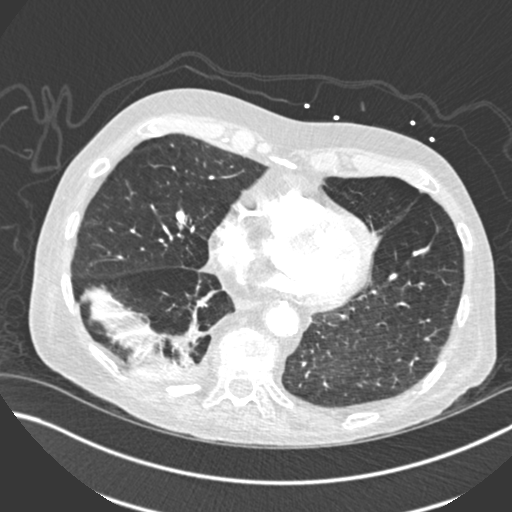
[im 190/364  soft-tissue]
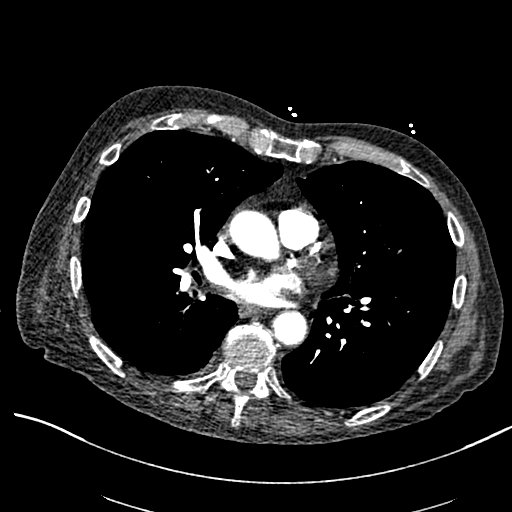
[im 206/364  lung]
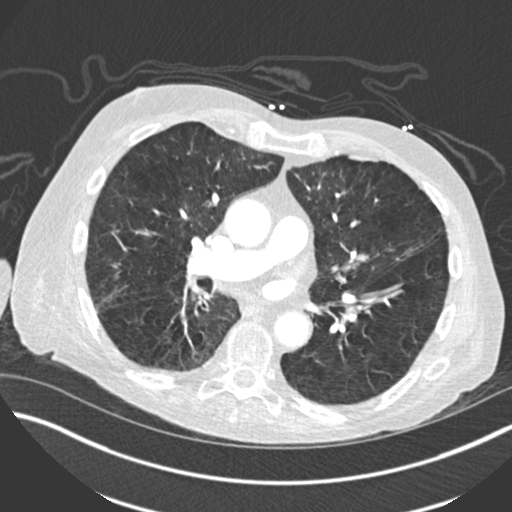
[im 221/364  soft-tissue]
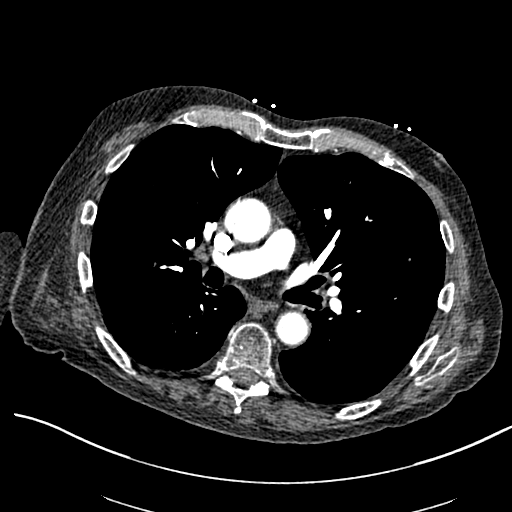
[im 253/364  lung]
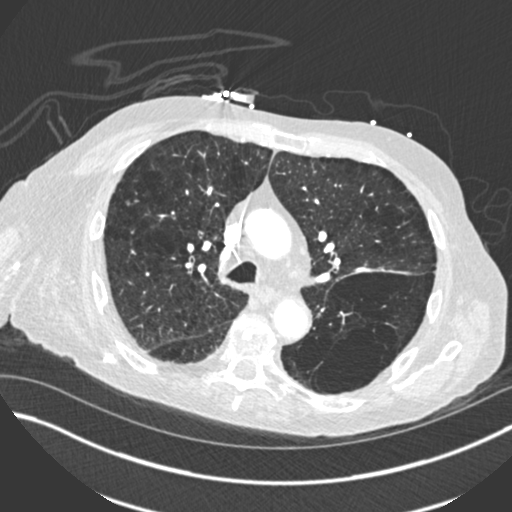
[im 269/364  soft-tissue]
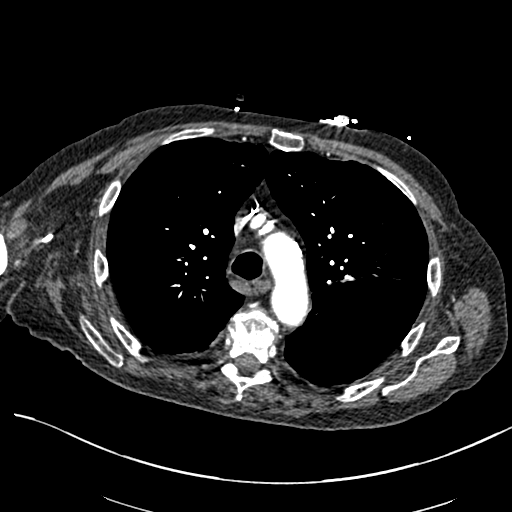
[im 300/364  lung]
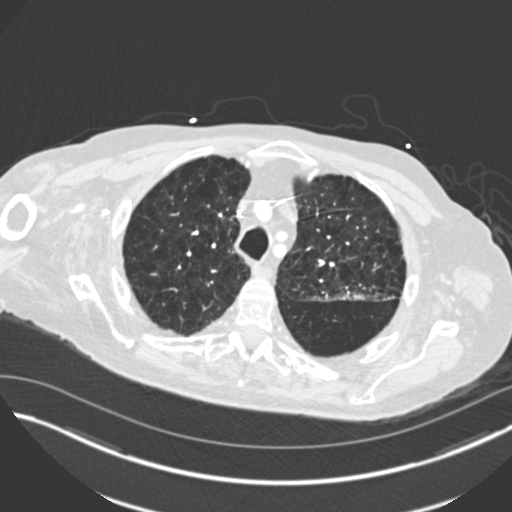
[im 316/364  soft-tissue]
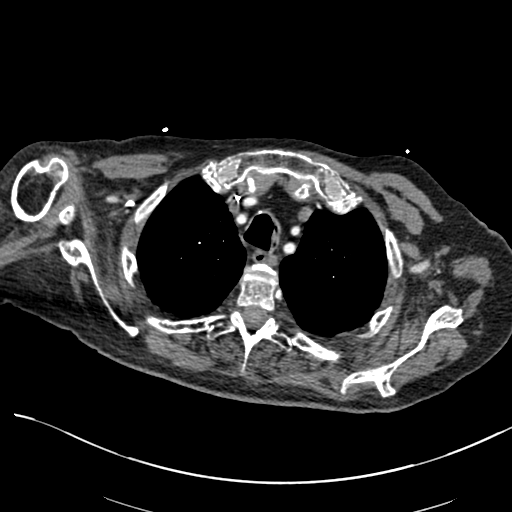
[im 348/364  lung]
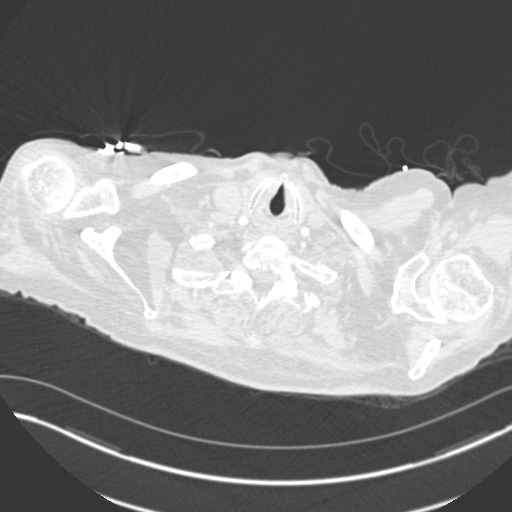

[Series 7: coronal mpr · coronal · 0.74mm/px · 3 of 150 slices shown]
[im 38/150  soft-tissue]
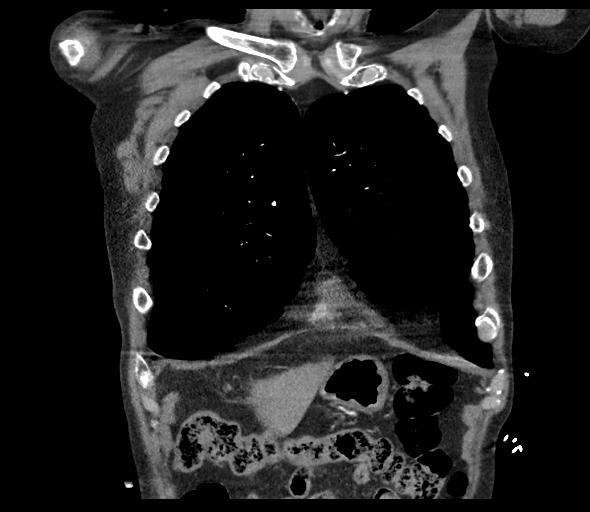
[im 75/150  soft-tissue]
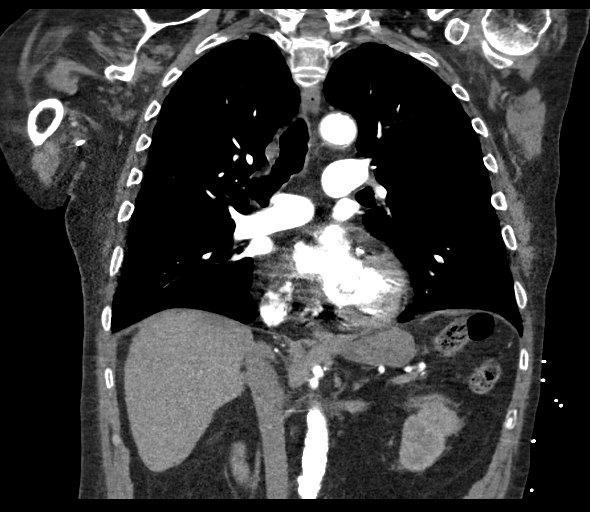
[im 112/150  soft-tissue]
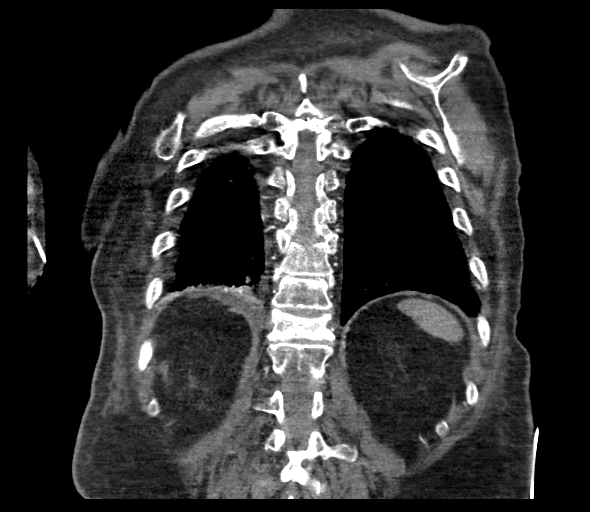

[18 of 46 positions shown; findings below may reference images not displayed]

FINDINGS: Cardiovascular: Atherosclerotic calcifications aorta, proximal great
vessels and coronary arteries. Aorta normal caliber without aneurysm
or dissection. Small pericardial effusion. Pulmonary arteries well
opacified and patent. No evidence of pulmonary embolism. Narrowing
at origin of celiac artery.

Mediastinum/Nodes: Esophagus unremarkable. No thoracic adenopathy.
Base of cervical region normal appearance.

Lungs/Pleura: Emphysematous changes with mild central peribronchial
thickening. Subsegmental atelectasis RIGHT lower lobe and minimally
in lingula. No acute infiltrate, pleural effusion, or pneumothorax.

Upper Abdomen: Small nonobstructing calculus lower pole RIGHT
kidney. Remaining visualized upper abdomen unremarkable

Musculoskeletal: Bones demineralized. Compression fractures of
multiple vertebral bodies including T5, T6, T12 and superior
endplate L2..

Review of the MIP images confirms the above findings.
IMPRESSION: No evidence of pulmonary embolism.

Small pericardial effusion.

COPD changes with subsegmental atelectasis in RIGHT lower lobe and
minimally in lingula.

Multiple thoracolumbar compression fractures.

Aortic Atherosclerosis ([id number].F) and Emphysema ([id number]).

## 2018-09-19 IMAGING — CR DG CHEST 1V PORT
1 series · 1 of 1 positions shown · non-contrast
Comparison: Chest radiograph November 16, 2017 and chest CT
November 16, 2017

CLINICAL DATA: Shortness of Breath

EXAM:
PORTABLE CHEST 1 VIEW

[portable]
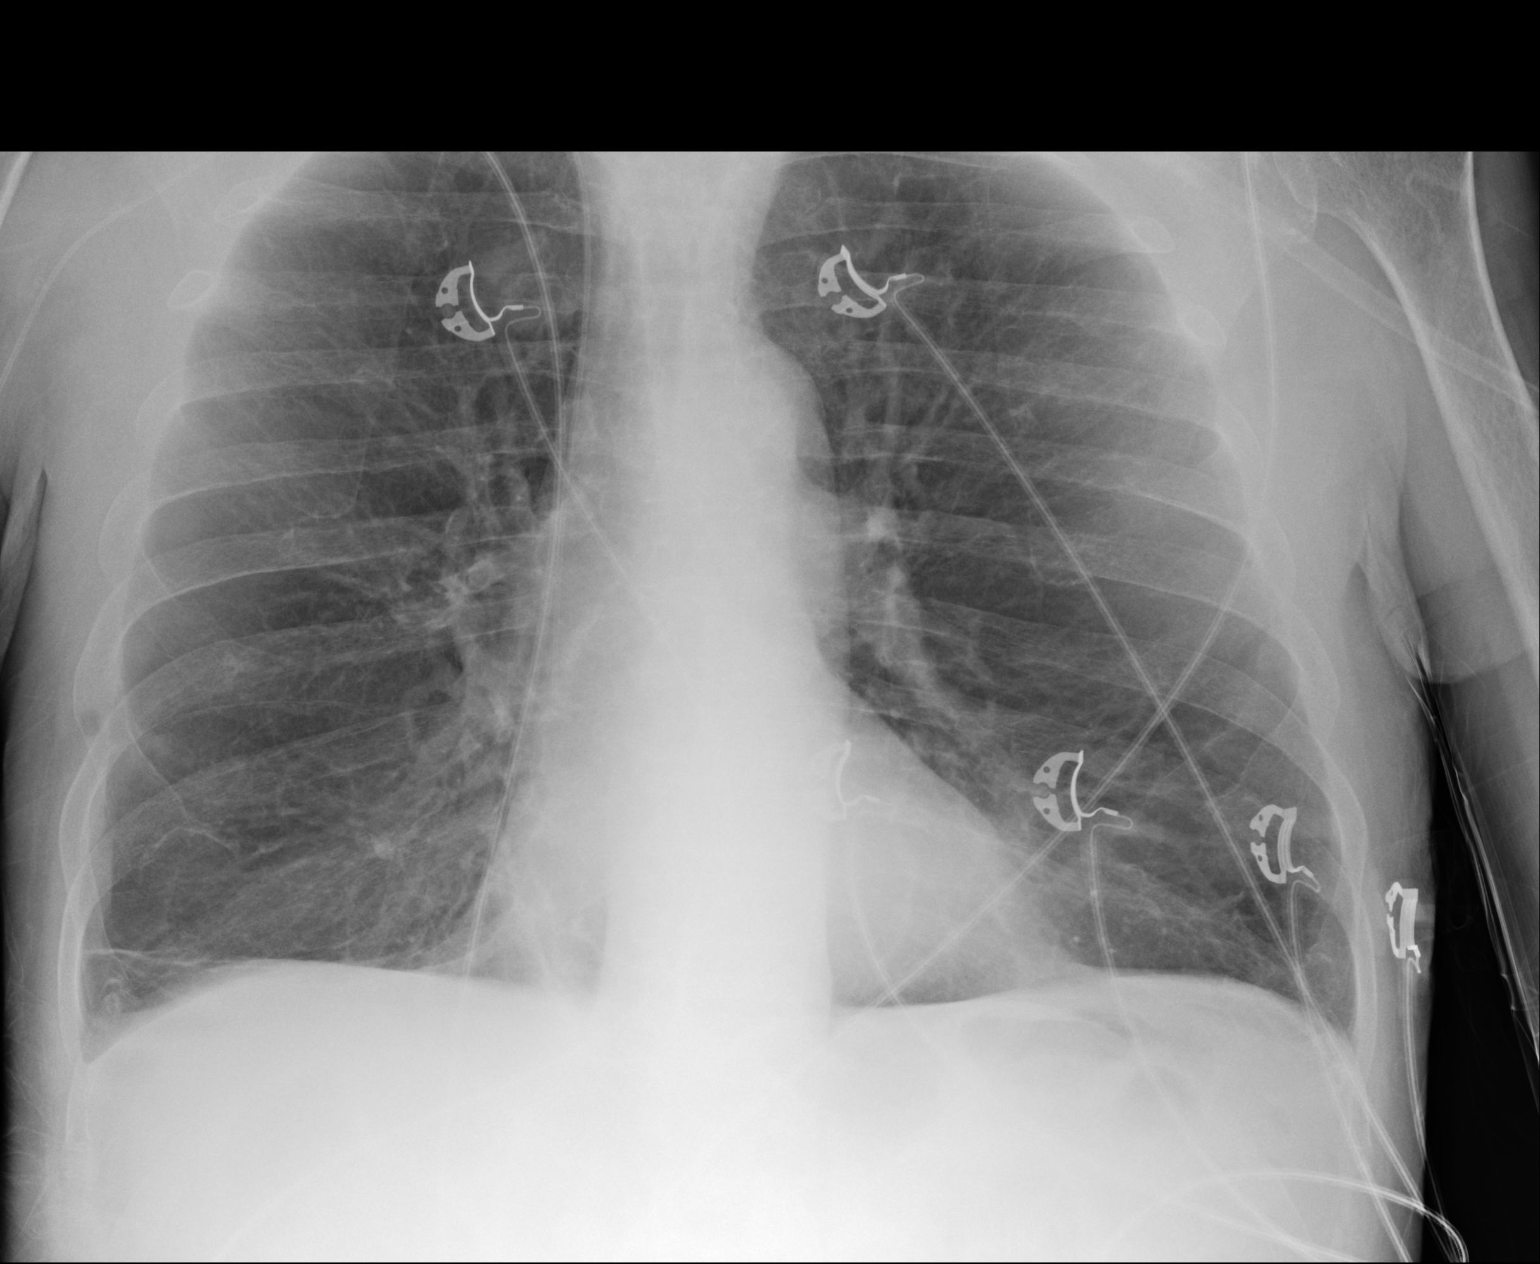

[1 of 1 positions shown; findings below may reference images not displayed]

FINDINGS: There is scarring in the lateral right base. There is no edema or
consolidation. The heart size and pulmonary vascularity are normal.
No adenopathy. No evident bone lesions.
IMPRESSION: Scarring lateral right base. No edema or consolidation. Stable
cardiac silhouette.

## 2018-10-01 ENCOUNTER — Telehealth: Payer: Self-pay | Admitting: Cardiovascular Disease

## 2018-10-01 NOTE — Telephone Encounter (Signed)
Numerous attempts to contact patient with recall letters. Unable to reach by telephone. with no success.   Weston Anna [[id number]] 12/12/2016 12:03 PM New [10]    [System] 08/20/2017 11:03 PM Notification Sent [20]   Delfino Lovett T [[id number]] 09/18/2017 1:35 PM Scheduled/Linked [30]   Acquanetta Chain, LPN [[id number]] 03/22/8881 12:34 PM Notification Sent [20]   Delfino Lovett T [[id number]] 10/01/2018 2:02 PM Notification Sent [20]
